# Patient Record
Sex: Female | Born: 1955 | Race: White | Hispanic: No | Marital: Married | Smoking: Current every day smoker
Health system: Southern US, Community
[De-identification: ages and names within clinical notes are randomized; demographics above are authoritative.]

## PROBLEM LIST (undated history)

## (undated) DIAGNOSIS — I700 Atherosclerosis of aorta: Secondary | ICD-10-CM

## (undated) DIAGNOSIS — E785 Hyperlipidemia, unspecified: Secondary | ICD-10-CM

## (undated) DIAGNOSIS — J439 Emphysema, unspecified: Secondary | ICD-10-CM

## (undated) DIAGNOSIS — M1990 Unspecified osteoarthritis, unspecified site: Secondary | ICD-10-CM

## (undated) DIAGNOSIS — F17200 Nicotine dependence, unspecified, uncomplicated: Secondary | ICD-10-CM

## (undated) DIAGNOSIS — Z8489 Family history of other specified conditions: Secondary | ICD-10-CM

## (undated) DIAGNOSIS — H539 Unspecified visual disturbance: Secondary | ICD-10-CM

## (undated) DIAGNOSIS — F419 Anxiety disorder, unspecified: Secondary | ICD-10-CM

## (undated) DIAGNOSIS — D472 Monoclonal gammopathy: Secondary | ICD-10-CM

## (undated) DIAGNOSIS — R112 Nausea with vomiting, unspecified: Secondary | ICD-10-CM

## (undated) DIAGNOSIS — F329 Major depressive disorder, single episode, unspecified: Secondary | ICD-10-CM

## (undated) DIAGNOSIS — I341 Nonrheumatic mitral (valve) prolapse: Secondary | ICD-10-CM

## (undated) DIAGNOSIS — F3341 Major depressive disorder, recurrent, in partial remission: Secondary | ICD-10-CM

## (undated) DIAGNOSIS — I10 Essential (primary) hypertension: Secondary | ICD-10-CM

## (undated) DIAGNOSIS — G4730 Sleep apnea, unspecified: Secondary | ICD-10-CM

## (undated) DIAGNOSIS — M5090 Cervical disc disorder, unspecified, unspecified cervical region: Secondary | ICD-10-CM

## (undated) DIAGNOSIS — Z9889 Other specified postprocedural states: Secondary | ICD-10-CM

## (undated) DIAGNOSIS — D890 Polyclonal hypergammaglobulinemia: Principal | ICD-10-CM

## (undated) DIAGNOSIS — T8859XA Other complications of anesthesia, initial encounter: Secondary | ICD-10-CM

## (undated) DIAGNOSIS — R7303 Prediabetes: Secondary | ICD-10-CM

## (undated) DIAGNOSIS — K219 Gastro-esophageal reflux disease without esophagitis: Secondary | ICD-10-CM

## (undated) DIAGNOSIS — T4145XA Adverse effect of unspecified anesthetic, initial encounter: Secondary | ICD-10-CM

## (undated) DIAGNOSIS — F32A Depression, unspecified: Secondary | ICD-10-CM

## (undated) DIAGNOSIS — G43909 Migraine, unspecified, not intractable, without status migrainosus: Secondary | ICD-10-CM

## (undated) HISTORY — DX: Nicotine dependence, unspecified, uncomplicated: F17.200

## (undated) HISTORY — DX: Polyclonal hypergammaglobulinemia: D89.0

## (undated) HISTORY — DX: Cervical disc disorder, unspecified, unspecified cervical region: M50.90

## (undated) HISTORY — PX: CARPAL TUNNEL RELEASE: SHX101

## (undated) HISTORY — DX: Major depressive disorder, recurrent, in partial remission: F33.41

## (undated) HISTORY — DX: Atherosclerosis of aorta: I70.0

## (undated) HISTORY — PX: TUBAL LIGATION: SHX77

## (undated) HISTORY — DX: Sleep apnea, unspecified: G47.30

## (undated) HISTORY — DX: Emphysema, unspecified: J43.9

## (undated) HISTORY — DX: Major depressive disorder, single episode, unspecified: F32.9

## (undated) HISTORY — DX: Prediabetes: R73.03

## (undated) HISTORY — PX: BREAST BIOPSY: SHX20

## (undated) HISTORY — DX: Monoclonal gammopathy: D47.2

## (undated) HISTORY — DX: Unspecified visual disturbance: H53.9

## (undated) HISTORY — PX: TONSILECTOMY, ADENOIDECTOMY, BILATERAL MYRINGOTOMY AND TUBES: SHX2538

## (undated) HISTORY — DX: Depression, unspecified: F32.A

## (undated) HISTORY — DX: Hyperlipidemia, unspecified: E78.5

## (undated) HISTORY — DX: Unspecified osteoarthritis, unspecified site: M19.90

## (undated) HISTORY — DX: Gastro-esophageal reflux disease without esophagitis: K21.9

## (undated) HISTORY — DX: Migraine, unspecified, not intractable, without status migrainosus: G43.909

## (undated) HISTORY — PX: KNEE ARTHROSCOPY: SHX127

## (undated) HISTORY — DX: Nonrheumatic mitral (valve) prolapse: I34.1

## (undated) HISTORY — DX: Essential (primary) hypertension: I10

## (undated) HISTORY — PX: MENISCUS REPAIR: SHX5179

## (undated) HISTORY — PX: BUNIONECTOMY: SHX129

## (undated) HISTORY — PX: OTHER SURGICAL HISTORY: SHX169

---
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
## 1980-07-07 DIAGNOSIS — I341 Nonrheumatic mitral (valve) prolapse: Secondary | ICD-10-CM

## 1980-07-07 HISTORY — DX: Nonrheumatic mitral (valve) prolapse: I34.1

## 1984-07-07 HISTORY — PX: BREAST EXCISIONAL BIOPSY: SUR124

## 1996-07-07 HISTORY — PX: BREAST EXCISIONAL BIOPSY: SUR124

## 2000-03-30 ENCOUNTER — Encounter: Payer: Self-pay | Admitting: Family Medicine

## 2000-03-30 ENCOUNTER — Ambulatory Visit (HOSPITAL_COMMUNITY): Admission: RE | Admit: 2000-03-30 | Discharge: 2000-03-30 | Payer: Self-pay | Admitting: Family Medicine

## 2000-06-05 ENCOUNTER — Ambulatory Visit (HOSPITAL_COMMUNITY): Admission: RE | Admit: 2000-06-05 | Discharge: 2000-06-05 | Payer: Self-pay | Admitting: Gastroenterology

## 2001-05-04 ENCOUNTER — Other Ambulatory Visit: Admission: RE | Admit: 2001-05-04 | Discharge: 2001-05-04 | Payer: Self-pay | Admitting: Internal Medicine

## 2002-04-13 ENCOUNTER — Encounter: Payer: Self-pay | Admitting: Internal Medicine

## 2002-04-13 ENCOUNTER — Encounter: Admission: RE | Admit: 2002-04-13 | Discharge: 2002-04-13 | Payer: Self-pay | Admitting: Internal Medicine

## 2002-07-02 ENCOUNTER — Encounter: Payer: Self-pay | Admitting: Internal Medicine

## 2002-07-02 ENCOUNTER — Encounter: Admission: RE | Admit: 2002-07-02 | Discharge: 2002-07-02 | Payer: Self-pay | Admitting: Internal Medicine

## 2002-08-11 ENCOUNTER — Emergency Department (HOSPITAL_COMMUNITY): Admission: EM | Admit: 2002-08-11 | Discharge: 2002-08-11 | Payer: Self-pay | Admitting: Emergency Medicine

## 2002-08-11 ENCOUNTER — Encounter: Payer: Self-pay | Admitting: Emergency Medicine

## 2003-12-21 ENCOUNTER — Other Ambulatory Visit: Admission: RE | Admit: 2003-12-21 | Discharge: 2003-12-21 | Payer: Self-pay | Admitting: Obstetrics and Gynecology

## 2004-05-07 HISTORY — PX: TOTAL VAGINAL HYSTERECTOMY: SHX2548

## 2004-05-27 ENCOUNTER — Encounter (INDEPENDENT_AMBULATORY_CARE_PROVIDER_SITE_OTHER): Payer: Self-pay | Admitting: Specialist

## 2004-05-27 ENCOUNTER — Observation Stay (HOSPITAL_COMMUNITY): Admission: RE | Admit: 2004-05-27 | Discharge: 2004-05-28 | Payer: Self-pay | Admitting: Obstetrics and Gynecology

## 2004-09-26 ENCOUNTER — Encounter: Admission: RE | Admit: 2004-09-26 | Discharge: 2004-12-25 | Payer: Self-pay | Admitting: Internal Medicine

## 2004-10-17 ENCOUNTER — Encounter: Admission: RE | Admit: 2004-10-17 | Discharge: 2004-10-17 | Payer: Self-pay | Admitting: Internal Medicine

## 2004-12-05 HISTORY — PX: CERVICAL DISC SURGERY: SHX588

## 2004-12-06 ENCOUNTER — Encounter: Admission: RE | Admit: 2004-12-06 | Discharge: 2004-12-06 | Payer: Self-pay | Admitting: Neurosurgery

## 2004-12-18 ENCOUNTER — Ambulatory Visit (HOSPITAL_COMMUNITY): Admission: RE | Admit: 2004-12-18 | Discharge: 2004-12-19 | Payer: Self-pay | Admitting: Neurosurgery

## 2005-01-25 ENCOUNTER — Encounter: Admission: RE | Admit: 2005-01-25 | Discharge: 2005-01-25 | Payer: Self-pay | Admitting: Neurosurgery

## 2005-02-12 ENCOUNTER — Ambulatory Visit (HOSPITAL_BASED_OUTPATIENT_CLINIC_OR_DEPARTMENT_OTHER): Admission: RE | Admit: 2005-02-12 | Discharge: 2005-02-12 | Payer: Self-pay | Admitting: Orthopedic Surgery

## 2005-02-12 ENCOUNTER — Ambulatory Visit (HOSPITAL_COMMUNITY): Admission: RE | Admit: 2005-02-12 | Discharge: 2005-02-12 | Payer: Self-pay | Admitting: Orthopedic Surgery

## 2005-05-27 ENCOUNTER — Encounter: Admission: RE | Admit: 2005-05-27 | Discharge: 2005-08-25 | Payer: Self-pay | Admitting: Neurosurgery

## 2006-04-21 ENCOUNTER — Other Ambulatory Visit: Admission: RE | Admit: 2006-04-21 | Discharge: 2006-04-21 | Payer: Self-pay | Admitting: Obstetrics and Gynecology

## 2006-04-21 LAB — HM PAP SMEAR: HM Pap smear: NORMAL

## 2008-06-16 ENCOUNTER — Encounter: Admission: RE | Admit: 2008-06-16 | Discharge: 2008-06-16 | Payer: Self-pay | Admitting: Internal Medicine

## 2010-11-22 NOTE — Op Note (Signed)
NAMELESSLY, STIGLER           ACCOUNT NO.:  [id number]   MEDICAL RECORD NO.:  [id number]          PATIENT TYPE:  OBV   LOCATION:  9399                          FACILITY:  [facility]   PHYSICIAN:  Cynthia P. Romine, M.D.DATE OF BIRTH:  [birth year]   DATE OF PROCEDURE:  05/27/2004  DATE OF DISCHARGE:                                 OPERATIVE REPORT   PREOPERATIVE DIAGNOSIS:  Uterine prolapse.   POSTOPERATIVE DIAGNOSIS:  Uterine prolapse, pathology pending.   PROCEDURE:  Total vaginal hysterectomy.   SURGEON:  Cynthia P. Romine, M.D.   ASSISTANT:  Andres Ege, M.D.   ANESTHESIA:  General endotracheal.   ESTIMATED BLOOD LOSS:  50 mL.   COMPLICATIONS:  None.   PROCEDURE:  The patient was taken to the operating room and after the  induction of adequate general anesthesia was placed in a dorsal lithotomy  position and prepped and draped in the usual fashion.  The bladder was  drained with a red rubber catheter.  A posterior weighted and anterior Sims  retractor were placed and the cervix was grasped on its anterior lip with a  Jacobs tenaculum.  The mucosa was infiltrated with approximately 8 mL of 1%  Xylocaine with epinephrine.  A knife was used to incise the mucosa off the  cervix and the bladder was moved anteriorly using sharp and blunt  dissection.  Attention was next turned posteriorly.  A posterior colpotomy  incision was made and the Bonnano retractor was placed into the posterior  peritoneal space.  The uterosacral ligaments were clamped, cut, and tied on  each side.  Attention was next turned anteriorly.  The anterior peritoneum  was identified and initial incision into the peritoneum revealed it not to  be into the peritoneal cavity.  Re-evaluation revealed the true fold, and it  was elevated and the anterior cavity was entered without difficulty.  The  Deaver retractor was placed.  The cardinal ligaments were then clamped, cut,  and tied using Heaney clamps and 0  chromic.  The pedicles containing the  uterine arteries were clamped, cut, and tied.  The fundus was then delivered  posteriorly and the pedicle containing the utero-ovarian ligament to the  round ligament was clamped, cut, and doubly tied.  The tubes had been  previously ligated and the proximal end of each tube was noted to be with a  slight hydrosalpinx, so the proximal end of the tube was included in the  pedicle and excised on each side.  The vaginal cuff was then run from 2  o'clock to 10 o'clock with a running locking suture of 0 chromic.  An anti-  enterocele stitch was then placed, pulling together the two uterosacral  ligaments and tied.  The wound was then irrigated and felt to be hemostatic.  The bladder was filled with sterile saline tinted with indigo carmine to  assure that the bladder was intact, and it was.  There was no leaking of  blue fluid into the wound, and the same amount of fluid that was put in was  taken out through the Foley at the completion of  irrigation.  The wound was then irrigated again with sterile saline.  The  cuff was closed with interrupted figure-of-eight sutures using 0 chromic.  The cuff was again irrigated and the procedure was terminated.  The  instruments were removed from the vagina, and the patient was taken to the  recovery room in satisfactory condition.      CPR/MEDQ  D:  05/27/2004  T:  05/27/2004  Job:  [id number]

## 2010-11-22 NOTE — Op Note (Signed)
[name], [name]           ACCOUNT NO.:  [id number]   MEDICAL RECORD NO.:  [id number]          PATIENT TYPE:  OIB   LOCATION:  3004                         FACILITY:  [facility]   PHYSICIAN:  Coletta Memos, M.D.     DATE OF BIRTH:  February 16, 1956   DATE OF PROCEDURE:  12/18/2004  DATE OF DISCHARGE:  12/19/2004                                 OPERATIVE REPORT   PREOPERATIVE DIAGNOSES:  1.  Cervical spondylosis.  2.  C5-6 cervical degenerative disk disease, C5-6.   POSTOPERATIVE DIAGNOSES:  1.  Cervical spondylosis.  2.  C5-6 cervical degenerative disk disease, C5-6.   PROCEDURES:  1.  Anterior cervical decompression, C5-6.  2.  Arthrodesis, C5-6, with 7-mm allograft anterior plating, ACCS plate.   COMPLICATIONS:  None.   DISCHARGE STATUS:  Alive and well.   SURGEON:  Coletta Memos, M.D.   ASSISTANT:  None.   INDICATIONS:  [name] is a 55 year old woman who presented with a  long history of pain in the cervical spine secondary to what I believe was  significant degenerative disk disease at C5-6.  She also had left arm pain  and a cervical radiculopathy.  I recommended and she agreed to undergo  operative decompression.   OPERATIVE NOTE:  [name] was brought to the operating room, intubated  and placed under general anesthetic without difficulty.  Her head was placed  in slight extension on a horseshoe headrest.  No traction was used.  Her neck was prepped, and she was draped in a sterile fashion.  I  infiltrated 0.5% lidocaine and 1:200,000 epinephrine starting in the midline  and extended through the medial border of the left sternocleidomastoid.  I  opened the skin with a #10 blade and took this down to the platysma.  I  dissected superior to the platysma rostrally and caudally.  I then opened  the platysma in a horizontal fashion.  I then dissected inferior to platysma  rostrally and caudally.  I created an avascular path to the anterior  cervical spine.  I  placed spinal needle and showed the disk space.  Then  using that as a guide, I was able moved to the correct disk space at the C5-  6.  Then I infiltrated 4 mL of the 0.5% lidocaine and 1:200,000 strength  epinephrine.  It showed that I had a metallic probe at the C5-6 interspace.  I then reflected the longus colli muscles bilaterally at C5-6, placing a  self-retaining retractor.  O then opened the disk space to mark it.  I then  placed distraction pins, one atC5, the other in C6.  I then proceeded with  the diskectomy using pituitary rongeurs, high-speed drill and Kerrison  punches.  I removed osteophytes, disk material and posterior longitudinal  ligament.  As well, I decompressed very well the C6 nerve roots bilaterally.  I then ensured hemostasis.  I then placed an 8-mm graft.  This was shaved  down to fit in the space.  I then removed the distraction pins.  I then  placed an ACCS plate overlying the disk space and placed four screws,  two in  C5, two in C6.  Plate __________  screws were then seen on x-ray, and they were in good position.  I then  closed the wound in a layered fashion using Vicryl sutures, reapproximated  platysma and subcutaneous tissues.  Dermabond was used for a sterile  dressing.  The patient tolerated the procedure well.       KC/MEDQ  D:  01/24/2005  T:  01/24/2005  Job:  [id number]

## 2010-11-22 NOTE — Op Note (Signed)
NAMEDANNYA, PITKIN           ACCOUNT NO.:  [id number]   MEDICAL RECORD NO.:  [id number]          PATIENT TYPE:  AMB   LOCATION:  DSC                          FACILITY:  [facility]   PHYSICIAN:  Mila Homer. Sherlean Foot, M.D. DATE OF BIRTH:  20-Nov-1955   DATE OF PROCEDURE:  02/12/2005  DATE OF DISCHARGE:                                 OPERATIVE REPORT   SURGEON:  Mila Homer. Sherlean Foot, M.D.   ASSISTANT:  None.   PREOPERATIVE DIAGNOSIS:  Left shoulder impingement syndrome.   POSTOPERATIVE DIAGNOSIS:  Left shoulder impingement syndrome.   PROCEDURE:  Left shoulder arthroscopy, subacromial decompression,  glenohumeral debridement, and distal clavicle resection.   INDICATIONS FOR PROCEDURE:  The patient is a 55 year old white female with  failure of conservative measures for shoulder impingement syndrome.  This  was acute on chronic injury.  Informed written consent was obtained.   DESCRIPTION OF PROCEDURE:  The patient was laid supine, administered general  anesthesia, and placed in the beach chair position.  The left shoulder was  prepped and draped in the usual sterile fashion.  Inferolateral and  inferomedial portals were created with a #11 blade, blunt trocar, and  cannula.  Diagnostic arthroscopy revealed some degenerative anterior labral  tearing and some synovitis.  The 3.5 Gator shaver was used to debride the  anterior labrum, the under surface of the rotator cuff, and remove the  synovitis in the posterior aspect of the joint.  I then went into the  subacromial space and from the direct lateral portal, inserted the 3.5 Gator  shaver and performed a bursectomy.  I then used the ArthroCare debridement  wand to debride off the under surface of the acromion and release the CA  ligament.  I then performed an anterolateral acromioplasty and a distal  clavicle resection with a 4 mm cylindrical bur.  Once adequate decompression  was obtained and hemostasis was obtained, I then evacuated the  subacromial  space of the fluid and instruments, closed with 4-0 nylon sutures, and  dressed with Adaptic, 4 by 4s, ABDs, 2 inch silk tape, and simple sling.  Complications none.  Drains none.       SDL/MEDQ  D:  02/12/2005  T:  02/12/2005  Job:  [id number]

## 2010-11-22 NOTE — Op Note (Signed)
NAMEGARNETT, [name]           ACCOUNT NO.:  [id number]   MEDICAL RECORD NO.:  [id number]          PATIENT TYPE:  OIB   LOCATION:  3004                         FACILITY:  [facility]   PHYSICIAN:  Coletta Memos, M.D.     DATE OF BIRTH:  1956/06/24   DATE OF PROCEDURE:  12/18/2004  DATE OF DISCHARGE:                                 OPERATIVE REPORT   PREOPERATIVE DIAGNOSIS:  1.  Cervical spondylosis without myelopathy.  2.  Cervical degenerative disk disease.  3.  Cervical pain.  4.  Cervical radiculopathy.   POSTOPERATIVE DIAGNOSES:  1.  Cervical spondylosis without myelopathy.  2.  Cervical degenerative disk disease.  3.  Cervical pain.  4.  Cervical radiculopathy.   PROCEDURE:  1.  Anterior cervical decompression C5-6.  2.  Arthrodesis C5-6 with Synthes bone graft drilled to fit this space.  3.  Anterior plating using ACCS plate, 37 ACCS plate.   COMPLICATIONS:  None.   SURGEON:  Coletta Memos, M.D.   ASSISTANT.:  Hewitt Shorts, M.D.   INDICATIONS:  [name] is a 55 year old who presents with a long  history of neck pain and occasional arm pain. CT and MRI revealed  spondylitic changes at C5-6. I therefore recommended and she agreed to  undergo operative decompression.   OPERATIVE NOTE:  [name] was brought to the operating room, intubated  and placed under general anesthetic without difficulty. Head was positioned  in slight extension on a horseshoe head rest. The neck was prepped, she was  draped in sterile fashion. I infiltrated 4 mL 0.5% lidocaine 1:200,000  strength epinephrine starting from the medial border of the left  sternocleidomastoid to the midline at the level of the cricoid cartilage. I  opened the skin with #10 blade and took that down to the platysma. I then  dissected superior to the platysma rostrally and caudally. I opened the  platysma in horizontal fashion. I then dissected rostrally and caudally  inferior to the platysma. I then  created an avascular plane to the anterior  cervical spine. Placed self-retaining retractors. I then opened the disk  space, placed distraction pins one at C5 the other at C6. Using a high-speed  drill, Kerrison punches, pituitary rongeurs, I performed the diskectomy and  then decompressed both the C6 nerve roots bilaterally. That was done without  difficulty. I then placed a bone graft into the disk space, removed the  distraction pins, and then placed a  plate, two screws of C5 and two at C6. Before I opened the disk space, I  placed a needle into the disk space and x-ray showed I was at C5-6 and plate  was placed in C5-6. I irrigated the wound. Then closed with a layered  fashion using Vicryl sutures to reapproximate the platysma and subcutaneous  tissues. The patient tolerated procedure well.       KC/MEDQ  D:  12/18/2004  T:  12/19/2004  Job:  [id number]

## 2011-08-13 ENCOUNTER — Telehealth: Payer: Self-pay | Admitting: Oncology

## 2011-08-13 NOTE — Telephone Encounter (Signed)
called pt lmovm to rtn call to scheduled appt for 02/20

## 2011-08-13 NOTE — Telephone Encounter (Signed)
pt rtn calland scheduled appt for 02/20.  will fax over a letter to Dr. Durenda Age with appt d/t

## 2011-08-14 ENCOUNTER — Telehealth: Payer: Self-pay | Admitting: Oncology

## 2011-08-14 NOTE — Telephone Encounter (Signed)
Del.Chart °

## 2011-08-18 ENCOUNTER — Encounter: Payer: Self-pay | Admitting: Oncology

## 2011-08-18 DIAGNOSIS — E785 Hyperlipidemia, unspecified: Secondary | ICD-10-CM | POA: Insufficient documentation

## 2011-08-18 DIAGNOSIS — I341 Nonrheumatic mitral (valve) prolapse: Secondary | ICD-10-CM | POA: Insufficient documentation

## 2011-08-18 DIAGNOSIS — M1990 Unspecified osteoarthritis, unspecified site: Secondary | ICD-10-CM | POA: Insufficient documentation

## 2011-08-18 DIAGNOSIS — I10 Essential (primary) hypertension: Secondary | ICD-10-CM | POA: Insufficient documentation

## 2011-08-27 ENCOUNTER — Other Ambulatory Visit: Payer: PRIVATE HEALTH INSURANCE | Admitting: Lab

## 2011-08-27 ENCOUNTER — Ambulatory Visit (HOSPITAL_BASED_OUTPATIENT_CLINIC_OR_DEPARTMENT_OTHER): Payer: PRIVATE HEALTH INSURANCE | Admitting: Oncology

## 2011-08-27 ENCOUNTER — Telehealth: Payer: Self-pay | Admitting: Oncology

## 2011-08-27 ENCOUNTER — Encounter: Payer: Self-pay | Admitting: Oncology

## 2011-08-27 ENCOUNTER — Ambulatory Visit: Payer: PRIVATE HEALTH INSURANCE

## 2011-08-27 DIAGNOSIS — I059 Rheumatic mitral valve disease, unspecified: Secondary | ICD-10-CM

## 2011-08-27 DIAGNOSIS — I341 Nonrheumatic mitral (valve) prolapse: Secondary | ICD-10-CM

## 2011-08-27 DIAGNOSIS — I10 Essential (primary) hypertension: Secondary | ICD-10-CM

## 2011-08-27 DIAGNOSIS — M1990 Unspecified osteoarthritis, unspecified site: Secondary | ICD-10-CM

## 2011-08-27 DIAGNOSIS — E785 Hyperlipidemia, unspecified: Secondary | ICD-10-CM

## 2011-08-27 DIAGNOSIS — D472 Monoclonal gammopathy: Secondary | ICD-10-CM

## 2011-08-27 LAB — CBC WITH DIFFERENTIAL/PLATELET
BASO%: 0.5 % (ref 0.0–2.0)
Basophils Absolute: 0 10*3/uL (ref 0.0–0.1)
Eosinophils Absolute: 0.2 10*3/uL (ref 0.0–0.5)
LYMPH%: 41.2 % (ref 14.0–49.7)
MCV: 90.4 fL (ref 79.5–101.0)
RBC: 4.36 10*6/uL (ref 3.70–5.45)
WBC: 8.3 10*3/uL (ref 3.9–10.3)

## 2011-08-27 NOTE — Telephone Encounter (Signed)
Gv pt appt for june2013.  scheduled pt for bone survey on 02/25 @ [facility]

## 2011-08-27 NOTE — Progress Notes (Signed)
[facility] INITIAL HEMATOLOGY CONSULTATION  Referral MD:   Dr. Pollyann Savoy, M.D.  Reason for Referral: positive M-spike on SPEP.     HPI: [name] is a 56 year-old woman with multiple medical issues specially osteoarthritis.  She had history of right knee arthroscopic surgery x 2 in the past.  She developed right knee meniscal tear in September 2012 when she developed more right knee discomfort. She underwent evaluation by Dr. Sherlean Foot and had surgery for the right meniscal tear. Every since her surgery she thought that her right knee pain has been getting worse. In addition she also developed posterior neck pain. She had history of cervical disc disease and underwent many years ago searching and neck. However for the last few months her neck is been causing her more pain. She should has mild intermittent numbness in her bilateral hands specialty when she raises her arms up above her head.  She was evaluated by Dr. Corliss Skains and was ruled out for rheumatoid arthritis. Dr. Corliss Skains thought that her presentation was more consistent with osteoarthritis. A serum protein electrophoresis was sent and was positive for M spike of 0.21 g/dL. However immunoglobulin levels were all within normal range. She was kindly referred to the Cancer cCnter for evaluation.   [name] presented to the clinic for the first time with her partner.  She reported that she's been having right knee pain that is worse with ambulation. She has not noticed any increase in temperature, opened skin, purulent discharge from the right knee.  She still has moderate neck pain.  She said there are days when she does not want to get out of bed in the morning because of this symptoms.  She otherwise has normal appetite and denies any weight loss. She denies fatigue, anorexia, mucositis, nausea vomiting, bleeding symptoms.  She is a chronic posterior neck headaches, for many years radiating to the front bilaterally  associated with some time with visual aura. This headache is worse with loud noise or bright light.  Patient denies confusion, drenching night sweats, palpable lymph node swelling, mucositis, odynophagia, dysphagia, nausea vomiting, jaundice, chest pain, palpitation, shortness of breath, dyspnea on exertion, productive cough, gum bleeding, epistaxis, hematemesis, hemoptysis, abdominal pain, abdominal swelling, early satiety, melena, hematochezia, hematuria, skin rash, spontaneous bleeding, joint swelling, heat or cold intolerance, bowel bladder incontinence, focal motor weakness, paresthesia, depression, suicidal or homocidal ideation, feeling hopelessness.   Past Medical History  Diagnosis Date  . Hyperlipidemia   . HTN (hypertension)   . Mitral valve prolapse   . GERD (gastroesophageal reflux disease)   . Depression   . Osteoarthritis   . Monoclonal (M) protein disease, multiple 'M' protein   . Cervical disc disease   . Migraine headache   :    Past Surgical History  Procedure Date  . Knee arthroscopy     right knee x 2  . Meniscus repair     right knee x 1  . Cervical disc surgery   . Tonsilectomy, adenoidectomy, bilateral myringotomy and tubes   . Breast biopsy     negative x 2   . Appendectomy   . Tubal ligation   . Partial hysterectomy   . Bunionectomy   :   CURRENT MEDS: Current Outpatient Prescriptions  Medication Sig Dispense Refill  . acetaminophen (TYLENOL) 500 MG tablet Take 500 mg by mouth every 6 (six) hours as needed. Take 1000 mg daily      . ALPRAZolam (XANAX) 0.25 MG tablet Take  0.25 mg by mouth at bedtime as needed.      . desvenlafaxine (PRISTIQ) 100 MG 24 hr tablet Take 100 mg by mouth daily.      Marland Kitchen estradiol (VIVELLE-DOT) 0.0375 MG/24HR Place 1 patch onto the skin. Every 8 days      . ibuprofen (ADVIL,MOTRIN) 200 MG tablet Take 200 mg by mouth every 6 (six) hours as needed. Takes 1200- 1800 mg daily      . magnesium gluconate (MAGONATE) 30 MG tablet  Take 30 mg by mouth. 3 tablets daily      . metoprolol (LOPRESSOR) 50 MG tablet Take 50 mg by mouth daily.      Marland Kitchen OVER THE COUNTER MEDICATION daily. Simplex-F  Take 1 tablet daily      . pantoprazole (PROTONIX) 40 MG tablet Take 40 mg by mouth daily.      . rosuvastatin (CRESTOR) 10 MG tablet Take 10 mg by mouth daily.      . Vitamin D, Ergocalciferol, (DRISDOL) 50000 UNITS CAPS Take 5,000 Units by mouth daily.          Allergies no known allergies:  Family History  Problem Relation Age of Onset  . Heart disease Mother   . Heart disease Father   . Cancer Brother     lung cancer  . Cancer Brother     lung cancer  :  History   Social History  . Marital Status: Single    Spouse Name: N/A    Number of Children: 2  . Years of Education: N/A   Occupational History  .      Intake coordinator   Social History Main Topics  . Smoking status: Current Everyday Smoker -- 1.0 packs/day for 35 years  . Smokeless tobacco: Never Used  . Alcohol Use: No  . Drug Use: No  . Sexually Active: No   Other Topics Concern  . Not on file   Social History Narrative  . No narrative on file    REVIEW OF SYSTEM:  The rest of the 14-point review of sytem was negative.   Exam:  General:  well-nourished in no acute distress.  Eyes:  no scleral icterus.  ENT:  There were no oropharyngeal lesions.  Neck was without thyromegaly.  Lymphatics:  Negative cervical, supraclavicular or axillary adenopathy.  Respiratory: lungs were clear bilaterally without wheezing or crackles.  Cardiovascular:  Regular rate and rhythm, S1/S2, without murmur, rub or gallop.  There was no pedal edema.  GI:  abdomen was soft, flat, nontender, nondistended, without organomegaly.  Muscoloskeletal:  no spinal tenderness of palpation of vertebral spine.  I could not palpate any abnormal mass or pain in the neck or right knee.  Skin exam was without echymosis, petichae.  Neuro exam was nonfocal.  Patient was able to get on and  off exam table without assistance.  Gait was normal.  Patient was alerted and oriented.  Attention was good.   Language was appropriate.  Mood was normal without depression.  Speech was not pressured.  Thought content was not tangential.    LABS:  Lab Results  Component Value Date   WBC 8.3 08/27/2011   HGB 13.3 08/27/2011   HCT 39.4 08/27/2011   PLT 354 08/27/2011   GLUCOSE 113* 08/27/2011   ALT 18 08/27/2011   AST 21 08/27/2011   NA 138 08/27/2011   K 4.4 08/27/2011   CL 103 08/27/2011   CREATININE 0.80 08/27/2011   BUN 14 08/27/2011   CO2  25 08/27/2011   ASSESSMENT AND PLAN:   1.  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  Her quantitative Ig's levels are normal.  She does not have anemia, renal insufficiency, or hypercalcemia.  Thus, she does not meet criteria for active myeloma.  However, I need to complete work up by sending for skeletal survey to rule out lytic lesion.  If she does have lytic lesion, then I will work up with bone marrow biopsy.  I will also need to rule out amyloidosis by 24 hour urine collection to ensure that she does not have nephrotic range proteinuria which is a marker to work up for amyloidosis.   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 This is benign condition most that time.  About 1% of patients MGUS per year progress into active myeloma. Given the low rate of transformation to myeloma from MGUS we do not treat MGUS patients with any therapy except for watchful observation. I recommended watchful observation with CBC, CMET, SPEP, serum light chains in 4 months. In the future if she develops worsening of her M spike or any of the marker  endorgan damage, then I will  proceed with further workup at that time.   2. OA:  On Ibuprofen and Tylenol as needed.  3.  Headache:   Consistent with migraine.  I advised her to see her PCP to determine of triptans are appropriate for her.  4.  HTN:   She is on Metoprolol per PCP.   5.  HLP:  She is on Crestor per PCP.   6.  Depression:  She is on Prigtig per PCP.   7.  Follow up in 4 months.   The length of time of the face-to-face encounter was 45 minutes. More than 50% of time was spent counseling and coordination of care.

## 2011-08-28 LAB — COMPREHENSIVE METABOLIC PANEL
AST: 21 U/L (ref 0–37)
Albumin: 4.6 g/dL (ref 3.5–5.2)
Alkaline Phosphatase: 71 U/L (ref 39–117)
BUN: 14 mg/dL (ref 6–23)
CO2: 25 mEq/L (ref 19–32)
Calcium: 10 mg/dL (ref 8.4–10.5)
Creatinine, Ser: 0.8 mg/dL (ref 0.50–1.10)
Potassium: 4.4 mEq/L (ref 3.5–5.3)
Total Bilirubin: 0.3 mg/dL (ref 0.3–1.2)

## 2011-08-28 LAB — KAPPA/LAMBDA LIGHT CHAINS
Kappa free light chain: 0.94 mg/dL (ref 0.33–1.94)
Lambda Free Lght Chn: 0.72 mg/dL (ref 0.57–2.63)

## 2011-09-01 ENCOUNTER — Telehealth: Payer: Self-pay | Admitting: *Deleted

## 2011-09-01 ENCOUNTER — Ambulatory Visit: Payer: PRIVATE HEALTH INSURANCE

## 2011-09-01 ENCOUNTER — Other Ambulatory Visit: Payer: Self-pay | Admitting: *Deleted

## 2011-09-01 ENCOUNTER — Other Ambulatory Visit: Payer: Self-pay | Admitting: Oncology

## 2011-09-01 ENCOUNTER — Ambulatory Visit (HOSPITAL_COMMUNITY)
Admission: RE | Admit: 2011-09-01 | Discharge: 2011-09-01 | Disposition: A | Discharge status: Home / Self Care | Payer: PRIVATE HEALTH INSURANCE | Source: Ambulatory Visit | Attending: Oncology | Admitting: Oncology

## 2011-09-01 DIAGNOSIS — M1990 Unspecified osteoarthritis, unspecified site: Secondary | ICD-10-CM

## 2011-09-01 DIAGNOSIS — M899 Disorder of bone, unspecified: Secondary | ICD-10-CM | POA: Insufficient documentation

## 2011-09-01 NOTE — Telephone Encounter (Signed)
Per Dr. Gaylyn Rong,  Bone Marrow Bx tomorrow,  Prefers 8 am..  Unable to schedule for 8 am as Flow Cytology not avail until 9am.  Scheduled for 9 am w/ lab at 830am.  Notified Magaret in Flow Cytology and scheduled by Dory Peru.  Dr. Gaylyn Rong notified.  Called pt with instructions date and time for tomorrow.  She verbalized understanding.

## 2011-09-02 ENCOUNTER — Ambulatory Visit: Payer: PRIVATE HEALTH INSURANCE | Admitting: Oncology

## 2011-09-02 ENCOUNTER — Other Ambulatory Visit (HOSPITAL_COMMUNITY)
Admission: RE | Admit: 2011-09-02 | Discharge: 2011-09-02 | Disposition: A | Discharge status: Home / Self Care | Payer: PRIVATE HEALTH INSURANCE | Source: Ambulatory Visit | Attending: Oncology | Admitting: Oncology

## 2011-09-02 ENCOUNTER — Ambulatory Visit (HOSPITAL_BASED_OUTPATIENT_CLINIC_OR_DEPARTMENT_OTHER): Payer: PRIVATE HEALTH INSURANCE | Admitting: Oncology

## 2011-09-02 ENCOUNTER — Other Ambulatory Visit: Payer: PRIVATE HEALTH INSURANCE | Admitting: Lab

## 2011-09-02 VITALS — BP 129/82 | HR 76 | Temp 96.9°F

## 2011-09-02 DIAGNOSIS — D472 Monoclonal gammopathy: Secondary | ICD-10-CM | POA: Insufficient documentation

## 2011-09-02 DIAGNOSIS — M948X9 Other specified disorders of cartilage, unspecified sites: Secondary | ICD-10-CM | POA: Insufficient documentation

## 2011-09-02 DIAGNOSIS — M1990 Unspecified osteoarthritis, unspecified site: Secondary | ICD-10-CM | POA: Insufficient documentation

## 2011-09-02 LAB — CBC WITH DIFFERENTIAL/PLATELET
BASO%: 0.7 % (ref 0.0–2.0)
EOS%: 2.8 % (ref 0.0–7.0)
Eosinophils Absolute: 0.2 10*3/uL (ref 0.0–0.5)
HGB: 13.3 g/dL (ref 11.6–15.9)
LYMPH%: 39.1 % (ref 14.0–49.7)
Platelets: 337 10*3/uL (ref 145–400)
RBC: 4.36 10*6/uL (ref 3.70–5.45)
RDW: 12.1 % (ref 11.2–14.5)
lymph#: 3.2 10*3/uL (ref 0.9–3.3)

## 2011-09-02 LAB — CHCC SMEAR

## 2011-09-02 NOTE — Patient Instructions (Signed)
[facility] Discharge Instructions for Post Bone Marrow Procedure  Today you had a bone marrow biopsy and aspirate of lt hip. Please keep the pressure dressing in place for at least 24 hours.  Have someone check your dressing periodically for bleeding.  If needed you can reapply a pressure dressing to the site.  Take pain medication as directed.  IF BLEEDING REOCCURS THAT SHOULD BE REPORTED IMMEDIATELY. Call the Cancer Center at [phone] if during business hours. Or report to the Emergency Room.   I have been informed and understand all the instructions given to me. I know to contact the clinic, my physician, or go to the Emergency Department if any problems should occur. I do not have any questions at this time, but understand that I may call the clinic during office hours at [phone]  should I have any questions or need assistance in obtaining follow up care.    __________________________________________  _____________  __________ Signature of Patient or Authorized Representative            Date                   Time    __________________________________________ Nurse's Signature

## 2011-09-02 NOTE — Progress Notes (Signed)
Bone marrow biopsy and aspiration.  INDICATION:  Positive M-spike; probable lytic lesions on skeletal survey.   Procedure: After obtained from consent, [name] was placed in the prone position. Time out was performed verifying correct patient and procedure. The skin overlying the left posterior crest was prepped with Betadine and draped in the usual sterile fashion. The skin and periosteum were infiltrated with 30 mL of 2% lidocaine as she was still uncomfortable after the first 20mL. A small puncture wound was made with #11 scalpel blade.  Bone marrow aspirate was obtained on the second pass of the aspiration needle as the first pass was dry aspirate.  One core biopsy was obtained through the same incision.   The aspirate was sent for routine histology, flow cytometry, myeloma FISH panel and cytogenetics.  Core biopsy was sent for routine histology.   [name] tolerated procedure well with minimal  blood loss and without immediate complication.   A sterile dressing was applied.   Jethro Bolus M.D. 09/02/2011

## 2011-09-03 LAB — UIFE/LIGHT CHAINS/TP QN, 24-HR UR
Free Kappa/Lambda Ratio: 4.67 ratio (ref 2.04–10.37)
Free Lambda Excretion/Day: 0.78 mg/d
Time: 24 hours
Total Protein, Urine: 0.7 mg/dL

## 2011-09-08 ENCOUNTER — Telehealth: Payer: Self-pay | Admitting: *Deleted

## 2011-09-08 NOTE — Telephone Encounter (Signed)
MGUS does not cause fatigue and bone pain.  I will not be able to sign this paper.  She can find another hematologist/oncologist and see what they say.   Thanks.

## 2011-09-08 NOTE — Telephone Encounter (Signed)
Pt called c/o extreme fatigue and ongoing bone pain, making her unable to work. She wants to apply for Disability and asks if Dr. Gaylyn Rong will complete/sign paperwork for her?

## 2011-09-09 NOTE — Telephone Encounter (Signed)
Called pt back to inform her that her symptoms are not related to MGUS and therefore Dr. Gaylyn Rong will not be able to assist w/ Disability paperwork.  Suggested she call her PCP and continue to investigate the cause of her fatigue and bone pain.  She verbalized understanding.

## 2011-10-03 ENCOUNTER — Other Ambulatory Visit: Payer: Self-pay | Admitting: Chiropractic Medicine

## 2011-10-03 ENCOUNTER — Ambulatory Visit
Admission: RE | Admit: 2011-10-03 | Discharge: 2011-10-03 | Disposition: A | Discharge status: Home / Self Care | Payer: PRIVATE HEALTH INSURANCE | Source: Ambulatory Visit | Attending: Chiropractic Medicine | Admitting: Chiropractic Medicine

## 2011-10-03 DIAGNOSIS — M542 Cervicalgia: Secondary | ICD-10-CM

## 2011-10-03 MED ORDER — GADOBENATE DIMEGLUMINE 529 MG/ML IV SOLN
16.0000 mL | Freq: Once | INTRAVENOUS | Status: AC | PRN
  Administered 2011-10-03: 16 mL via INTRAVENOUS

## 2011-10-06 ENCOUNTER — Other Ambulatory Visit: Payer: PRIVATE HEALTH INSURANCE

## 2011-11-10 ENCOUNTER — Other Ambulatory Visit: Payer: Self-pay | Admitting: Neurosurgery

## 2011-11-10 DIAGNOSIS — M4802 Spinal stenosis, cervical region: Secondary | ICD-10-CM | POA: Insufficient documentation

## 2011-11-10 DIAGNOSIS — M542 Cervicalgia: Secondary | ICD-10-CM

## 2011-12-02 ENCOUNTER — Ambulatory Visit
Admission: RE | Admit: 2011-12-02 | Discharge: 2011-12-02 | Disposition: A | Discharge status: Home / Self Care | Payer: PRIVATE HEALTH INSURANCE | Source: Ambulatory Visit | Attending: Neurosurgery | Admitting: Neurosurgery

## 2011-12-02 VITALS — BP 153/87 | HR 54

## 2011-12-02 DIAGNOSIS — M542 Cervicalgia: Secondary | ICD-10-CM

## 2011-12-02 MED ORDER — TRIAMCINOLONE ACETONIDE 40 MG/ML IJ SUSP (RADIOLOGY)
60.0000 mg | Freq: Once | INTRAMUSCULAR | Status: AC
  Administered 2011-12-02: 60 mg via EPIDURAL

## 2011-12-02 MED ORDER — IOHEXOL 300 MG/ML  SOLN
1.0000 mL | Freq: Once | INTRAMUSCULAR | Status: AC | PRN
  Administered 2011-12-02: 1 mL via EPIDURAL

## 2011-12-02 NOTE — Discharge Instructions (Signed)

## 2011-12-03 ENCOUNTER — Telehealth: Payer: Self-pay | Admitting: Oncology

## 2011-12-03 NOTE — Telephone Encounter (Signed)
Changed time of 6/6 lb to 2pm due to survivor day. lmonvm informing pt and mailed new schedule.

## 2011-12-11 ENCOUNTER — Other Ambulatory Visit (HOSPITAL_BASED_OUTPATIENT_CLINIC_OR_DEPARTMENT_OTHER): Payer: PRIVATE HEALTH INSURANCE | Admitting: Lab

## 2011-12-11 DIAGNOSIS — D472 Monoclonal gammopathy: Secondary | ICD-10-CM

## 2011-12-11 LAB — CBC WITH DIFFERENTIAL/PLATELET
Eosinophils Absolute: 0.1 10*3/uL (ref 0.0–0.5)
MCV: 89.9 fL (ref 79.5–101.0)
MONO%: 6.3 % (ref 0.0–14.0)
NEUT#: 6.7 10*3/uL — ABNORMAL HIGH (ref 1.5–6.5)
RBC: 4.47 10*6/uL (ref 3.70–5.45)
RDW: 12.5 % (ref 11.2–14.5)
WBC: 12.2 10*3/uL — ABNORMAL HIGH (ref 3.9–10.3)
nRBC: 0 % (ref 0–0)

## 2011-12-15 LAB — COMPREHENSIVE METABOLIC PANEL
ALT: 9 U/L (ref 0–35)
AST: 11 U/L (ref 0–37)
Alkaline Phosphatase: 70 U/L (ref 39–117)
CO2: 25 mEq/L (ref 19–32)
Sodium: 134 mEq/L — ABNORMAL LOW (ref 135–145)
Total Bilirubin: 0.4 mg/dL (ref 0.3–1.2)
Total Protein: 6.8 g/dL (ref 6.0–8.3)

## 2011-12-15 LAB — PROTEIN ELECTROPHORESIS, SERUM
Albumin ELP: 61.2 % (ref 55.8–66.1)
Alpha-1-Globulin: 3.5 % (ref 2.9–4.9)
Alpha-2-Globulin: 13.1 % — ABNORMAL HIGH (ref 7.1–11.8)
Beta 2: 4.6 % (ref 3.2–6.5)
Beta Globulin: 7 % (ref 4.7–7.2)

## 2011-12-17 NOTE — Patient Instructions (Addendum)
1. Diagnosis:  MGUS (monoclonal gammopathy of undetermined significance). There is no evidence of progression to active myeloma.  Your M-spike is still stable at around 0.2 gm/dL (same as last time).  There is no anemia, kidney failure, or hypercalcemia to suggest progression to active myeloma. 2. Recommendation:  Continue observation with rechecking your lab every 6 months.  In the future, if there is a significant increase in your M-spike (to more than 1 gm/dL) or development of anemia, renal problem, or hypercalcemia, we may need to repeat bone marrow biopsy at that time to rule out progression to active myeloma.

## 2011-12-18 ENCOUNTER — Telehealth: Payer: Self-pay | Admitting: Oncology

## 2011-12-18 ENCOUNTER — Ambulatory Visit (HOSPITAL_BASED_OUTPATIENT_CLINIC_OR_DEPARTMENT_OTHER): Payer: PRIVATE HEALTH INSURANCE | Admitting: Oncology

## 2011-12-18 VITALS — BP 153/89 | HR 69 | Temp 97.3°F | Ht 63.5 in | Wt 174.3 lb

## 2011-12-18 DIAGNOSIS — D472 Monoclonal gammopathy: Secondary | ICD-10-CM

## 2011-12-18 DIAGNOSIS — M1990 Unspecified osteoarthritis, unspecified site: Secondary | ICD-10-CM

## 2011-12-18 NOTE — Telephone Encounter (Signed)
Gave pt appt for Dercember , lab before  ML visit

## 2011-12-18 NOTE — Progress Notes (Signed)
[facility]  Telephone:(336) [phone] Fax:(336) [phone]   OFFICE PROGRESS NOTE   Cc:  Gwynneth Aliment, MD  DIAGNOSIS:  Monoclonal gammopathy of undetermined significance (MGUS).  CURRENT THERAPY:  Watchful observation  INTERVAL HISTORY: [name] 56 y.o. female returns for regular follow up with her partner. She reports she still has persistent diffuse bone pain in her shoulders, low back, bilateral hips, bilateral knees. She recently has TENS unit implantation however has not had much improvement of her diffuse bone pain.  She has mild fatigue due to chronic bone pain. However she is independent of slight activities of daily living and light chores on the house. She denies fever, anorexia, weight loss, recurrent infection.  Patient denies headache, visual changes, confusion, drenching night sweats, palpable lymph node swelling, mucositis, odynophagia, dysphagia, nausea vomiting, jaundice, chest pain, palpitation, shortness of breath, dyspnea on exertion, productive cough, gum bleeding, epistaxis, hematemesis, hemoptysis, abdominal pain, abdominal swelling, early satiety, melena, hematochezia, hematuria, skin rash, spontaneous bleeding, joint swelling,heat or cold intolerance, bowel bladder incontinence,  paresthesia, depression, suicidal or homocidal ideation, feeling hopelessness.   Past Medical History  Diagnosis Date  . Hyperlipidemia   . HTN (hypertension)   . Mitral valve prolapse   . GERD (gastroesophageal reflux disease)   . Depression   . Osteoarthritis   . Monoclonal (M) protein disease, multiple 'M' protein   . Cervical disc disease   . Migraine headache     Past Surgical History  Procedure Date  . Knee arthroscopy     right knee x 2  . Meniscus repair     right knee x 1  . Cervical disc surgery   . Tonsilectomy, adenoidectomy, bilateral myringotomy and tubes   . Breast biopsy     negative x 2   . Appendectomy   . Tubal ligation   .  Partial hysterectomy   . Bunionectomy     Current Outpatient Prescriptions  Medication Sig Dispense Refill  . acetaminophen (TYLENOL) 500 MG tablet Take 500 mg by mouth every 6 (six) hours as needed. Take 1000 mg daily      . ALPRAZolam (XANAX) 0.25 MG tablet Take 0.25 mg by mouth at bedtime as needed.      Marland Kitchen estradiol (VIVELLE-DOT) 0.0375 MG/24HR Place 1 patch onto the skin. Every 8 days      . magnesium gluconate (MAGONATE) 30 MG tablet Take 30 mg by mouth. 3 tablets daily      . meloxicam (MOBIC) 15 MG tablet Take 15 mg by mouth. Take 1 tablet 2 times per day X 2 weeks; then take 1 tablet daily      . metoprolol (LOPRESSOR) 50 MG tablet Take 50 mg by mouth daily.      Marland Kitchen OVER THE COUNTER MEDICATION daily. Simplex-F  Take 1 tablet daily      . pantoprazole (PROTONIX) 40 MG tablet Take 40 mg by mouth daily.      . rosuvastatin (CRESTOR) 10 MG tablet Take 10 mg by mouth daily.      . Vilazodone HCl (VIIBRYD) 40 MG TABS Take 40 mg by mouth daily.      . Vitamin D, Ergocalciferol, (DRISDOL) 50000 UNITS CAPS Take 5,000 Units by mouth daily.        ALLERGIES:   has no known allergies.  REVIEW OF SYSTEMS:  The rest of the 14-point review of system was negative.   Filed Vitals:   12/18/11 1041  BP: 153/89  Pulse: 69  Temp:  97.3 F (36.3 C)   Wt Readings from Last 3 Encounters:  12/18/11 174 lb 4.8 oz (79.062 kg)  08/27/11 177 lb 8 oz (80.513 kg)   ECOG Performance status: 1  PHYSICAL EXAMINATION:   General:  well-nourished woman, in no acute distress.  Eyes:  no scleral icterus.  ENT:  There were no oropharyngeal lesions.  Neck was without thyromegaly.  Lymphatics:  Negative cervical, supraclavicular or axillary adenopathy.  Respiratory: lungs were clear bilaterally without wheezing or crackles.  Cardiovascular:  Regular rate and rhythm, S1/S2, without murmur, rub or gallop.  There was no pedal edema.  GI:  abdomen was soft, flat, nontender, nondistended, without organomegaly.   Muscoloskeletal:  no spinal tenderness of palpation of vertebral spine.  Skin exam was without echymosis, petichae.  Neuro exam was nonfocal.  Patient was able to get on and off exam table without assistance.  Gait was normal.  Patient was alerted and oriented.  Attention was good.   Language was appropriate.  Mood was normal without depression.  Speech was not pressured.  Thought content was not tangential.     LABORATORY/RADIOLOGY DATA:  Lab Results  Component Value Date   WBC 12.2* 12/11/2011   HGB 13.2 12/11/2011   HCT 40.1 12/11/2011   PLT 413* 12/11/2011   GLUCOSE 88 12/11/2011   ALKPHOS 70 12/11/2011   ALT 9 12/11/2011   AST 11 12/11/2011   NA 134* 12/11/2011   K 4.5 12/11/2011   CL 99 12/11/2011   CREATININE 0.84 12/11/2011   BUN 14 12/11/2011   CO2 25 12/11/2011    ASSESSMENT AND PLAN:   1. MGUS (monoclonal gammopathy of undetermined significance). There is no evidence of progression to active myeloma.  Her M-spike is still stable at around 0.2 gm/dL (same as last time).  There is no anemia, kidney failure, or hypercalcemia to suggest progression to active myeloma.  Her bone pain was not due to myeloma; rather OA.  I recommended to continue observation with rechecking lab here at the [facility] every 6 months.  In the future, if there is a significant increase in  M-spike (to more than 1 gm/dL) or development of anemia, renal problem, or hypercalcemia, we may need to repeat bone marrow biopsy at that time to rule out progression to active myeloma.  Patient and her partner expressed informed understanding and wished to proceed with my recommendation.  2. osteoarthritis: On Ibuprofen and Tylenol as needed.  She has TENS unit.  She is planning to have acupuncture.   3. hypertension: She is on Metoprolol per PCP.  4. hyperlipidemia: She is on Crestor per PCP.  5. Follow up in 6months.     The length of time of the face-to-face encounter was 15 minutes. More than 50% of time was spent counseling and  coordination of care.

## 2012-04-13 ENCOUNTER — Other Ambulatory Visit: Payer: Self-pay | Admitting: Internal Medicine

## 2012-04-13 DIAGNOSIS — M545 Low back pain: Secondary | ICD-10-CM

## 2012-04-17 ENCOUNTER — Ambulatory Visit
Admission: RE | Admit: 2012-04-17 | Discharge: 2012-04-17 | Disposition: A | Discharge status: Home / Self Care | Payer: PRIVATE HEALTH INSURANCE | Source: Ambulatory Visit | Attending: Internal Medicine | Admitting: Internal Medicine

## 2012-04-17 DIAGNOSIS — M545 Low back pain: Secondary | ICD-10-CM

## 2012-06-11 ENCOUNTER — Other Ambulatory Visit (HOSPITAL_BASED_OUTPATIENT_CLINIC_OR_DEPARTMENT_OTHER): Payer: PRIVATE HEALTH INSURANCE | Admitting: Lab

## 2012-06-11 DIAGNOSIS — D472 Monoclonal gammopathy: Secondary | ICD-10-CM

## 2012-06-11 LAB — CBC WITH DIFFERENTIAL/PLATELET
Basophils Absolute: 0.1 10*3/uL (ref 0.0–0.1)
EOS%: 2.6 % (ref 0.0–7.0)
Eosinophils Absolute: 0.2 10*3/uL (ref 0.0–0.5)
HCT: 38.9 % (ref 34.8–46.6)
HGB: 13 g/dL (ref 11.6–15.9)
MCH: 30.6 pg (ref 25.1–34.0)
MCV: 91.6 fL (ref 79.5–101.0)
MONO%: 6.4 % (ref 0.0–14.0)
NEUT%: 48.9 % (ref 38.4–76.8)

## 2012-06-11 LAB — COMPREHENSIVE METABOLIC PANEL (CC13)
AST: 16 U/L (ref 5–34)
Alkaline Phosphatase: 69 U/L (ref 40–150)
BUN: 13 mg/dL (ref 7.0–26.0)
Calcium: 9.9 mg/dL (ref 8.4–10.4)
Chloride: 103 mEq/L (ref 98–107)
Creatinine: 0.9 mg/dL (ref 0.6–1.1)
Glucose: 123 mg/dl — ABNORMAL HIGH (ref 70–99)

## 2012-06-15 LAB — PROTEIN ELECTROPHORESIS, SERUM
Albumin ELP: 64.9 % (ref 55.8–66.1)
Alpha-1-Globulin: 3.4 % (ref 2.9–4.9)
Alpha-2-Globulin: 11.1 % (ref 7.1–11.8)
Gamma Globulin: 10.4 % — ABNORMAL LOW (ref 11.1–18.8)

## 2012-06-15 LAB — KAPPA/LAMBDA LIGHT CHAINS
Kappa free light chain: 1.15 mg/dL (ref 0.33–1.94)
Kappa:Lambda Ratio: 0.91 (ref 0.26–1.65)

## 2012-06-18 ENCOUNTER — Telehealth: Payer: Self-pay | Admitting: Oncology

## 2012-06-18 ENCOUNTER — Ambulatory Visit (HOSPITAL_BASED_OUTPATIENT_CLINIC_OR_DEPARTMENT_OTHER): Payer: PRIVATE HEALTH INSURANCE | Admitting: Oncology

## 2012-06-18 ENCOUNTER — Encounter: Payer: Self-pay | Admitting: Oncology

## 2012-06-18 VITALS — BP 140/87 | HR 78 | Temp 97.0°F | Resp 20 | Ht 63.5 in | Wt 175.3 lb

## 2012-06-18 DIAGNOSIS — M1990 Unspecified osteoarthritis, unspecified site: Secondary | ICD-10-CM

## 2012-06-18 DIAGNOSIS — D472 Monoclonal gammopathy: Secondary | ICD-10-CM

## 2012-06-18 DIAGNOSIS — R5383 Other fatigue: Secondary | ICD-10-CM

## 2012-06-18 DIAGNOSIS — M899 Disorder of bone, unspecified: Secondary | ICD-10-CM

## 2012-06-18 NOTE — Telephone Encounter (Signed)
appts made and printed for pt  °

## 2012-06-18 NOTE — Progress Notes (Signed)
[facility]  Telephone:(336) [phone] Fax:(336) [phone]   OFFICE PROGRESS NOTE   Cc:  No primary provider on file.  DIAGNOSIS:  Monoclonal gammopathy of undetermined significance (MGUS).  CURRENT THERAPY:  Watchful observation  INTERVAL HISTORY: [name] 56 y.o. female returns for regular follow up by herself. She reports she still has persistent diffuse bone pain in her shoulders, low back, bilateral hips, bilateral knees. She recently has TENS unit implantation however has not had much improvement of her diffuse bone pain.  She has mild fatigue due to chronic bone pain. However she is independent of slight activities of daily living and light chores on the house. She denies fever, anorexia, weight loss, recurrent infection.  Patient denies headache, visual changes, confusion, drenching night sweats, palpable lymph node swelling, mucositis, odynophagia, dysphagia, nausea vomiting, jaundice, chest pain, palpitation, shortness of breath, dyspnea on exertion, productive cough, gum bleeding, epistaxis, hematemesis, hemoptysis, abdominal pain, abdominal swelling, early satiety, melena, hematochezia, hematuria, skin rash, spontaneous bleeding, joint swelling,heat or cold intolerance, bowel bladder incontinence,  paresthesia, depression, suicidal or homocidal ideation, feeling hopelessness.   Past Medical History  Diagnosis Date  . Hyperlipidemia   . HTN (hypertension)   . Mitral valve prolapse   . GERD (gastroesophageal reflux disease)   . Depression   . Osteoarthritis   . Monoclonal (M) protein disease, multiple 'M' protein   . Cervical disc disease   . Migraine headache     Past Surgical History  Procedure Date  . Knee arthroscopy     right knee x 2  . Meniscus repair     right knee x 1  . Cervical disc surgery   . Tonsilectomy, adenoidectomy, bilateral myringotomy and tubes   . Breast biopsy     negative x 2   . Appendectomy   . Tubal ligation    . Partial hysterectomy   . Bunionectomy     Current Outpatient Prescriptions  Medication Sig Dispense Refill  . acetaminophen (TYLENOL) 500 MG tablet Take 500 mg by mouth every 6 (six) hours as needed. Take 1000 mg daily      . ALPRAZolam (XANAX) 0.25 MG tablet Take 0.25 mg by mouth at bedtime as needed.      Marland Kitchen estradiol (VIVELLE-DOT) 0.0375 MG/24HR Place 1 patch onto the skin. Every 8 days      . fenofibrate 160 MG tablet Take 160 mg by mouth Daily.      . magnesium gluconate (MAGONATE) 30 MG tablet Take 30 mg by mouth. 3 tablets daily      . meloxicam (MOBIC) 15 MG tablet Take 15 mg by mouth. Take 1 tablet 2 times per day X 2 weeks; then take 1 tablet daily      . methocarbamol (ROBAXIN) 500 MG tablet       . metoprolol (LOPRESSOR) 50 MG tablet Take 50 mg by mouth daily.      Marland Kitchen OVER THE COUNTER MEDICATION daily. Simplex-F  Take 1 tablet daily      . pantoprazole (PROTONIX) 40 MG tablet Take 40 mg by mouth daily.      . Vilazodone HCl (VIIBRYD) 40 MG TABS Take 40 mg by mouth daily.        ALLERGIES:   has no known allergies.  REVIEW OF SYSTEMS:  The rest of the 14-point review of system was negative.   Filed Vitals:   06/18/12 1154  BP: 140/87  Pulse: 78  Temp: 97 F (36.1 C)  Resp: 20  Wt Readings from Last 3 Encounters:  06/18/12 175 lb 4.8 oz (79.516 kg)  12/18/11 174 lb 4.8 oz (79.062 kg)  08/27/11 177 lb 8 oz (80.513 kg)   ECOG Performance status: 1  PHYSICAL EXAMINATION:   General:  well-nourished woman, in no acute distress.  Eyes:  no scleral icterus.  ENT:  There were no oropharyngeal lesions.  Neck was without thyromegaly.  Lymphatics:  Negative cervical, supraclavicular or axillary adenopathy.  Respiratory: lungs were clear bilaterally without wheezing or crackles.  Cardiovascular:  Regular rate and rhythm, S1/S2, without murmur, rub or gallop.  There was no pedal edema.  GI:  abdomen was soft, flat, nontender, nondistended, without organomegaly.   Muscoloskeletal:  no spinal tenderness of palpation of vertebral spine.  Skin exam was without echymosis, petichae.  Neuro exam was nonfocal.  Patient was able to get on and off exam table without assistance.  Gait was normal.  Patient was alerted and oriented.  Attention was good.   Language was appropriate.  Mood was normal without depression.  Speech was not pressured.  Thought content was not tangential.     LABORATORY/RADIOLOGY DATA:  Lab Results  Component Value Date   WBC 7.7 06/11/2012   HGB 13.0 06/11/2012   HCT 38.9 06/11/2012   PLT 362 06/11/2012   GLUCOSE 123* 06/11/2012   ALKPHOS 69 06/11/2012   ALT 20 06/11/2012   AST 16 06/11/2012   NA 139 06/11/2012   K 4.4 06/11/2012   CL 103 06/11/2012   CREATININE 0.9 06/11/2012   BUN 13.0 06/11/2012   CO2 24 06/11/2012    ASSESSMENT AND PLAN:   1. MGUS (monoclonal gammopathy of undetermined significance). There is no evidence of progression to active myeloma.  Her M-spike is still stable at around 0.2 gm/dL (same as last time).  There is no anemia, kidney failure, or hypercalcemia to suggest progression to active myeloma.  Her bone pain was not due to myeloma; rather OA.  I recommended to continue observation with rechecking lab here at the [facility] every 6 months.  In the future, if there is a significant increase in  M-spike (to more than 1 gm/dL) or development of anemia, renal problem, or hypercalcemia, we may need to repeat bone marrow biopsy at that time to rule out progression to active myeloma.  Patient expressed informed understanding and wished to proceed with my recommendation.  2. osteoarthritis: On Ibuprofen and Tylenol as needed.  She has TENS unit.  She is planning to have acupuncture.   3. hypertension: She is on Metoprolol per PCP.  4. hyperlipidemia: She is on Fenofibrate per PCP.  5. Follow up in 6months.     The length of time of the face-to-face encounter was 15 minutes. More than 50% of time was spent counseling and  coordination of care.

## 2012-09-01 ENCOUNTER — Other Ambulatory Visit: Payer: Self-pay | Admitting: Orthopedic Surgery

## 2012-09-13 ENCOUNTER — Ambulatory Visit
Admission: RE | Admit: 2012-09-13 | Discharge: 2012-09-13 | Disposition: A | Discharge status: Home / Self Care | Payer: PRIVATE HEALTH INSURANCE | Source: Ambulatory Visit | Attending: Orthopedic Surgery | Admitting: Orthopedic Surgery

## 2012-09-13 DIAGNOSIS — M25512 Pain in left shoulder: Secondary | ICD-10-CM

## 2012-09-13 MED ORDER — IOHEXOL 180 MG/ML  SOLN
12.0000 mL | Freq: Once | INTRAMUSCULAR | Status: AC | PRN
  Administered 2012-09-13: 12 mL via INTRA_ARTICULAR

## 2012-12-17 ENCOUNTER — Ambulatory Visit: Payer: PRIVATE HEALTH INSURANCE | Admitting: Oncology

## 2012-12-17 ENCOUNTER — Encounter: Payer: Self-pay | Admitting: Oncology

## 2012-12-17 ENCOUNTER — Other Ambulatory Visit: Payer: PRIVATE HEALTH INSURANCE | Admitting: Lab

## 2012-12-17 NOTE — Progress Notes (Signed)
Talked to pt with financial concerns.  She has cancelled appointments because she no longer has insurance and she can't afford the visits.  She does not qualify for Medicaid so I explained the financial assistance program to her and I will mail her an application today.

## 2012-12-22 ENCOUNTER — Telehealth: Payer: Self-pay | Admitting: Oncology

## 2012-12-22 NOTE — Telephone Encounter (Signed)
pt wanted to cancel appt and will call back to r/s

## 2012-12-24 ENCOUNTER — Ambulatory Visit: Payer: PRIVATE HEALTH INSURANCE | Admitting: Oncology

## 2013-08-19 ENCOUNTER — Telehealth: Payer: Self-pay | Admitting: Nurse Practitioner

## 2013-08-19 NOTE — Telephone Encounter (Signed)
Spoke with patient. She states that around 4-5 months ago, she developed dry/scaly/itching skin on her L breast. It is now appearing on R breast. Has been using some of her partners rx cream Clobetasol with no relief and also otc hydrocortisone. Advised patient it is very important for her to be seen by a provider to assess as it has been ongoing. Patient states she does not have health insurance x one year, no income and cannot come in for office visit. She does have the numbers for the [facility] clinics and will call to schedule an appointment for evaluation.   Routing to [employer], FNP and cc Dr. [name].

## 2013-08-19 NOTE — Telephone Encounter (Signed)
I recommend evaluation. Thank you for giving her the number for the [facility] clinic. I will close the encounter.

## 2013-08-19 NOTE — Telephone Encounter (Signed)
Patient does not have insurance at this time but is having dry skin on her breasts. She thinks this "may be related to breast cancer." Please advise? Patient is aware of our self-pay policy and requested information to the [facility] due to financial restrictions, phone number given.

## 2013-08-22 ENCOUNTER — Telehealth: Payer: Self-pay | Admitting: Obstetrics and Gynecology

## 2013-08-22 NOTE — Telephone Encounter (Signed)
Patient is calling states [facility] to [facility] and [facility] but she needs a referral for an appt.

## 2013-08-22 NOTE — Telephone Encounter (Signed)
Referral faxed to [facility] with patient information and last visit faxed to [phone].  Notified patient, I would call her when I received a faxed appointment response. Patient agreeable to plan.

## 2013-08-30 NOTE — Telephone Encounter (Signed)
[facility] for [facility]. They state they have referral and are working on scheduling. They will contact patient to schedule.

## 2013-09-06 ENCOUNTER — Telehealth: Payer: Self-pay | Admitting: *Deleted

## 2013-09-06 NOTE — Telephone Encounter (Signed)
Called pt and left message for pt to call clinic.

## 2013-09-14 ENCOUNTER — Telehealth: Payer: Self-pay | Admitting: *Deleted

## 2013-09-14 NOTE — Telephone Encounter (Signed)
Called patient and left message for her to call the clinic.  She needs to come sign [employer]. She needs to provide signature/income.  This is a referral.

## 2013-09-15 NOTE — Telephone Encounter (Signed)
Called pt. And left message stating we are calling in regards to a referral for mammogram-- there is paper work here in the clinic that needs to be signed. Please call clinic for more information.

## 2013-09-19 NOTE — Telephone Encounter (Signed)
Per Cathlean Marseilles a note has been sent to referring provider to set patient up for free mammogram. No further action needed.

## 2013-09-26 ENCOUNTER — Telehealth: Payer: Self-pay | Admitting: *Deleted

## 2013-09-26 NOTE — Telephone Encounter (Signed)
Follow up call to patient.  Dr Sabra Heck reviewed message from [facility] (see scanned note) and we would like to offer to see patient here in our office and facilitate getting her to correct providers.  LMTCB.

## 2013-10-13 NOTE — Telephone Encounter (Signed)
[employer] scholarship form for [facility] mailed along with an advertisement from [employer] regarding self pay discounts.

## 2013-10-13 NOTE — Telephone Encounter (Signed)
Follow-up call to patient.  She states her breast problem has resolved. Has been gone for 3-4 weeks now.  Advised patient that we still feel like breast changes warrant evaluation so would recommend she still have her screening MMG and that there are scholarships and self pay rates available for this.  We will mail her info on this program.  Last MMG per patient Jan 2014.  To Dr Sabra Heck for review.

## 2013-10-14 NOTE — Telephone Encounter (Signed)
Anything else needed or ok to close encounter?

## 2013-10-17 NOTE — Telephone Encounter (Signed)
Did [name] decline a visit?  If so, please document and then can close encounter.

## 2013-10-20 NOTE — Telephone Encounter (Signed)
I advised patient I was calling to offer her an appointment here and she reported problem had resolved.  Do you still want to see her?  I mailed MMG scholarship info.

## 2013-10-28 NOTE — Telephone Encounter (Signed)
Just checking to see if I can close encounter?

## 2013-11-01 NOTE — Telephone Encounter (Signed)
As was a skin issue and is resolved, unlikely this is a significant "breast" problem.  Encounter closed.

## 2013-11-30 ENCOUNTER — Other Ambulatory Visit: Payer: Self-pay | Admitting: Nurse Practitioner

## 2013-11-30 DIAGNOSIS — Z1231 Encounter for screening mammogram for malignant neoplasm of breast: Secondary | ICD-10-CM

## 2013-12-06 ENCOUNTER — Ambulatory Visit (HOSPITAL_COMMUNITY)
Admission: RE | Admit: 2013-12-06 | Discharge: 2013-12-06 | Disposition: A | Discharge status: Home / Self Care | Payer: Self-pay | Source: Ambulatory Visit | Attending: Nurse Practitioner | Admitting: Nurse Practitioner

## 2013-12-06 DIAGNOSIS — Z1231 Encounter for screening mammogram for malignant neoplasm of breast: Secondary | ICD-10-CM

## 2014-03-01 ENCOUNTER — Other Ambulatory Visit: Payer: Self-pay | Admitting: Hematology and Oncology

## 2014-03-01 DIAGNOSIS — D890 Polyclonal hypergammaglobulinemia: Principal | ICD-10-CM

## 2014-03-01 DIAGNOSIS — D472 Monoclonal gammopathy: Secondary | ICD-10-CM

## 2014-03-02 ENCOUNTER — Telehealth: Payer: Self-pay | Admitting: Hematology and Oncology

## 2014-03-02 NOTE — Telephone Encounter (Signed)
, °

## 2014-03-07 ENCOUNTER — Other Ambulatory Visit (HOSPITAL_BASED_OUTPATIENT_CLINIC_OR_DEPARTMENT_OTHER): Payer: Medicare Other

## 2014-03-07 DIAGNOSIS — D890 Polyclonal hypergammaglobulinemia: Principal | ICD-10-CM

## 2014-03-07 DIAGNOSIS — D472 Monoclonal gammopathy: Secondary | ICD-10-CM | POA: Diagnosis not present

## 2014-03-07 LAB — CBC WITH DIFFERENTIAL/PLATELET
BASO%: 0.4 % (ref 0.0–2.0)
BASOS ABS: 0 10*3/uL (ref 0.0–0.1)
EOS%: 2.1 % (ref 0.0–7.0)
Eosinophils Absolute: 0.2 10*3/uL (ref 0.0–0.5)
HEMATOCRIT: 39.9 % (ref 34.8–46.6)
HEMOGLOBIN: 13.2 g/dL (ref 11.6–15.9)
LYMPH%: 47.1 % (ref 14.0–49.7)
MCH: 29.7 pg (ref 25.1–34.0)
MCHC: 33.1 g/dL (ref 31.5–36.0)
MCV: 89.8 fL (ref 79.5–101.0)
MONO#: 0.6 10*3/uL (ref 0.1–0.9)
MONO%: 6.3 % (ref 0.0–14.0)
NEUT#: 4.2 10*3/uL (ref 1.5–6.5)
NEUT%: 44.1 % (ref 38.4–76.8)
Platelets: 326 10*3/uL (ref 145–400)
RBC: 4.44 10*6/uL (ref 3.70–5.45)
RDW: 12.7 % (ref 11.2–14.5)
WBC: 9.6 10*3/uL (ref 3.9–10.3)
lymph#: 4.5 10*3/uL — ABNORMAL HIGH (ref 0.9–3.3)

## 2014-03-07 LAB — COMPREHENSIVE METABOLIC PANEL (CC13)
ALK PHOS: 90 U/L (ref 40–150)
ALT: 17 U/L (ref 0–55)
AST: 17 U/L (ref 5–34)
Albumin: 4 g/dL (ref 3.5–5.0)
Anion Gap: 9 mEq/L (ref 3–11)
BILIRUBIN TOTAL: 0.22 mg/dL (ref 0.20–1.20)
BUN: 12.3 mg/dL (ref 7.0–26.0)
CALCIUM: 9.6 mg/dL (ref 8.4–10.4)
CO2: 27 mEq/L (ref 22–29)
CREATININE: 0.8 mg/dL (ref 0.6–1.1)
Chloride: 103 mEq/L (ref 98–109)
Glucose: 87 mg/dl (ref 70–140)
Potassium: 4.3 mEq/L (ref 3.5–5.1)
Sodium: 139 mEq/L (ref 136–145)
Total Protein: 7.2 g/dL (ref 6.4–8.3)

## 2014-03-08 DIAGNOSIS — F329 Major depressive disorder, single episode, unspecified: Secondary | ICD-10-CM | POA: Diagnosis not present

## 2014-03-08 DIAGNOSIS — I10 Essential (primary) hypertension: Secondary | ICD-10-CM | POA: Diagnosis not present

## 2014-03-08 DIAGNOSIS — E785 Hyperlipidemia, unspecified: Secondary | ICD-10-CM | POA: Diagnosis not present

## 2014-03-08 DIAGNOSIS — M5090 Cervical disc disorder, unspecified, unspecified cervical region: Secondary | ICD-10-CM | POA: Diagnosis not present

## 2014-03-09 LAB — KAPPA/LAMBDA LIGHT CHAINS
KAPPA LAMBDA RATIO: 1.53 (ref 0.26–1.65)
Kappa free light chain: 1.73 mg/dL (ref 0.33–1.94)
Lambda Free Lght Chn: 1.13 mg/dL (ref 0.57–2.63)

## 2014-03-09 LAB — SPEP & IFE WITH QIG
ALBUMIN ELP: 63.4 % (ref 55.8–66.1)
Alpha-1-Globulin: 5.8 % — ABNORMAL HIGH (ref 2.9–4.9)
Alpha-2-Globulin: 11 % (ref 7.1–11.8)
BETA 2: 2.9 % — AB (ref 3.2–6.5)
Beta Globulin: 6.7 % (ref 4.7–7.2)
Gamma Globulin: 10.2 % — ABNORMAL LOW (ref 11.1–18.8)
IGA: 135 mg/dL (ref 69–380)
IGM, SERUM: 141 mg/dL (ref 52–322)
IgG (Immunoglobin G), Serum: 703 mg/dL (ref 690–1700)
M-Spike, %: 0.21 g/dL
TOTAL PROTEIN, SERUM ELECTROPHOR: 6.9 g/dL (ref 6.0–8.3)

## 2014-03-09 LAB — BETA 2 MICROGLOBULIN, SERUM: Beta-2 Microglobulin: 1.71 mg/L (ref <=2.51)

## 2014-03-10 DIAGNOSIS — M7750 Other enthesopathy of unspecified foot: Secondary | ICD-10-CM | POA: Diagnosis not present

## 2014-03-14 ENCOUNTER — Ambulatory Visit (HOSPITAL_BASED_OUTPATIENT_CLINIC_OR_DEPARTMENT_OTHER): Payer: Medicare Other | Admitting: Hematology and Oncology

## 2014-03-14 ENCOUNTER — Encounter: Payer: Self-pay | Admitting: Hematology and Oncology

## 2014-03-14 ENCOUNTER — Telehealth: Payer: Self-pay | Admitting: Hematology and Oncology

## 2014-03-14 VITALS — BP 143/88 | HR 68 | Temp 98.3°F | Resp 18 | Ht 63.0 in | Wt 171.6 lb

## 2014-03-14 DIAGNOSIS — M150 Primary generalized (osteo)arthritis: Secondary | ICD-10-CM

## 2014-03-14 DIAGNOSIS — F17209 Nicotine dependence, unspecified, with unspecified nicotine-induced disorders: Secondary | ICD-10-CM | POA: Insufficient documentation

## 2014-03-14 DIAGNOSIS — M159 Polyosteoarthritis, unspecified: Secondary | ICD-10-CM

## 2014-03-14 DIAGNOSIS — D472 Monoclonal gammopathy: Secondary | ICD-10-CM | POA: Diagnosis not present

## 2014-03-14 DIAGNOSIS — Z720 Tobacco use: Secondary | ICD-10-CM | POA: Insufficient documentation

## 2014-03-14 DIAGNOSIS — M1990 Unspecified osteoarthritis, unspecified site: Secondary | ICD-10-CM | POA: Diagnosis not present

## 2014-03-14 DIAGNOSIS — F17200 Nicotine dependence, unspecified, uncomplicated: Secondary | ICD-10-CM

## 2014-03-14 DIAGNOSIS — D890 Polyclonal hypergammaglobulinemia: Secondary | ICD-10-CM

## 2014-03-14 NOTE — Telephone Encounter (Signed)
gv adn pritned appt sched and avs for pt for Sept 2016

## 2014-03-14 NOTE — Assessment & Plan Note (Signed)
I recommend a trial of high-dose vitamin D supplements.

## 2014-03-14 NOTE — Assessment & Plan Note (Signed)
I spent some time counseling the patient the importance of tobacco cessation. she is currently attempting to quit on her own  I gave her patient education handout and encouraged her to sign up for smoking cessation class.  

## 2014-03-14 NOTE — Progress Notes (Signed)
[facility] progress notes  Patient Care Team: Bo Merino, MD as Consulting Physician (Rheumatology) Vidal Schwalbe, MD as Attending Physician (Family Medicine)  CHIEF COMPLAINTS/PURPOSE OF VISIT:  Abnormal M spike  HISTORY OF PRESENTING ILLNESS:  [name] 58 y.o. female was transferred to my care after her prior physician has left.  I reviewed the patient's records extensive and collaborated the history with the patient. Summary of her history is as follows: She reports she still has persistent diffuse bone pain in her shoulders, low back, bilateral hips, bilateral knees. She recently has TENS unit implantation however has not had much improvement of her diffuse bone pain.  She has mild fatigue due to chronic bone pain. She had abnormal M spike detected with no evidence of lytic lesion. She is being observed. She denies recent infection. She continues to have chronic back pain and neck pain.  MEDICAL HISTORY:  Past Medical History  Diagnosis Date  . Hyperlipidemia   . HTN (hypertension)   . Mitral valve prolapse   . GERD (gastroesophageal reflux disease)   . Depression   . Osteoarthritis   . Monoclonal (M) protein disease, multiple 'M' protein   . Cervical disc disease   . Migraine headache     SURGICAL HISTORY: Past Surgical History  Procedure Laterality Date  . Knee arthroscopy      right knee x 2  . Meniscus repair      right knee x 1  . Cervical disc surgery    . Tonsilectomy, adenoidectomy, bilateral myringotomy and tubes    . Breast biopsy      negative x 2   . Appendectomy    . Tubal ligation    . Partial hysterectomy    . Bunionectomy      SOCIAL HISTORY: History   Social History  . Marital Status: Single    Spouse Name: N/A    Number of Children: 2  . Years of Education: N/A   Occupational History  .      Intake coordinator   Social History Main Topics  . Smoking status: Current Every Day Smoker -- 1.00  packs/day for 35 years  . Smokeless tobacco: Never Used  . Alcohol Use: No  . Drug Use: No  . Sexual Activity: No   Other Topics Concern  . Not on file   Social History Narrative  . No narrative on file    FAMILY HISTORY: Family History  Problem Relation Age of Onset  . Heart disease Mother   . Heart disease Father   . Cancer Brother     lung cancer  . Cancer Brother     lung cancer    ALLERGIES:  has No Known Allergies.  MEDICATIONS:  Current Outpatient Prescriptions  Medication Sig Dispense Refill  . acetaminophen (TYLENOL) 500 MG tablet Take 500 mg by mouth every 6 (six) hours as needed. Take 1000 mg daily      . ALPRAZolam (XANAX) 0.5 MG tablet Take 0.5 mg by mouth at bedtime as needed for anxiety.      Marland Kitchen desvenlafaxine (PRISTIQ) 100 MG 24 hr tablet Take 100 mg by mouth daily.      Marland Kitchen ibuprofen (ADVIL,MOTRIN) 200 MG tablet Take 400 mg by mouth every 8 (eight) hours as needed (400 mg to 800 mg 2 to 3 times daily).      . magnesium gluconate (MAGONATE) 30 MG tablet Take 30 mg by mouth. 3 tablets daily      .  methocarbamol (ROBAXIN) 500 MG tablet as needed.       . metoprolol succinate (TOPROL-XL) 100 MG 24 hr tablet Take 100 mg by mouth daily. Take with or immediately following a meal.      . omeprazole (PRILOSEC) 20 MG capsule Take 20 mg by mouth daily.      Marland Kitchen OVER THE COUNTER MEDICATION daily. Simplex-F  Take 1 tablet daily      . rosuvastatin (CRESTOR) 10 MG tablet Take 10 mg by mouth daily.       No current facility-administered medications for this visit.    REVIEW OF SYSTEMS:   Constitutional: Denies fevers, chills or abnormal night sweats Eyes: Denies blurriness of vision, double vision or watery eyes Ears, nose, mouth, throat, and face: Denies mucositis or sore throat Respiratory: Denies cough, dyspnea or wheezes Cardiovascular: Denies palpitation, chest discomfort or lower extremity swelling Gastrointestinal:  Denies nausea, heartburn or change in bowel  habits Skin: Denies abnormal skin rashes Lymphatics: Denies new lymphadenopathy or easy bruising Neurological:Denies numbness, tingling or new weaknesses Behavioral/Psych: Mood is stable, no new changes  All other systems were reviewed with the patient and are negative.  PHYSICAL EXAMINATION: ECOG PERFORMANCE STATUS: 1 - Symptomatic but completely ambulatory  Filed Vitals:   03/14/14 1103  BP: 143/88  Pulse: 68  Temp: 98.3 F (36.8 C)  Resp: 18   Filed Weights   03/14/14 1103  Weight: 171 lb 9.6 oz (77.837 kg)    GENERAL:alert, no distress and comfortable SKIN: skin color, texture, turgor are normal, no rashes or significant lesions EYES: normal, conjunctiva are pink and non-injected, sclera clear OROPHARYNX:no exudate, normal lips, buccal mucosa, and tongue  NECK: supple, thyroid normal size, non-tender, without nodularity LYMPH:  no palpable lymphadenopathy in the cervical, axillary or inguinal LUNGS: clear to auscultation and percussion with normal breathing effort HEART: regular rate & rhythm and no murmurs without lower extremity edema ABDOMEN:abdomen soft, non-tender and normal bowel sounds Musculoskeletal:no cyanosis of digits and no clubbing  PSYCH: alert & oriented x 3 with fluent speech NEURO: no focal motor/sensory deficits  LABORATORY DATA:  I have reviewed the data as listed Lab Results  Component Value Date   WBC 9.6 03/07/2014   HGB 13.2 03/07/2014   HCT 39.9 03/07/2014   MCV 89.8 03/07/2014   PLT 326 03/07/2014    Recent Labs  03/07/14 1212  NA 139  K 4.3  CO2 27  GLUCOSE 87  BUN 12.3  CREATININE 0.8  CALCIUM 9.6  PROT 7.2  ALBUMIN 4.0  AST 17  ALT 17  ALKPHOS 90  BILITOT 0.22   ASSESSMENT & PLAN:  Monoclonal (M) protein disease, multiple 'M' protein Clinically, she has no evidence of disease progression or end organ damage. I recommend yearly visit with history, physical examination, blood work and Marine scientist.  Tobacco abuse I spent  some time counseling the patient the importance of tobacco cessation. she is currently attempting to quit on her own  I gave her patient education handout and encouraged her to sign up for smoking cessation class.   Osteoarthritis I recommend a trial of high-dose vitamin D supplements.    Orders Placed This Encounter  Procedures  . DG Bone Survey Met    Standing Status: Future     Number of Occurrences:      Standing Expiration Date: 05/14/2015    Order Specific Question:  Reason for Exam (SYMPTOM  OR DIAGNOSIS REQUIRED)    Answer:  staging myeloma  Order Specific Question:  Is the patient pregnant?    Answer:  No    Order Specific Question:  Preferred imaging location?    Answer:  [facility]  . Comprehensive metabolic panel    Standing Status: Future     Number of Occurrences:      Standing Expiration Date: 05/14/2015  . CBC with Differential    Standing Status: Future     Number of Occurrences:      Standing Expiration Date: 05/14/2015  . Lactate dehydrogenase    Standing Status: Future     Number of Occurrences:      Standing Expiration Date: 05/14/2015  . SPEP & IFE with QIG    Standing Status: Future     Number of Occurrences:      Standing Expiration Date: 05/14/2015  . Kappa/lambda light chains    Standing Status: Future     Number of Occurrences:      Standing Expiration Date: 05/14/2015  . Beta 2 microglobulin, serum    Standing Status: Future     Number of Occurrences:      Standing Expiration Date: 05/14/2015    All questions were answered. The patient knows to call the clinic with any problems, questions or concerns. I spent 15 minutes counseling the patient face to face. The total time spent in the appointment was 20 minutes and more than 50% was on counseling.     [facility], [place], MD 03/14/2014 12:27 PM

## 2014-03-14 NOTE — Assessment & Plan Note (Signed)
Clinically, she has no evidence of disease progression or end organ damage. I recommend yearly visit with history, physical examination, blood work and Marine scientist.

## 2014-04-18 DIAGNOSIS — M792 Neuralgia and neuritis, unspecified: Secondary | ICD-10-CM | POA: Diagnosis not present

## 2014-04-18 DIAGNOSIS — M9902 Segmental and somatic dysfunction of thoracic region: Secondary | ICD-10-CM | POA: Diagnosis not present

## 2014-04-18 DIAGNOSIS — M5481 Occipital neuralgia: Secondary | ICD-10-CM | POA: Diagnosis not present

## 2014-04-18 DIAGNOSIS — M5412 Radiculopathy, cervical region: Secondary | ICD-10-CM | POA: Diagnosis not present

## 2014-04-18 DIAGNOSIS — M729 Fibroblastic disorder, unspecified: Secondary | ICD-10-CM | POA: Diagnosis not present

## 2014-04-18 DIAGNOSIS — M9903 Segmental and somatic dysfunction of lumbar region: Secondary | ICD-10-CM | POA: Diagnosis not present

## 2014-04-18 DIAGNOSIS — M9901 Segmental and somatic dysfunction of cervical region: Secondary | ICD-10-CM | POA: Diagnosis not present

## 2014-04-24 ENCOUNTER — Telehealth: Payer: Self-pay | Admitting: Nurse Practitioner

## 2014-04-24 NOTE — Telephone Encounter (Signed)
Patient calling to speak with nurse about "flaky skin around her areolas on both breasts."

## 2014-04-24 NOTE — Telephone Encounter (Signed)
Spoke with patient. Patient states that she has been experiencing "dry, scaly skin around my areolas for one year." Patient has tried to use Vaseline and medicated cream with out any relief. Patient has had recent mammogram done and states that "everything was normal." States that sometimes the area is itchy around both areolas but not all the time. One area around right breast is red. Denies pain or discharge from area. Denies switching soaps, lotions, or laundry detergent. "I just want to come in to see Patty to make sure everything is okay." Appointment scheduled for 10/22 at 10:15am with Milford Cage, [place]. Patient is agreeable to date and time.  Routing to provider for final review. Patient agreeable to disposition. Will close encounter

## 2014-04-27 ENCOUNTER — Encounter: Payer: Self-pay | Admitting: Nurse Practitioner

## 2014-04-27 ENCOUNTER — Ambulatory Visit (INDEPENDENT_AMBULATORY_CARE_PROVIDER_SITE_OTHER): Payer: Medicare Other | Admitting: Nurse Practitioner

## 2014-04-27 VITALS — BP 130/84 | HR 68 | Ht 63.0 in | Wt 169.0 lb

## 2014-04-27 DIAGNOSIS — M5412 Radiculopathy, cervical region: Secondary | ICD-10-CM | POA: Diagnosis not present

## 2014-04-27 DIAGNOSIS — M5481 Occipital neuralgia: Secondary | ICD-10-CM | POA: Diagnosis not present

## 2014-04-27 DIAGNOSIS — M729 Fibroblastic disorder, unspecified: Secondary | ICD-10-CM | POA: Diagnosis not present

## 2014-04-27 DIAGNOSIS — L309 Dermatitis, unspecified: Secondary | ICD-10-CM

## 2014-04-27 DIAGNOSIS — M792 Neuralgia and neuritis, unspecified: Secondary | ICD-10-CM | POA: Diagnosis not present

## 2014-04-27 DIAGNOSIS — M9901 Segmental and somatic dysfunction of cervical region: Secondary | ICD-10-CM | POA: Diagnosis not present

## 2014-04-27 DIAGNOSIS — M9902 Segmental and somatic dysfunction of thoracic region: Secondary | ICD-10-CM | POA: Diagnosis not present

## 2014-04-27 DIAGNOSIS — M9903 Segmental and somatic dysfunction of lumbar region: Secondary | ICD-10-CM | POA: Diagnosis not present

## 2014-04-27 NOTE — Progress Notes (Signed)
Patient ID: [name], female   DOB: 29-Oct-1955, 58 y.o.   MRN: [id number]   Subjective:   59 y.o. Partnered Caucasian female presents for evaluation of bilateral  breast areola 'rash'.  She states the left breast started first about  a years ago.  Then about 3-4 months later the 'rash' starting on the right side.  She felt the main issue was dryness and started putting on lotion without relief.  Then used Vaseline and no help.  Most recently looked on line and was concerned about breast cancer.   During this time she had no insurance and was waiting on her disability.  That is now effective along with Medicare since July.   Patient sought evaluation because of rash and and concern about Padgets disease..  Contributing factors include family hx on mother's side cousin X 2 diagnosed on their 38's.  . Denies no nipple discharge.. Patient denies history of trauma, bites, or injuries. Last mammogram was 4 months on 12/06/13 and reported as normal..  Previous evaluation has included no workup   Review of Systems Pertinent items are noted in HPI.@SUBJECTIVE    Objective:   @General  appearance: alert, cooperative and appears stated age Head: Normocephalic, without obvious abnormality, atraumatic Neck: no adenopathy, supple, symmetrical, trachea midline and thyroid not enlarged, symmetric, no tenderness/mass/nodules Back: symmetric, no curvature. ROM normal. No CVA tenderness. few areas of seborrheic dermatitis, Lungs: clear to auscultation bilaterally Breasts: positive findings: nipple dermatitis bilaterally and multiple areas that look like seborrheic dermatitis. These areas are scaly and dry.  No color changes that would be consistent with Padgets disease.    Most areas are around the aereloa but also scattered across the breast. Heart: regular rate and rhythm Abdomen: soft non tender.   Assessment:   ASSESSMENT:  Patient is diagnosed with Dermatologic etiology ? for Breast rash   Plan:    PLAN: The patient has a documented plan to follow with further care with Dermatologist - Dr. Renaye Rakers office

## 2014-04-27 NOTE — Patient Instructions (Addendum)
we will call you with appointment date and time

## 2014-04-28 DIAGNOSIS — L309 Dermatitis, unspecified: Secondary | ICD-10-CM | POA: Diagnosis not present

## 2014-04-28 DIAGNOSIS — L700 Acne vulgaris: Secondary | ICD-10-CM | POA: Diagnosis not present

## 2014-04-30 NOTE — Progress Notes (Signed)
Encounter reviewed by Dr. Brook Silva.  

## 2014-05-02 DIAGNOSIS — E785 Hyperlipidemia, unspecified: Secondary | ICD-10-CM | POA: Diagnosis not present

## 2014-05-02 DIAGNOSIS — I10 Essential (primary) hypertension: Secondary | ICD-10-CM | POA: Diagnosis not present

## 2014-05-02 DIAGNOSIS — E782 Mixed hyperlipidemia: Secondary | ICD-10-CM | POA: Diagnosis not present

## 2014-05-08 ENCOUNTER — Encounter: Payer: Self-pay | Admitting: Nurse Practitioner

## 2014-05-11 DIAGNOSIS — M5481 Occipital neuralgia: Secondary | ICD-10-CM | POA: Diagnosis not present

## 2014-05-11 DIAGNOSIS — M729 Fibroblastic disorder, unspecified: Secondary | ICD-10-CM | POA: Diagnosis not present

## 2014-05-11 DIAGNOSIS — M9902 Segmental and somatic dysfunction of thoracic region: Secondary | ICD-10-CM | POA: Diagnosis not present

## 2014-05-11 DIAGNOSIS — M5412 Radiculopathy, cervical region: Secondary | ICD-10-CM | POA: Diagnosis not present

## 2014-05-11 DIAGNOSIS — M792 Neuralgia and neuritis, unspecified: Secondary | ICD-10-CM | POA: Diagnosis not present

## 2014-05-11 DIAGNOSIS — M9901 Segmental and somatic dysfunction of cervical region: Secondary | ICD-10-CM | POA: Diagnosis not present

## 2014-05-11 DIAGNOSIS — M9903 Segmental and somatic dysfunction of lumbar region: Secondary | ICD-10-CM | POA: Diagnosis not present

## 2014-05-31 DIAGNOSIS — M722 Plantar fascial fibromatosis: Secondary | ICD-10-CM | POA: Diagnosis not present

## 2014-05-31 DIAGNOSIS — M7750 Other enthesopathy of unspecified foot: Secondary | ICD-10-CM | POA: Diagnosis not present

## 2014-06-08 DIAGNOSIS — M9903 Segmental and somatic dysfunction of lumbar region: Secondary | ICD-10-CM | POA: Diagnosis not present

## 2014-06-08 DIAGNOSIS — M9902 Segmental and somatic dysfunction of thoracic region: Secondary | ICD-10-CM | POA: Diagnosis not present

## 2014-06-08 DIAGNOSIS — M5412 Radiculopathy, cervical region: Secondary | ICD-10-CM | POA: Diagnosis not present

## 2014-06-08 DIAGNOSIS — M9901 Segmental and somatic dysfunction of cervical region: Secondary | ICD-10-CM | POA: Diagnosis not present

## 2014-06-08 DIAGNOSIS — M792 Neuralgia and neuritis, unspecified: Secondary | ICD-10-CM | POA: Diagnosis not present

## 2014-06-08 DIAGNOSIS — M5481 Occipital neuralgia: Secondary | ICD-10-CM | POA: Diagnosis not present

## 2014-06-08 DIAGNOSIS — M729 Fibroblastic disorder, unspecified: Secondary | ICD-10-CM | POA: Diagnosis not present

## 2014-06-16 DIAGNOSIS — M722 Plantar fascial fibromatosis: Secondary | ICD-10-CM | POA: Diagnosis not present

## 2014-06-20 DIAGNOSIS — M9902 Segmental and somatic dysfunction of thoracic region: Secondary | ICD-10-CM | POA: Diagnosis not present

## 2014-06-20 DIAGNOSIS — M5412 Radiculopathy, cervical region: Secondary | ICD-10-CM | POA: Diagnosis not present

## 2014-06-20 DIAGNOSIS — M729 Fibroblastic disorder, unspecified: Secondary | ICD-10-CM | POA: Diagnosis not present

## 2014-06-20 DIAGNOSIS — M792 Neuralgia and neuritis, unspecified: Secondary | ICD-10-CM | POA: Diagnosis not present

## 2014-06-20 DIAGNOSIS — M5481 Occipital neuralgia: Secondary | ICD-10-CM | POA: Diagnosis not present

## 2014-06-20 DIAGNOSIS — M9903 Segmental and somatic dysfunction of lumbar region: Secondary | ICD-10-CM | POA: Diagnosis not present

## 2014-06-20 DIAGNOSIS — M9901 Segmental and somatic dysfunction of cervical region: Secondary | ICD-10-CM | POA: Diagnosis not present

## 2014-06-23 DIAGNOSIS — M722 Plantar fascial fibromatosis: Secondary | ICD-10-CM | POA: Diagnosis not present

## 2014-06-23 DIAGNOSIS — L600 Ingrowing nail: Secondary | ICD-10-CM | POA: Diagnosis not present

## 2015-03-08 ENCOUNTER — Other Ambulatory Visit (HOSPITAL_BASED_OUTPATIENT_CLINIC_OR_DEPARTMENT_OTHER): Payer: Medicare Other

## 2015-03-08 ENCOUNTER — Ambulatory Visit (HOSPITAL_COMMUNITY)
Admission: RE | Admit: 2015-03-08 | Discharge: 2015-03-08 | Disposition: A | Discharge status: Home / Self Care | Payer: Medicare Other | Source: Ambulatory Visit | Attending: Hematology and Oncology | Admitting: Hematology and Oncology

## 2015-03-08 DIAGNOSIS — D472 Monoclonal gammopathy: Secondary | ICD-10-CM

## 2015-03-08 DIAGNOSIS — Z720 Tobacco use: Secondary | ICD-10-CM

## 2015-03-08 DIAGNOSIS — D890 Polyclonal hypergammaglobulinemia: Secondary | ICD-10-CM

## 2015-03-08 LAB — CBC WITH DIFFERENTIAL/PLATELET
BASO%: 0.7 % (ref 0.0–2.0)
Basophils Absolute: 0.1 10*3/uL (ref 0.0–0.1)
EOS ABS: 0.3 10*3/uL (ref 0.0–0.5)
EOS%: 2.8 % (ref 0.0–7.0)
HCT: 39.6 % (ref 34.8–46.6)
HEMOGLOBIN: 13.1 g/dL (ref 11.6–15.9)
LYMPH%: 43.2 % (ref 14.0–49.7)
MCH: 30 pg (ref 25.1–34.0)
MCHC: 33.1 g/dL (ref 31.5–36.0)
MCV: 90.8 fL (ref 79.5–101.0)
MONO#: 0.5 10*3/uL (ref 0.1–0.9)
MONO%: 6 % (ref 0.0–14.0)
NEUT%: 47.3 % (ref 38.4–76.8)
NEUTROS ABS: 4.2 10*3/uL (ref 1.5–6.5)
PLATELETS: 379 10*3/uL (ref 145–400)
RBC: 4.36 10*6/uL (ref 3.70–5.45)
RDW: 12.3 % (ref 11.2–14.5)
WBC: 8.9 10*3/uL (ref 3.9–10.3)
lymph#: 3.8 10*3/uL — ABNORMAL HIGH (ref 0.9–3.3)

## 2015-03-08 LAB — COMPREHENSIVE METABOLIC PANEL (CC13)
ALT: 15 U/L (ref 0–55)
ANION GAP: 10 meq/L (ref 3–11)
AST: 16 U/L (ref 5–34)
Albumin: 4.3 g/dL (ref 3.5–5.0)
Alkaline Phosphatase: 71 U/L (ref 40–150)
BUN: 14.1 mg/dL (ref 7.0–26.0)
CALCIUM: 10 mg/dL (ref 8.4–10.4)
CHLORIDE: 104 meq/L (ref 98–109)
CO2: 26 mEq/L (ref 22–29)
Creatinine: 0.8 mg/dL (ref 0.6–1.1)
EGFR: 75 mL/min/{1.73_m2} — ABNORMAL LOW (ref >90)
Glucose: 90 mg/dl (ref 70–140)
Potassium: 4.9 mEq/L (ref 3.5–5.1)
Sodium: 139 mEq/L (ref 136–145)
Total Bilirubin: 0.26 mg/dL (ref 0.20–1.20)
Total Protein: 7.2 g/dL (ref 6.4–8.3)

## 2015-03-08 LAB — LACTATE DEHYDROGENASE (CC13): LDH: 128 U/L (ref 125–245)

## 2015-03-13 LAB — SPEP & IFE WITH QIG
ABNORMAL PROTEIN BAND1: 0.3 g/dL
ALPHA-2-GLOBULIN: 0.8 g/dL (ref 0.5–0.9)
Albumin ELP: 4.5 g/dL (ref 3.8–4.8)
Alpha-1-Globulin: 0.3 g/dL (ref 0.2–0.3)
Beta 2: 0.3 g/dL (ref 0.2–0.5)
Beta Globulin: 0.5 g/dL (ref 0.4–0.6)
Gamma Globulin: 0.8 g/dL (ref 0.8–1.7)
IgA: 120 mg/dL (ref 69–380)
IgG (Immunoglobin G), Serum: 791 mg/dL (ref 690–1700)
IgM, Serum: 138 mg/dL (ref 52–322)
Total Protein, Serum Electrophoresis: 7.1 g/dL (ref 6.1–8.1)

## 2015-03-13 LAB — KAPPA/LAMBDA LIGHT CHAINS
KAPPA FREE LGHT CHN: 1.86 mg/dL (ref 0.33–1.94)
Kappa:Lambda Ratio: 1.41 (ref 0.26–1.65)
LAMBDA FREE LGHT CHN: 1.32 mg/dL (ref 0.57–2.63)

## 2015-03-15 ENCOUNTER — Encounter: Payer: Self-pay | Admitting: Hematology and Oncology

## 2015-03-15 ENCOUNTER — Ambulatory Visit (HOSPITAL_BASED_OUTPATIENT_CLINIC_OR_DEPARTMENT_OTHER): Payer: Medicare Other | Admitting: Hematology and Oncology

## 2015-03-15 ENCOUNTER — Telehealth: Payer: Self-pay | Admitting: Hematology and Oncology

## 2015-03-15 VITALS — BP 128/94 | HR 81 | Temp 98.1°F | Resp 18 | Ht 63.0 in | Wt 168.4 lb

## 2015-03-15 DIAGNOSIS — M150 Primary generalized (osteo)arthritis: Secondary | ICD-10-CM

## 2015-03-15 DIAGNOSIS — M159 Polyosteoarthritis, unspecified: Secondary | ICD-10-CM

## 2015-03-15 DIAGNOSIS — D472 Monoclonal gammopathy: Secondary | ICD-10-CM | POA: Insufficient documentation

## 2015-03-15 DIAGNOSIS — Z720 Tobacco use: Secondary | ICD-10-CM | POA: Diagnosis not present

## 2015-03-15 HISTORY — DX: Monoclonal gammopathy: D47.2

## 2015-03-15 NOTE — Telephone Encounter (Signed)
per pof to sch pt appt-gave pt copy of avs-adv to go to have xray after lab 2017

## 2015-03-15 NOTE — Assessment & Plan Note (Signed)
Clinically, she has no evidence of disease progression or end organ damage. I recommend yearly visit with history, physical examination, blood work and Marine scientist.

## 2015-03-15 NOTE — Assessment & Plan Note (Signed)
I recommend she continue taking high-dose vitamin D supplements.  I reviewed the skeletal survey in great detail. The lytic lesion seen in the pubic ramus does not correspond to the area of her pain. I recommend observation only

## 2015-03-15 NOTE — Progress Notes (Signed)
[facility] OFFICE PROGRESS NOTE  Patient Care Team: Harlan Stains, MD as PCP - General (Family Medicine) Bo Merino, MD as Consulting Physician (Rheumatology) Harlan Stains, MD as Attending Physician (Family Medicine)  SUMMARY OF ONCOLOGIC HISTORY:  [name] was transferred to my care after her prior physician has left.  I reviewed the patient's records extensive and collaborated the history with the patient. Summary of her history is as follows: She reports she still has persistent diffuse bone pain in her shoulders, low back, bilateral hips, bilateral knees. She recently has TENS unit implantation however has not had much improvement of her diffuse bone pain.  She has mild fatigue due to chronic bone pain. She had abnormal M spike detected with no evidence of lytic lesion. She is being observed. She denies recent infection. She continues to have chronic back pain and neck pain.  INTERVAL HISTORY: Please see below for problem oriented charting. She continues her persistent chronic pain. Denies recent infection. No recent bone fracture.  REVIEW OF SYSTEMS:   Constitutional: Denies fevers, chills or abnormal weight loss Eyes: Denies blurriness of vision Ears, nose, mouth, throat, and face: Denies mucositis or sore throat Respiratory: Denies cough, dyspnea or wheezes Cardiovascular: Denies palpitation, chest discomfort or lower extremity swelling Gastrointestinal:  Denies nausea, heartburn or change in bowel habits Skin: Denies abnormal skin rashes Lymphatics: Denies new lymphadenopathy or easy bruising Neurological:Denies numbness, tingling or new weaknesses Behavioral/Psych: Mood is stable, no new changes  All other systems were reviewed with the patient and are negative.  I have reviewed the past medical history, past surgical history, social history and family history with the patient and they are unchanged from previous note.  ALLERGIES:  has No  Known Allergies.  MEDICATIONS:  Current Outpatient Prescriptions  Medication Sig Dispense Refill  . acetaminophen (TYLENOL) 500 MG tablet Take 500 mg by mouth every 6 (six) hours as needed. Take 1000 mg daily    . ALPRAZolam (XANAX) 0.5 MG tablet Take 0.5 mg by mouth at bedtime as needed for anxiety.    Marland Kitchen aspirin EC 81 MG tablet Take 81 mg by mouth daily.    . Cholecalciferol (VITAMIN D-3) 5000 UNITS TABS Take 1 tablet by mouth daily.    . Coenzyme Q10 (CO Q 10) 100 MG CAPS Take 1 capsule by mouth daily.    Marland Kitchen desvenlafaxine (PRISTIQ) 100 MG 24 hr tablet Take 100 mg by mouth daily.    Marland Kitchen ibuprofen (ADVIL,MOTRIN) 200 MG tablet Take 400 mg by mouth every 8 (eight) hours as needed (400 mg to 800 mg 2 to 3 times daily).    Marland Kitchen losartan (COZAAR) 50 MG tablet Take 50 mg by mouth daily.    . magnesium gluconate (MAGONATE) 30 MG tablet Take 30 mg by mouth. 3 tablets daily    . metoprolol succinate (TOPROL-XL) 100 MG 24 hr tablet Take 100 mg by mouth daily. Take with or immediately following a meal.    . Omega-3 Fatty Acids (FISH OIL PO) Take 700 mg by mouth daily.    Marland Kitchen omeprazole (PRILOSEC) 20 MG capsule Take 20 mg by mouth daily.    Marland Kitchen OVER THE COUNTER MEDICATION daily. Simplex-F  Take 1 tablet daily    . rosuvastatin (CRESTOR) 10 MG tablet Take 10 mg by mouth daily.    . tizanidine (ZANAFLEX) 2 MG capsule Take 2 mg by mouth every 4 (four) hours as needed for muscle spasms.     No current facility-administered medications for  this visit.    PHYSICAL EXAMINATION: ECOG PERFORMANCE STATUS: 1 - Symptomatic but completely ambulatory  Filed Vitals:   03/15/15 1043  BP: 128/94  Pulse: 81  Temp: 98.1 F (36.7 C)  Resp: 18   Filed Weights   03/15/15 1043  Weight: 168 lb 6.4 oz (76.386 kg)    GENERAL:alert, no distress and comfortable SKIN: skin color, texture, turgor are normal, no rashes or significant lesions EYES: normal, Conjunctiva are pink and non-injected, sclera clear OROPHARYNX:no  exudate, no erythema and lips, buccal mucosa, and tongue normal  NECK: supple, thyroid normal size, non-tender, without nodularity LYMPH:  no palpable lymphadenopathy in the cervical, axillary or inguinal LUNGS: clear to auscultation and percussion with normal breathing effort HEART: regular rate & rhythm and no murmurs and no lower extremity edema ABDOMEN:abdomen soft, non-tender and normal bowel sounds Musculoskeletal:no cyanosis of digits and no clubbing. She has mild tenderness on palpation on the spine  NEURO: alert & oriented x 3 with fluent speech, no focal motor/sensory deficits  LABORATORY DATA:  I have reviewed the data as listed    Component Value Date/Time   NA 139 03/08/2015 1109   NA 134* 12/11/2011 1405   K 4.9 03/08/2015 1109   K 4.5 12/11/2011 1405   CL 103 06/11/2012 1059   CL 99 12/11/2011 1405   CO2 26 03/08/2015 1109   CO2 25 12/11/2011 1405   GLUCOSE 90 03/08/2015 1109   GLUCOSE 123* 06/11/2012 1059   GLUCOSE 88 12/11/2011 1405   BUN 14.1 03/08/2015 1109   BUN 14 12/11/2011 1405   CREATININE 0.8 03/08/2015 1109   CREATININE 0.84 12/11/2011 1405   CALCIUM 10.0 03/08/2015 1109   CALCIUM 9.8 12/11/2011 1405   PROT 7.2 03/08/2015 1109   PROT 6.8 12/11/2011 1405   ALBUMIN 4.3 03/08/2015 1109   ALBUMIN 4.5 12/11/2011 1405   AST 16 03/08/2015 1109   AST 11 12/11/2011 1405   ALT 15 03/08/2015 1109   ALT 9 12/11/2011 1405   ALKPHOS 71 03/08/2015 1109   ALKPHOS 70 12/11/2011 1405   BILITOT 0.26 03/08/2015 1109   BILITOT 0.4 12/11/2011 1405    No results found for: SPEP, UPEP  Lab Results  Component Value Date   WBC 8.9 03/08/2015   NEUTROABS 4.2 03/08/2015   HGB 13.1 03/08/2015   HCT 39.6 03/08/2015   MCV 90.8 03/08/2015   PLT 379 03/08/2015      Chemistry      Component Value Date/Time   NA 139 03/08/2015 1109   NA 134* 12/11/2011 1405   K 4.9 03/08/2015 1109   K 4.5 12/11/2011 1405   CL 103 06/11/2012 1059   CL 99 12/11/2011 1405   CO2 26  03/08/2015 1109   CO2 25 12/11/2011 1405   BUN 14.1 03/08/2015 1109   BUN 14 12/11/2011 1405   CREATININE 0.8 03/08/2015 1109   CREATININE 0.84 12/11/2011 1405      Component Value Date/Time   CALCIUM 10.0 03/08/2015 1109   CALCIUM 9.8 12/11/2011 1405   ALKPHOS 71 03/08/2015 1109   ALKPHOS 70 12/11/2011 1405   AST 16 03/08/2015 1109   AST 11 12/11/2011 1405   ALT 15 03/08/2015 1109   ALT 9 12/11/2011 1405   BILITOT 0.26 03/08/2015 1109   BILITOT 0.4 12/11/2011 1405       RADIOGRAPHIC STUDIES: I review the skeletal survey with the patient I have personally reviewed the radiological images as listed and agreed with the findings in the report.  ASSESSMENT & PLAN:  MGUS (monoclonal gammopathy of unknown significance) Clinically, she has no evidence of disease progression or end organ damage. I recommend yearly visit with history, physical examination, blood work and Marine scientist.   Osteoarthritis I recommend she continue taking high-dose vitamin D supplements.  I reviewed the skeletal survey in great detail. The lytic lesion seen in the pubic ramus does not correspond to the area of her pain. I recommend observation only    Tobacco abuse I spent some time counseling the patient the importance of tobacco cessation. she is currently attempting to quit on her own  I gave her patient education handout and encouraged her to sign up for smoking cessation class.      Orders Placed This Encounter  Procedures  . DG Bone Survey Met    Standing Status: Future     Number of Occurrences:      Standing Expiration Date: 05/14/2016    Order Specific Question:  Reason for Exam (SYMPTOM  OR DIAGNOSIS REQUIRED)    Answer:  staging myeloma    Order Specific Question:  Is the patient pregnant?    Answer:  No    Order Specific Question:  Preferred imaging location?    Answer:  [facility]  . CBC with Differential/Platelet    Standing Status: Future     Number of  Occurrences:      Standing Expiration Date: 05/14/2016  . Comprehensive metabolic panel    Standing Status: Future     Number of Occurrences:      Standing Expiration Date: 05/14/2016  . SPEP & IFE with QIG    Standing Status: Future     Number of Occurrences:      Standing Expiration Date: 05/14/2016  . Kappa/lambda light chains    Standing Status: Future     Number of Occurrences:      Standing Expiration Date: 05/14/2016   All questions were answered. The patient knows to call the clinic with any problems, questions or concerns. No barriers to learning was detected. I spent 15 minutes counseling the patient face to face. The total time spent in the appointment was 20 minutes and more than 50% was on counseling and review of test results     [facility], [place], MD 03/15/2015 5:51 PM

## 2015-03-15 NOTE — Assessment & Plan Note (Signed)
I spent some time counseling the patient the importance of tobacco cessation. she is currently attempting to quit on her own  I gave her patient education handout and encouraged her to sign up for smoking cessation class.  

## 2015-06-28 ENCOUNTER — Other Ambulatory Visit: Payer: Self-pay

## 2015-06-28 DIAGNOSIS — Z1231 Encounter for screening mammogram for malignant neoplasm of breast: Secondary | ICD-10-CM

## 2015-07-03 ENCOUNTER — Encounter: Payer: Self-pay | Admitting: Nurse Practitioner

## 2015-07-03 ENCOUNTER — Ambulatory Visit (INDEPENDENT_AMBULATORY_CARE_PROVIDER_SITE_OTHER): Payer: Medicare Other | Admitting: Nurse Practitioner

## 2015-07-03 VITALS — BP 144/92 | HR 84 | Temp 97.5°F | Ht 63.0 in | Wt 170.0 lb

## 2015-07-03 DIAGNOSIS — R300 Dysuria: Secondary | ICD-10-CM

## 2015-07-03 DIAGNOSIS — N993 Prolapse of vaginal vault after hysterectomy: Secondary | ICD-10-CM | POA: Diagnosis not present

## 2015-07-03 DIAGNOSIS — R350 Frequency of micturition: Secondary | ICD-10-CM

## 2015-07-03 LAB — POCT URINALYSIS DIPSTICK
Bilirubin, UA: NEGATIVE
Glucose, UA: NEGATIVE
Ketones, UA: NEGATIVE
LEUKOCYTES UA: NEGATIVE
NITRITE UA: NEGATIVE
PH UA: 7
PROTEIN UA: NEGATIVE
RBC UA: NEGATIVE
UROBILINOGEN UA: NEGATIVE

## 2015-07-03 NOTE — Progress Notes (Signed)
Subjective:     Patient ID: [name], female   DOB: [birth year], 59 y.o.   MRN: [id number]  HPI  This 59 yo G64P2 WP female with concerns of pelvic prolapse.  She has had an increasing sense of a bulge that is getting larger.  She particularly notes when standing and with any lifting.  She does have stress incontinence.  No recent long trips of walking or other prolonged standing that has made this worse.  She has a feeling of fulness and at times urgency with a "pulling sensation" of the bladder.  She is post [facility] secondary to fibroids and adenomyosis in 05/2004.  She denies dysuria.  Her partner is with her today.  Other chronic history of back and neck pain that is unchanged.   Review of Systems  Constitutional: Negative.   HENT: Negative.   Respiratory: Negative.  Negative for cough, chest tightness and shortness of breath.   Cardiovascular: Negative.   Gastrointestinal: Negative.   Genitourinary: Positive for urgency, frequency and enuresis. Negative for flank pain, decreased urine volume, vaginal bleeding, vaginal discharge, vaginal pain and pelvic pain.  Musculoskeletal: Positive for back pain, joint swelling, gait problem and neck pain.  Skin: Negative.   Neurological: Negative.   Psychiatric/Behavioral: Negative.        Objective:   Physical Exam  Constitutional: She appears well-developed and well-nourished. No distress.  Partner in room during the exam.  Pulmonary/Chest: Effort normal.  Abdominal: Soft. She exhibits no distension and no mass. There is no tenderness. There is no guarding.  Genitourinary:  On initial exam she has no prolapse.  After insertion of the speculum and on removal a lot of vaginal prolapse is evident.  On bimanual there is a grade I cystocele and rectocele.  After exam on standing the prolapse comes 1 fingerbreadth below the introitus.       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 [name] Discussed other treatment options such as pelvic PT.  Pessaries were discussed and she is shown the varius types.  She will need more information if surgical is the best option. Will follow with urine culture

## 2015-07-03 NOTE — Patient Instructions (Addendum)

## 2015-07-04 LAB — URINE CULTURE
Colony Count: NO GROWTH
Organism ID, Bacteria: NO GROWTH

## 2015-07-04 NOTE — Progress Notes (Signed)
Encounter reviewed Sarabeth Benton, MD   

## 2015-07-20 ENCOUNTER — Ambulatory Visit (INDEPENDENT_AMBULATORY_CARE_PROVIDER_SITE_OTHER): Payer: Medicare Other | Admitting: Obstetrics and Gynecology

## 2015-07-20 ENCOUNTER — Encounter: Payer: Self-pay | Admitting: Obstetrics and Gynecology

## 2015-07-20 VITALS — BP 128/82 | HR 66 | Ht 63.0 in | Wt 172.0 lb

## 2015-07-20 DIAGNOSIS — N816 Rectocele: Secondary | ICD-10-CM

## 2015-07-20 DIAGNOSIS — N393 Stress incontinence (female) (male): Secondary | ICD-10-CM | POA: Diagnosis not present

## 2015-07-20 DIAGNOSIS — N8110 Cystocele, unspecified: Secondary | ICD-10-CM | POA: Diagnosis not present

## 2015-07-20 DIAGNOSIS — IMO0002 Reserved for concepts with insufficient information to code with codable children: Secondary | ICD-10-CM

## 2015-07-20 NOTE — Progress Notes (Signed)
Patient ID: [name], female   DOB: Jun 19, 1956, 60 y.o.   MRN: [id number] GYNECOLOGY  VISIT   HPI: 60 y.o.   Significant Other Caucasian  female   [id number] with Patient's last menstrual period was 03/07/2004 (approximate).   here for evaluation of pelvic prolapse and stress urinary incontinence.  Status post TVH for prolapse and adenomyosis in 2004.   Female partner "Gerald Stabs" is here today.   Has prolapse for about one month. Came on suddenly.  Has some burning pain which does not last long and comes and goes. Has some pain lasting a second after emptying her bladder.  Voiding well without straining.  Always has had urinary incontinence, can be associated with urgency and frequency.  Some leak with laugh or cough. DF - up to every 30 minutes. NF - 3 - 4 times a night but now 2 times a night.  No enuresis. Wears protection.  Denies hematuria or stones. 2 UTIs in the last year.  Caffeine - 3 per day.   No constipation or fecal incontinence.   Wears a Tens unit for cervical spine and lower back pain.   GYNECOLOGIC HISTORY: Patient's last menstrual period was 03/07/2004 (approximate). Contraception:Hysterectomy--ovaries remain Menopausal hormone therapy: none Last mammogram: 12-06-13 Density Cat.B/Neg/BiRads1:[facility].  Has appt. Scheduled 07-26-15. Last pap smear: 2005 normal per patient        OB History    Gravida Para Term Preterm AB TAB SAB Ectopic Multiple Living   3 2 2  0 1 0 1 0 0 2         Patient Active Problem List   Diagnosis Date Noted  . MGUS (monoclonal gammopathy of unknown significance) 03/15/2015  . Tobacco abuse 03/14/2014  . Monoclonal (M) protein disease, multiple 'M' protein   . Osteoarthritis   . Mitral valve prolapse   . HTN (hypertension)   . Hyperlipidemia     Past Medical History  Diagnosis Date  . Hyperlipidemia   . HTN (hypertension)   . Mitral valve prolapse   . GERD (gastroesophageal reflux disease)   . Depression    . Osteoarthritis   . Monoclonal (M) protein disease, multiple 'M' protein   . Cervical disc disease   . Migraine headache   . MGUS (monoclonal gammopathy of unknown significance) 03/15/2015    Past Surgical History  Procedure Laterality Date  . Knee arthroscopy      right knee x 2  . Meniscus repair      right knee x 1  . Cervical disc surgery  6/06  . Tonsilectomy, adenoidectomy, bilateral myringotomy and tubes    . Breast biopsy      negative x 2   . Appendectomy  1974  . Tubal ligation    . Total vaginal hysterectomy  05/2004    adenomyosis, prolapse  . Bunionectomy      w/hammer toe repair    Current Outpatient Prescriptions  Medication Sig Dispense Refill  . acetaminophen (TYLENOL) 500 MG tablet Take 500 mg by mouth every 6 (six) hours as needed. Take 1000 mg daily    . ALPRAZolam (XANAX) 0.5 MG tablet Take 0.5 mg by mouth at bedtime as needed for anxiety.    Marland Kitchen aspirin EC 81 MG tablet Take 81 mg by mouth daily.    . Cholecalciferol (VITAMIN D-3) 5000 UNITS TABS Take 1 tablet by mouth daily.    . Coenzyme Q10 (CO Q 10) 100 MG CAPS Take 1 capsule by mouth daily.    Marland Kitchen  desvenlafaxine (PRISTIQ) 100 MG 24 hr tablet Take 100 mg by mouth daily.    Marland Kitchen ibuprofen (ADVIL,MOTRIN) 200 MG tablet Take 400 mg by mouth every 8 (eight) hours as needed (400 mg to 800 mg 2 to 3 times daily).    Marland Kitchen losartan (COZAAR) 50 MG tablet Take 50 mg by mouth daily.    . magnesium gluconate (MAGONATE) 30 MG tablet Take 30 mg by mouth. 3 tablets daily    . metoprolol succinate (TOPROL-XL) 100 MG 24 hr tablet Take 100 mg by mouth daily. Take with or immediately following a meal.    . Omega-3 Fatty Acids (FISH OIL PO) Take 700 mg by mouth daily.    Marland Kitchen omeprazole (PRILOSEC) 20 MG capsule Take 20 mg by mouth daily.    Marland Kitchen OVER THE COUNTER MEDICATION daily. Simplex-F  Take 1 tablet daily    . tizanidine (ZANAFLEX) 2 MG capsule Take 2 mg by mouth every 4 (four) hours as needed for muscle spasms.     No current  facility-administered medications for this visit.     ALLERGIES: Review of patient's allergies indicates no known allergies.  Family History  Problem Relation Age of Onset  . Heart failure Mother     congestive  . Diabetes Mother   . Hypertension Mother   . Heart disease Father     MI  . Diabetes Father   . Heart attack Father   . Cancer Brother 55    lung cancer  . Cancer Brother     metastasized, unknown origin  . Colon cancer Paternal Grandmother   . Diabetes Brother     Social History   Social History  . Marital Status: Significant Other    Spouse Name: N/A  . Number of Children: 2  . Years of Education: N/A   Occupational History  .      Intake coordinator   Social History Main Topics  . Smoking status: Current Every Day Smoker -- 0.50 packs/day for 35 years  . Smokeless tobacco: Never Used  . Alcohol Use: No  . Drug Use: No  . Sexual Activity: No     Comment: Hyst   Other Topics Concern  . Not on file   Social History Narrative    ROS:  Pertinent items are noted in HPI.  PHYSICAL EXAMINATION:    BP 128/82 mmHg  Pulse 66  Ht 5\' 3"  (1.6 m)  Wt 172 lb (78.019 kg)  BMI 30.48 kg/m2  LMP 03/07/2004 (Approximate)    General appearance: alert, cooperative and appears stated age    Abdomen: right paramedian incision, soft, non-tender;   no masses,  no organomegaly   Pelvic: External genitalia:  no lesions              Urethra:  normal appearing urethra with no masses, tenderness or lesions              Bartholins and Skenes: normal                 Vagina: normal appearing vagina with normal color and discharge, no lesions.  Almost third degree cystocele and second degree rectocele.   Apex feels well supported.              Cervix: absent             Bimanual Exam:  Uterus:  uterus absent              Adnexa: normal adnexa and no mass,  fullness, tenderness              Rectovaginal: Yes.  .  Confirms.              Anus:  normal sphincter tone, no  lesions  Chaperone was present for exam.  ASSESSMENT  Third degree cystocele.  Second degree rectocele.  Stress incontinence.  Status post TVH for prolapse and adenomyosis.  Status post BTL. Chronic neck and back pain.    PLAN  I have had a comprehensive discussion with the patient regarding prolapse and urinary incontinence.  I have provided reading materials from [employer] regarding prolapse and incontinence in general as well as medical and surgical treatment for these conditions. Medical treatments may include physical therapy, pessary use, anticholinergic/antimuscarinic therapy.   Surgical care would include a vaginal approach with anterior and posterior colporrhaphy with possible sacrospinous fixation using native tissue repair or augmentation with bovine or porcine graft, and TVT midurethral sling and cystoscopy.   We discussed multichannel urodynamic testing to evaluate bladder function prior to surgery and determine need for a midurethral sling.    I have had a general discussion of surgical expectations regarding the procedures and success rates, outcomes, and recovery.     Will proceed forward with urodynamic testing.   An After Visit Summary was printed and given to the patient.  __25____ minutes face to face time of which over 50% was spent in counseling.

## 2015-07-20 NOTE — Patient Instructions (Signed)
We will call to schedule the urodynamic testing after we have insurance approval.

## 2015-07-26 ENCOUNTER — Ambulatory Visit
Admission: RE | Admit: 2015-07-26 | Discharge: 2015-07-26 | Disposition: A | Discharge status: Home / Self Care | Payer: Medicare Other | Source: Ambulatory Visit

## 2015-07-26 DIAGNOSIS — Z1231 Encounter for screening mammogram for malignant neoplasm of breast: Secondary | ICD-10-CM

## 2015-08-13 ENCOUNTER — Telehealth: Payer: Self-pay | Admitting: Emergency Medicine

## 2015-08-13 ENCOUNTER — Encounter: Payer: Self-pay | Admitting: Emergency Medicine

## 2015-08-13 NOTE — Telephone Encounter (Signed)
Call to patient. To schedule pre-urodynamics urinalysis and follow up.   She states she will call back in 20 minutes.

## 2015-08-13 NOTE — Telephone Encounter (Signed)
Spoke with patient and urodynamics instructions given. Scheduled urodynamics procedure for 08/22/15 at 0930 Advised to stop all bladder medications one week prior to procedure, patient states she is not on any medications. Arrive with comfortably full bladder.  Advised will need pre procedure UA to check for any infection prior. Scheduled for 08/17/15 at 2:30 Scheduled follow up with Dr. [name] for 08/24/15 at 0930 Brief description of procedure given. Letter with instructions mailed to address of record. Copy in Epic.  Patient verbalizes understanding of instructions and agreeable to appointments as scheduled.  Routing to provider for final review. Patient agreeable to disposition. Will close encounter.

## 2015-08-13 NOTE — Telephone Encounter (Signed)
Patient returning your call [phone].

## 2015-08-17 ENCOUNTER — Ambulatory Visit (INDEPENDENT_AMBULATORY_CARE_PROVIDER_SITE_OTHER): Payer: Medicare Other

## 2015-08-17 VITALS — BP 132/78 | HR 72 | Ht 63.0 in | Wt 173.8 lb

## 2015-08-17 DIAGNOSIS — N393 Stress incontinence (female) (male): Secondary | ICD-10-CM

## 2015-08-17 LAB — POCT URINALYSIS DIPSTICK
BILIRUBIN UA: NEGATIVE
Glucose, UA: NEGATIVE
KETONES UA: NEGATIVE
LEUKOCYTES UA: NEGATIVE
Nitrite, UA: NEGATIVE
PH UA: 7.5
PROTEIN UA: NEGATIVE
RBC UA: NEGATIVE
Urobilinogen, UA: NEGATIVE

## 2015-08-17 NOTE — Progress Notes (Signed)
Patient here for urinalysis today.  She is scheduled for urodynamics testing 08-22-15.    Urine [id number]  Patient advised to keep appointment next week for urodynamics.

## 2015-08-22 ENCOUNTER — Ambulatory Visit (INDEPENDENT_AMBULATORY_CARE_PROVIDER_SITE_OTHER): Payer: Medicare Other | Admitting: Obstetrics and Gynecology

## 2015-08-22 DIAGNOSIS — N393 Stress incontinence (female) (male): Secondary | ICD-10-CM | POA: Diagnosis not present

## 2015-08-22 NOTE — Progress Notes (Signed)
Patient here for urodynamic testing. See report attached.

## 2015-08-24 ENCOUNTER — Ambulatory Visit (INDEPENDENT_AMBULATORY_CARE_PROVIDER_SITE_OTHER): Payer: Medicare Other | Admitting: Obstetrics and Gynecology

## 2015-08-24 ENCOUNTER — Encounter: Payer: Self-pay | Admitting: Obstetrics and Gynecology

## 2015-08-24 VITALS — BP 122/76 | HR 66 | Resp 18 | Wt 173.0 lb

## 2015-08-24 DIAGNOSIS — N3946 Mixed incontinence: Secondary | ICD-10-CM | POA: Diagnosis not present

## 2015-08-24 DIAGNOSIS — IMO0002 Reserved for concepts with insufficient information to code with codable children: Secondary | ICD-10-CM

## 2015-08-24 DIAGNOSIS — N816 Rectocele: Secondary | ICD-10-CM

## 2015-08-24 DIAGNOSIS — N8110 Cystocele, unspecified: Secondary | ICD-10-CM

## 2015-08-24 NOTE — Progress Notes (Signed)
GYNECOLOGY  VISIT   HPI: 60 y.o.   Significant Other Caucasian  female   [id number] with Patient's last menstrual period was 03/07/2004 (approximate).   here for  Review of urodynamic testing.  Partner, Gerald Stabs, is present for entire visit also.  Has some pressure and discomfort due to prolapse.  Difficult to maintain pessary with the urodynamic testing.   Always has had urinary incontinence, can be associated with urgency and frequency.  Some leak with laugh or cough.  Almost third degree cystocele and second degree rectocele. Apex well supported on examination. Status post TVH for prolapse and adenomyosis in 2004.   Multichannel urodynamic testing done 08/22/15 with reduction of prolapse with a pessary. Uroflow: Void 238 cc.  PVR 210 cc.  Continuous flow.  CMG:  S1 17 cc, S2 488 cc, S3 985 cc.  VLPP  75 cm H2O at 995 cc.  Evidence of detrusor contraction but not with leak.  Looks like a lot of artifact from movement.  UPP:  17 cm H2O. Pressure flow:  Void 1128 cc.  Pdet Max 10 cm H2O.   Has chronic pain in neck and has sciatica.  Uses a TENS unit.  GYNECOLOGIC HISTORY: Patient's last menstrual period was 03/07/2004 (approximate).  Contraception:Hysterectomy--ovaries remain Menopausal hormone therapy: none Last mammogram: 12-06-13 Density Cat.B/Neg/BiRads1:[facility]. Has appt. Scheduled 07-26-15. Last pap smear: 2005 normal per patient        OB History    Gravida Para Term Preterm AB TAB SAB Ectopic Multiple Living   3 2 2  0 1 0 1 0 0 2         Patient Active Problem List   Diagnosis Date Noted  . MGUS (monoclonal gammopathy of unknown significance) 03/15/2015  . Tobacco abuse 03/14/2014  . Monoclonal (M) protein disease, multiple 'M' protein   . Osteoarthritis   . Mitral valve prolapse   . HTN (hypertension)   . Hyperlipidemia     Past Medical History  Diagnosis Date  . Hyperlipidemia   . HTN (hypertension)   . Mitral valve prolapse   . GERD  (gastroesophageal reflux disease)   . Depression   . Osteoarthritis   . Monoclonal (M) protein disease, multiple 'M' protein   . Cervical disc disease   . Migraine headache   . MGUS (monoclonal gammopathy of unknown significance) 03/15/2015    Past Surgical History  Procedure Laterality Date  . Knee arthroscopy      right knee x 2  . Meniscus repair      right knee x 1  . Cervical disc surgery  6/06  . Tonsilectomy, adenoidectomy, bilateral myringotomy and tubes    . Breast biopsy      negative x 2   . Appendectomy  1974  . Tubal ligation    . Total vaginal hysterectomy  05/2004    adenomyosis, prolapse--ovaries remain  . Bunionectomy      w/hammer toe repair    Current Outpatient Prescriptions  Medication Sig Dispense Refill  . acetaminophen (TYLENOL) 500 MG tablet Take 500 mg by mouth at bedtime as needed. Take 1000 mg daily    . ALPRAZolam (XANAX) 0.5 MG tablet Take 0.5 mg by mouth at bedtime as needed for anxiety.    Marland Kitchen aspirin EC 81 MG tablet Take 81 mg by mouth daily.    . Cholecalciferol (VITAMIN D-3) 5000 UNITS TABS Take 1 tablet by mouth daily.    . Coenzyme Q10 (CO Q 10) 100 MG CAPS Take 1 capsule  by mouth daily.    Marland Kitchen desvenlafaxine (PRISTIQ) 100 MG 24 hr tablet Take 100 mg by mouth daily.    Marland Kitchen ibuprofen (ADVIL,MOTRIN) 200 MG tablet Take 400 mg by mouth every 8 (eight) hours as needed (400 mg to 800 mg 2 to 3 times daily).    Marland Kitchen losartan (COZAAR) 50 MG tablet Take 50 mg by mouth daily.    . magnesium gluconate (MAGONATE) 30 MG tablet Take 30 mg by mouth. 3 tablets daily    . metoprolol succinate (TOPROL-XL) 100 MG 24 hr tablet Take 100 mg by mouth daily. Take with or immediately following a meal.    . Omega-3 Fatty Acids (FISH OIL PO) Take 700 mg by mouth daily.    Marland Kitchen omeprazole (PRILOSEC) 20 MG capsule Take 20 mg by mouth daily.    Marland Kitchen OVER THE COUNTER MEDICATION daily. Reported on 08/22/2015    . tizanidine (ZANAFLEX) 2 MG capsule Take 2 mg by mouth every 4 (four) hours  as needed for muscle spasms.     No current facility-administered medications for this visit.     ALLERGIES: Review of patient's allergies indicates no known allergies.  Family History  Problem Relation Age of Onset  . Heart failure Mother     congestive  . Diabetes Mother   . Hypertension Mother   . Heart disease Father     MI  . Diabetes Father   . Heart attack Father   . Cancer Brother 81    lung cancer  . Cancer Brother     metastasized, unknown origin  . Colon cancer Paternal Grandmother   . Diabetes Brother     Social History   Social History  . Marital Status: Significant Other    Spouse Name: N/A  . Number of Children: 2  . Years of Education: N/A   Occupational History  .      Intake coordinator   Social History Main Topics  . Smoking status: Current Every Day Smoker -- 0.50 packs/day for 35 years  . Smokeless tobacco: Never Used  . Alcohol Use: No  . Drug Use: No  . Sexual Activity: No     Comment: Hyst   Other Topics Concern  . Not on file   Social History Narrative    ROS:  Pertinent items are noted in HPI.  PHYSICAL EXAMINATION:    BP 122/76 mmHg  Pulse 66  Resp 18  Wt 173 lb (78.472 kg)  LMP 03/07/2004 (Approximate)    General appearance: alert, cooperative and appears stated age  ASSESSMENT  Cystocele.  Rectocele.  Good apical support.  Genuine stress incontinence.  Detrusor contractions on urodynamics. Chronic neck pain and sciatica.  Uses TENS unit. HTN.  PLAN  Counseled regarding urodynamic testing and mixed incontinence.  Counseled regarding cystocele and rectocele.  I discussed medical and surgical options for prolapse and incontinence. Medical options include physical therapy, anticholinergic/antimuscarinic tx, pessary use. Surgical care would include an anterior and posterior colporrhaphy with native tissue repair, TVT Exact midurethral sling and cystoscopy.   Benefits and risks of surgery discussed.   Risks of  surgery include but are not limited tobleeding, infection, damage to surrounding organs, ureteral damage, vaginal pain with sexual activity, permanent mesh use which may cause erosion and exposure in the vagina, urethra, bladder or ureters, dyspareunia, slower voiding and urinary retention, possible need for prolonged catheterization and/or self catheterization, de novo overactive bladder symptoms, reoperation, recurrence of prolapse and incontinence,  DVT, PE, death, and reaction  to anesthesia.    I have discussed surgical expectations regarding the procedures and success rates, outcomes, and recovery.     Patient wishes to proceed forward with surgical precert and scheduling.  An After Visit Summary was printed and given to the patient.  ___25___ minutes face to face time of which over 50% was spent in counseling.

## 2015-08-25 NOTE — Progress Notes (Signed)
Patient ID: [name], female   DOB: Dec 14, 1955, 60 y.o.   MRN: [id number]  Encounter reviewed by Dr. Aundria Rud.  Multichannel urodynamic testing done 08/22/15 with reduction of prolapse with a pessary. Uroflow: Void 238 cc.  PVR 210 cc.  Continuous flow.  CMG:  S1 17 cc, S2 488 cc, S3 985 cc.  VLPP  75 cm H2O at 995 cc.  Evidence of detrusor contraction but not with leak.  Looks like a lot of artifact from movement.  UPP:  17 cm H2O. Pressure flow:  Void 1128 cc.  Pdet Max 10 cm H2O.   Mixed incontinence picture.

## 2015-08-28 ENCOUNTER — Telehealth: Payer: Self-pay | Admitting: Obstetrics and Gynecology

## 2015-08-28 NOTE — Telephone Encounter (Signed)
Spoke with pt regarding benefit for surgery. Patient understood and agreeable. Patient ready to schedule. Patient provided surgery deposit over the phone. Patient aware this is professional benefit only. Patient aware will be contacted by hospital for separate benefit. Chart to [facility] for scheduling

## 2015-08-28 NOTE — Telephone Encounter (Signed)
Spoke with pt regarding benefit for surgery. Patient understood and agreeable. Patient aware this is professional benefit only. Patient has requested to contact the [place] to get an estimated benefit from their facility, before scheduling. Pt to contact out office, once she has spoken to the [place]

## 2015-08-28 NOTE — Telephone Encounter (Signed)
Patient returning your call.

## 2015-09-04 ENCOUNTER — Telehealth: Payer: Self-pay | Admitting: *Deleted

## 2015-09-04 NOTE — Telephone Encounter (Signed)
Call to patient to discuss surgery dates. Patient agreeable to either 10-16-15 or 10-30-15. Prefers 10-16-15 and prefers mid-morning. Advised will schedule and call patient back with confirmation.

## 2015-09-11 NOTE — Telephone Encounter (Signed)
Call to patient. Advised surgery scheduled for 10-16-15 at 1015 at [facility]. Surgery instruction sheet reviewed and printed copy mailed to patient, see scanned copy.   Routing to provider for final review. Patient agreeable to disposition. Will close encounter.

## 2015-09-17 ENCOUNTER — Encounter: Payer: Self-pay | Admitting: Obstetrics and Gynecology

## 2015-09-17 ENCOUNTER — Ambulatory Visit (INDEPENDENT_AMBULATORY_CARE_PROVIDER_SITE_OTHER): Payer: Medicare Other | Admitting: Obstetrics and Gynecology

## 2015-09-17 VITALS — BP 142/80 | HR 72 | Resp 16 | Ht 63.0 in

## 2015-09-17 DIAGNOSIS — N816 Rectocele: Secondary | ICD-10-CM

## 2015-09-17 DIAGNOSIS — N8110 Cystocele, unspecified: Secondary | ICD-10-CM

## 2015-09-17 DIAGNOSIS — N393 Stress incontinence (female) (male): Secondary | ICD-10-CM | POA: Diagnosis not present

## 2015-09-17 DIAGNOSIS — IMO0002 Reserved for concepts with insufficient information to code with codable children: Secondary | ICD-10-CM

## 2015-09-17 NOTE — Progress Notes (Signed)
Patient ID: [name], female   DOB: [birth year], 60 y.o.   MRN: [id number] GYNECOLOGY  VISIT   HPI: 60 y.o.   Significant Other  Caucasian  female   [id number] with Patient's last menstrual period was 03/07/2004 (approximate).   here for surgical consult.    Partner, [name], is present for entire visit also.  Has some pressure and discomfort due to prolapse.  Difficult to maintain pessary with the urodynamic testing.   Always has had urinary incontinence, can be associated with urgency and frequency.  Some leak with laugh or cough.  Almost third degree cystocele and second degree rectocele. Apex well supported on examination. Status post TVH for prolapse and adenomyosis in 2004.   Multichannel urodynamic testing done 08/22/15 with reduction of prolapse with a pessary. Uroflow: Void 238 cc. PVR 210 cc. Continuous flow.  CMG: S1 17 cc, S2 488 cc, S3 985 cc. VLPP 75 cm H2O at 995 cc. Evidence of detrusor contraction but not with leak. Looks like a lot of artifact from movement.  UPP: 17 cm H2O. Pressure flow: Void 1128 cc. Pdet Max 10 cm H2O.   Has chronic pain in neck and has sciatica.  Uses a TENS unit.  Percocet causes headaches.  Vicodin causes sleepiness and knocks her out  Tramadol causes sleepiness.   GYNECOLOGIC HISTORY: Patient's last menstrual period was 03/07/2004 (approximate). Contraception:Hysterectomy Menopausal hormone therapy: none Last mammogram: 07-26-15 3D/Density Cat.B/Neg/BiRads1:[facility] Last pap smear: 2005 normal per patient        OB History    Gravida Para Term Preterm AB TAB SAB Ectopic Multiple Living   3 2 2  0 1 0 1 0 0 2         Patient Active Problem List   Diagnosis Date Noted  . MGUS (monoclonal gammopathy of unknown significance) 03/15/2015  . Tobacco abuse 03/14/2014  . Monoclonal (M) protein disease, multiple 'M' protein   . Osteoarthritis   . Mitral valve prolapse   . HTN (hypertension)   .  Hyperlipidemia     Past Medical History  Diagnosis Date  . Hyperlipidemia   . HTN (hypertension)   . Mitral valve prolapse   . GERD (gastroesophageal reflux disease)   . Depression   . Osteoarthritis   . Monoclonal (M) protein disease, multiple 'M' protein   . Cervical disc disease   . Migraine headache   . MGUS (monoclonal gammopathy of unknown significance) 03/15/2015    Past Surgical History  Procedure Laterality Date  . Knee arthroscopy      right knee x 2  . Meniscus repair      right knee x 1  . Cervical disc surgery  6/06  . Tonsilectomy, adenoidectomy, bilateral myringotomy and tubes    . Breast biopsy      negative x 2   . Appendectomy  1974  . Tubal ligation    . Total vaginal hysterectomy  05/2004    adenomyosis, prolapse--ovaries remain  . Bunionectomy      w/hammer toe repair    Current Outpatient Prescriptions  Medication Sig Dispense Refill  . acetaminophen (TYLENOL) 500 MG tablet Take 500 mg by mouth at bedtime as needed. Take 1000 mg daily    . ALPRAZolam (XANAX) 0.5 MG tablet Take 0.5 mg by mouth at bedtime as needed for anxiety.    Marland Kitchen aspirin EC 81 MG tablet Take 81 mg by mouth daily.    . Cholecalciferol (VITAMIN D-3) 5000 UNITS TABS Take 1 tablet by  mouth daily.    . Coenzyme Q10 (CO Q 10) 100 MG CAPS Take 1 capsule by mouth daily.    Marland Kitchen desvenlafaxine (PRISTIQ) 100 MG 24 hr tablet Take 100 mg by mouth daily.    Marland Kitchen ibuprofen (ADVIL,MOTRIN) 200 MG tablet Take 400 mg by mouth every 8 (eight) hours as needed (400 mg to 800 mg 2 to 3 times daily).    Marland Kitchen losartan (COZAAR) 50 MG tablet Take 50 mg by mouth daily.    . magnesium gluconate (MAGONATE) 30 MG tablet Take 30 mg by mouth. 3 tablets daily    . metoprolol succinate (TOPROL-XL) 100 MG 24 hr tablet Take 100 mg by mouth daily. Take with or immediately following a meal.    . Omega-3 Fatty Acids (FISH OIL PO) Take 700 mg by mouth daily.    Marland Kitchen omeprazole (PRILOSEC) 20 MG capsule Take 20 mg by mouth daily.     Marland Kitchen OVER THE COUNTER MEDICATION daily. Reported on 08/22/2015    . tizanidine (ZANAFLEX) 2 MG capsule Take 2 mg by mouth every 4 (four) hours as needed for muscle spasms.     No current facility-administered medications for this visit.     ALLERGIES: Percocet  Family History  Problem Relation Age of Onset  . Heart failure Mother     congestive  . Diabetes Mother   . Hypertension Mother   . Heart disease Father     MI  . Diabetes Father   . Heart attack Father   . Cancer Brother 58    lung cancer  . Cancer Brother     metastasized, unknown origin  . Colon cancer Paternal Grandmother   . Diabetes Brother     Social History   Social History  . Marital Status: Significant Other    Spouse Name: N/A  . Number of Children: 2  . Years of Education: N/A   Occupational History  .      Intake coordinator   Social History Main Topics  . Smoking status: Current Every Day Smoker -- 0.50 packs/day for 35 years  . Smokeless tobacco: Never Used  . Alcohol Use: No  . Drug Use: No  . Sexual Activity: No     Comment: Hyst   Other Topics Concern  . Not on file   Social History Narrative    ROS:  Pertinent items are noted in HPI.  PHYSICAL EXAMINATION:    BP 142/80 mmHg  Pulse 72  Resp 16  Ht 5\' 3"  (1.6 m)  LMP 03/07/2004 (Approximate)    General appearance: alert, cooperative and appears stated age Head: Normocephalic, without obvious abnormality, atraumatic Neck: no adenopathy, supple, symmetrical, trachea midline and thyroid normal to inspection and palpation Lungs: clear to auscultation bilaterally Heart: regular rate and rhythm Abdomen: soft, non-tender; bowel sounds normal; no masses,  no organomegaly Extremities: extremities normal, atraumatic, no cyanosis or edema Skin: Skin color, texture, turgor normal. No rashes or lesions Lymph nodes: Cervical, supraclavicular, nodes nomal. No abnormal inguinal nodes palpated Neurologic: Grossly normal  Pelvic: External  genitalia:  no lesions              Urethra:  normal appearing urethra with no masses, tenderness or lesions              Bartholins and Skenes: normal                 Vagina: normal appearing vagina with normal color and discharge, no lesions  Cervix: absent              Pap taken: No. Bimanual Exam:  Uterus:  uterus absent              Adnexa: normal adnexa and no mass, fullness, tenderness              Rectovaginal: Yes.  .  Confirms.              Anus:  normal sphincter tone, no lesions  Chaperone was present for exam.  ASSESSMENT  Status post TVH for adenomyosis.  Ovaries remain.  Mixed incontinence.  Cystocele. Rectocele. Monoclonal gammopathy of undetermined significance. Chronic neck pain and sciatica. HTN.  PLAN  Proceed with anterior and posterior colporrhaphy, TVT Exact midurethral sling and cystoscopy.  Risks, benefits, and alternative have been discussed with the patient who wishes to proceed. Surgical expectations, hospital course, and recover discussed with the patient.   An After Visit Summary was printed and given to the patient.  ___25___ minutes face to face time of which over 50% was spent in counseling.

## 2015-10-05 NOTE — Patient Instructions (Addendum)
Your procedure is scheduled on:  Tuesday, October 16, 2015  Enter through the Main Entrance of [facility] at:  8:45 AM  Pick up the phone at the desk and dial [phone].  Call this number if you have problems the morning of surgery: [phone].  Remember:  Do NOT eat food or drink after:  Midnight Monday  Take these medicines the morning of surgery with a SIP OF WATER:  Pristiq, Metoprolol, Omeprazole, Xanax if needed  Stop taking fish oil at this time.  Do NOT smoke the day of surgery.  Do NOT wear jewelry (body piercing), metal hair clips/bobby pins, make-up, or nail polish. Do NOT wear lotions, powders, or perfumes.  You may wear deodorant. Do NOT shave for 48 hours prior to surgery. Do NOT bring valuables to the hospital. Contacts, dentures, or bridgework may not be worn into surgery.  Leave suitcase in car.  After surgery it may be brought to your room.  For patients admitted to the hospital, checkout time is 11:00 AM the day of discharge.

## 2015-10-08 ENCOUNTER — Encounter (HOSPITAL_COMMUNITY): Payer: Self-pay

## 2015-10-08 ENCOUNTER — Other Ambulatory Visit: Payer: Self-pay

## 2015-10-08 ENCOUNTER — Encounter (HOSPITAL_COMMUNITY)
Admission: RE | Admit: 2015-10-08 | Discharge: 2015-10-08 | Disposition: A | Discharge status: Home / Self Care | Payer: Medicare Other | Source: Ambulatory Visit | Attending: Obstetrics and Gynecology | Admitting: Obstetrics and Gynecology

## 2015-10-08 DIAGNOSIS — Z01812 Encounter for preprocedural laboratory examination: Secondary | ICD-10-CM | POA: Insufficient documentation

## 2015-10-08 DIAGNOSIS — Z01810 Encounter for preprocedural cardiovascular examination: Secondary | ICD-10-CM | POA: Insufficient documentation

## 2015-10-08 HISTORY — DX: Adverse effect of unspecified anesthetic, initial encounter: T41.45XA

## 2015-10-08 HISTORY — DX: Other specified postprocedural states: Z98.890

## 2015-10-08 HISTORY — DX: Other complications of anesthesia, initial encounter: T88.59XA

## 2015-10-08 HISTORY — DX: Nausea with vomiting, unspecified: R11.2

## 2015-10-08 LAB — COMPREHENSIVE METABOLIC PANEL
ALT: 21 U/L (ref 14–54)
AST: 26 U/L (ref 15–41)
Albumin: 4.7 g/dL (ref 3.5–5.0)
Alkaline Phosphatase: 72 U/L (ref 38–126)
Anion gap: 9 (ref 5–15)
BUN: 13 mg/dL (ref 6–20)
CHLORIDE: 100 mmol/L — AB (ref 101–111)
CO2: 26 mmol/L (ref 22–32)
Calcium: 9.6 mg/dL (ref 8.9–10.3)
Creatinine, Ser: 0.71 mg/dL (ref 0.44–1.00)
Glucose, Bld: 115 mg/dL — ABNORMAL HIGH (ref 65–99)
POTASSIUM: 4 mmol/L (ref 3.5–5.1)
SODIUM: 135 mmol/L (ref 135–145)
Total Bilirubin: 0.4 mg/dL (ref 0.3–1.2)
Total Protein: 7.9 g/dL (ref 6.5–8.1)

## 2015-10-08 LAB — CBC
HCT: 39.9 % (ref 36.0–46.0)
Hemoglobin: 13.2 g/dL (ref 12.0–15.0)
MCH: 30.1 pg (ref 26.0–34.0)
MCHC: 33.1 g/dL (ref 30.0–36.0)
MCV: 90.9 fL (ref 78.0–100.0)
PLATELETS: 462 10*3/uL — AB (ref 150–400)
RBC: 4.39 MIL/uL (ref 3.87–5.11)
RDW: 12.8 % (ref 11.5–15.5)
WBC: 10.5 10*3/uL (ref 4.0–10.5)

## 2015-10-08 NOTE — Progress Notes (Signed)
   10/08/15 1117  OBSTRUCTIVE SLEEP APNEA  Have you ever been diagnosed with sleep apnea through a sleep study? No  Do you snore loudly (loud enough to be heard through closed doors)?  1  Do you often feel tired, fatigued, or sleepy during the daytime (such as falling asleep during driving or talking to someone)? 1  Has anyone observed you stop breathing during your sleep? 1  Do you have, or are you being treated for high blood pressure? 1  BMI more than 35 kg/m2? 1  Age > 50 (1-yes) 1  Neck circumference greater than:Female 16 inches or larger, Female 17inches or larger? 1  Female Gender (Yes=1) 0  Obstructive Sleep Apnea Score 7

## 2015-10-09 ENCOUNTER — Telehealth: Payer: Self-pay

## 2015-10-09 NOTE — Telephone Encounter (Signed)
Spoke with patient states that she was not fasting at the time of her lab work yesterday. Advised her blood glucose level was slightly elevated, but this is not of concern since she was not fasting. She is agreeable and verbalizes understanding.  Routing to provider for final review. Patient agreeable to disposition. Will close encounter.

## 2015-10-09 NOTE — Telephone Encounter (Signed)
-----   Message from Salvadore Dom, MD sent at 10/09/2015 12:30 PM EDT ----- Please check with the patient if she was fasting or not at the time of this lab draw. If she was she should get a HgbA1C, if not, she doesn't need to do anything.

## 2015-10-15 MED ORDER — DEXTROSE 5 % IV SOLN
2.0000 g | INTRAVENOUS | Status: AC
  Administered 2015-10-16: 2 g via INTRAVENOUS
  Filled 2015-10-15: qty 2

## 2015-10-15 NOTE — H&P (Signed)
Patient ID: [name], female DOB: 09-08-1955, 60 y.o. MRN: [id number] GYNECOLOGY VISIT  HPI: 60 y.o. Significant Other Caucasian female  [id number] with Patient's last menstrual period was 03/07/2004 (approximate).  here for surgical consult.   Partner, Gerald Stabs, is present for entire visit also.  Has some pressure and discomfort due to prolapse.  Difficult to maintain pessary with the urodynamic testing.   Always has had urinary incontinence, can be associated with urgency and frequency.  Some leak with laugh or cough.  Almost third degree cystocele and second degree rectocele. Apex well supported on examination. Status post TVH for prolapse and adenomyosis in 2004.   Multichannel urodynamic testing done 08/22/15 with reduction of prolapse with a pessary. Uroflow: Void 238 cc. PVR 210 cc. Continuous flow.  CMG: S1 17 cc, S2 488 cc, S3 985 cc. VLPP 75 cm H2O at 995 cc. Evidence of detrusor contraction but not with leak. Looks like a lot of artifact from movement.  UPP: 17 cm H2O. Pressure flow: Void 1128 cc. Pdet Max 10 cm H2O.   Has chronic pain in neck and has sciatica.  Uses a TENS unit.  Percocet causes headaches.  Vicodin causes sleepiness and knocks her out  Tramadol causes sleepiness.   GYNECOLOGIC HISTORY: Patient's last menstrual period was 03/07/2004 (approximate). Contraception:Hysterectomy Menopausal hormone therapy: none Last mammogram: 07-26-15 3D/Density Cat.B/Neg/BiRads1:[facility] Last pap smear: 2005 normal per patient   OB History    Gravida Para Term Preterm AB TAB SAB Ectopic Multiple Living   3 2 2  0 1 0 1 0 0 2       Patient Active Problem List   Diagnosis Date Noted  . MGUS (monoclonal gammopathy of unknown significance) 03/15/2015  . Tobacco abuse 03/14/2014  . Monoclonal (M) protein disease, multiple 'M' protein   . Osteoarthritis   . Mitral valve  prolapse   . HTN (hypertension)   . Hyperlipidemia     Past Medical History  Diagnosis Date  . Hyperlipidemia   . HTN (hypertension)   . Mitral valve prolapse   . GERD (gastroesophageal reflux disease)   . Depression   . Osteoarthritis   . Monoclonal (M) protein disease, multiple 'M' protein   . Cervical disc disease   . Migraine headache   . MGUS (monoclonal gammopathy of unknown significance) 03/15/2015    Past Surgical History  Procedure Laterality Date  . Knee arthroscopy      right knee x 2  . Meniscus repair      right knee x 1  . Cervical disc surgery  6/06  . Tonsilectomy, adenoidectomy, bilateral myringotomy and tubes    . Breast biopsy      negative x 2   . Appendectomy  1974  . Tubal ligation    . Total vaginal hysterectomy  05/2004    adenomyosis, prolapse--ovaries remain  . Bunionectomy      w/hammer toe repair    Current Outpatient Prescriptions  Medication Sig Dispense Refill  . acetaminophen (TYLENOL) 500 MG tablet Take 500 mg by mouth at bedtime as needed. Take 1000 mg daily    . ALPRAZolam (XANAX) 0.5 MG tablet Take 0.5 mg by mouth at bedtime as needed for anxiety.    Marland Kitchen aspirin EC 81 MG tablet Take 81 mg by mouth daily.    . Cholecalciferol (VITAMIN D-3) 5000 UNITS TABS Take 1 tablet by mouth daily.    . Coenzyme Q10 (CO Q 10) 100 MG CAPS Take 1 capsule by mouth daily.    Marland Kitchen  desvenlafaxine (PRISTIQ) 100 MG 24 hr tablet Take 100 mg by mouth daily.    Marland Kitchen ibuprofen (ADVIL,MOTRIN) 200 MG tablet Take 400 mg by mouth every 8 (eight) hours as needed (400 mg to 800 mg 2 to 3 times daily).    Marland Kitchen losartan (COZAAR) 50 MG tablet Take 50 mg by mouth daily.    . magnesium gluconate (MAGONATE) 30 MG tablet Take 30 mg by mouth. 3 tablets daily    . metoprolol succinate (TOPROL-XL) 100 MG 24 hr tablet Take 100 mg by mouth  daily. Take with or immediately following a meal.    . Omega-3 Fatty Acids (FISH OIL PO) Take 700 mg by mouth daily.    Marland Kitchen omeprazole (PRILOSEC) 20 MG capsule Take 20 mg by mouth daily.    Marland Kitchen OVER THE COUNTER MEDICATION daily. Reported on 08/22/2015    . tizanidine (ZANAFLEX) 2 MG capsule Take 2 mg by mouth every 4 (four) hours as needed for muscle spasms.     No current facility-administered medications for this visit.     ALLERGIES: Percocet  Family History  Problem Relation Age of Onset  . Heart failure Mother     congestive  . Diabetes Mother   . Hypertension Mother   . Heart disease Father     MI  . Diabetes Father   . Heart attack Father   . Cancer Brother 21    lung cancer  . Cancer Brother     metastasized, unknown origin  . Colon cancer Paternal Grandmother   . Diabetes Brother     Social History   Social History  . Marital Status: Significant Other    Spouse Name: N/A  . Number of Children: 2  . Years of Education: N/A   Occupational History  .      Intake coordinator   Social History Main Topics  . Smoking status: Current Every Day Smoker -- 0.50 packs/day for 35 years  . Smokeless tobacco: Never Used  . Alcohol Use: No  . Drug Use: No  . Sexual Activity: No     Comment: Hyst   Other Topics Concern  . Not on file   Social History Narrative    ROS: Pertinent items are noted in HPI.  PHYSICAL EXAMINATION:   BP 142/80 mmHg  Pulse 72  Resp 16  Ht 5\' 3"  (1.6 m)  LMP 03/07/2004 (Approximate)   General appearance: alert, cooperative and appears stated age Head: Normocephalic, without obvious abnormality, atraumatic Neck: no adenopathy, supple, symmetrical, trachea midline and thyroid normal to inspection and palpation Lungs: clear to auscultation bilaterally Heart: regular rate and rhythm Abdomen: soft,  non-tender; bowel sounds normal; no masses, no organomegaly Extremities: extremities normal, atraumatic, no cyanosis or edema Skin: Skin color, texture, turgor normal. No rashes or lesions Lymph nodes: Cervical, supraclavicular, nodes nomal. No abnormal inguinal nodes palpated Neurologic: Grossly normal  Pelvic: External genitalia: no lesions  Urethra: normal appearing urethra with no masses, tenderness or lesions  Bartholins and Skenes: normal   Vagina: normal appearing vagina with normal color and discharge, no lesions  Cervix: absent  Pap taken: No. Bimanual Exam: Uterus: uterus absent  Adnexa: normal adnexa and no mass, fullness, tenderness  Rectovaginal: Yes. . Confirms.  Anus: normal sphincter tone, no lesions  Chaperone was present for exam.  ASSESSMENT  Status post TVH for adenomyosis. Ovaries remain.  Mixed incontinence.  Cystocele. Rectocele. Monoclonal gammopathy of undetermined significance. Chronic neck pain and sciatica. HTN.  PLAN  Proceed with anterior  and posterior colporrhaphy, TVT Exact midurethral sling and cystoscopy. Risks, benefits, and alternative have been discussed with the patient who wishes to proceed. Surgical expectations, hospital course, and recover discussed with the patient.   An After Visit Summary was printed and given to the patient.  ___25___ minutes face to face time of which over 50% was spent in counseling.

## 2015-10-16 ENCOUNTER — Ambulatory Visit (HOSPITAL_COMMUNITY): Payer: Medicare Other | Admitting: Anesthesiology

## 2015-10-16 ENCOUNTER — Encounter (HOSPITAL_COMMUNITY)
Admission: RE | Disposition: A | Discharge status: Home / Self Care | Payer: Self-pay | Source: Ambulatory Visit | Attending: Obstetrics and Gynecology

## 2015-10-16 ENCOUNTER — Observation Stay (HOSPITAL_COMMUNITY)
Admission: RE | Admit: 2015-10-16 | Discharge: 2015-10-17 | Disposition: A | Discharge status: Home / Self Care | Payer: Medicare Other | Source: Ambulatory Visit | Attending: Obstetrics and Gynecology | Admitting: Obstetrics and Gynecology

## 2015-10-16 ENCOUNTER — Encounter (HOSPITAL_COMMUNITY): Payer: Self-pay | Admitting: Anesthesiology

## 2015-10-16 DIAGNOSIS — I10 Essential (primary) hypertension: Secondary | ICD-10-CM | POA: Insufficient documentation

## 2015-10-16 DIAGNOSIS — Z9889 Other specified postprocedural states: Secondary | ICD-10-CM

## 2015-10-16 DIAGNOSIS — Z90710 Acquired absence of both cervix and uterus: Secondary | ICD-10-CM | POA: Insufficient documentation

## 2015-10-16 DIAGNOSIS — N816 Rectocele: Secondary | ICD-10-CM | POA: Diagnosis not present

## 2015-10-16 DIAGNOSIS — I341 Nonrheumatic mitral (valve) prolapse: Secondary | ICD-10-CM | POA: Diagnosis not present

## 2015-10-16 DIAGNOSIS — Z79899 Other long term (current) drug therapy: Secondary | ICD-10-CM | POA: Insufficient documentation

## 2015-10-16 DIAGNOSIS — E785 Hyperlipidemia, unspecified: Secondary | ICD-10-CM | POA: Insufficient documentation

## 2015-10-16 DIAGNOSIS — F17200 Nicotine dependence, unspecified, uncomplicated: Secondary | ICD-10-CM | POA: Insufficient documentation

## 2015-10-16 DIAGNOSIS — Z7982 Long term (current) use of aspirin: Secondary | ICD-10-CM | POA: Insufficient documentation

## 2015-10-16 DIAGNOSIS — M1990 Unspecified osteoarthritis, unspecified site: Secondary | ICD-10-CM | POA: Insufficient documentation

## 2015-10-16 DIAGNOSIS — Z9851 Tubal ligation status: Secondary | ICD-10-CM | POA: Diagnosis not present

## 2015-10-16 DIAGNOSIS — N8110 Cystocele, unspecified: Secondary | ICD-10-CM | POA: Diagnosis present

## 2015-10-16 DIAGNOSIS — F329 Major depressive disorder, single episode, unspecified: Secondary | ICD-10-CM | POA: Insufficient documentation

## 2015-10-16 DIAGNOSIS — Z885 Allergy status to narcotic agent status: Secondary | ICD-10-CM | POA: Insufficient documentation

## 2015-10-16 DIAGNOSIS — N393 Stress incontinence (female) (male): Secondary | ICD-10-CM | POA: Diagnosis not present

## 2015-10-16 HISTORY — PX: CYSTO: SHX6284

## 2015-10-16 HISTORY — PX: BLADDER SUSPENSION: SHX72

## 2015-10-16 HISTORY — PX: ANTERIOR AND POSTERIOR REPAIR: SHX5121

## 2015-10-16 SURGERY — ANTERIOR (CYSTOCELE) AND POSTERIOR REPAIR (RECTOCELE)
Anesthesia: General | Site: Vagina

## 2015-10-16 MED ORDER — MAGNESIUM OXIDE 400 (241.3 MG) MG PO TABS
400.0000 mg | ORAL_TABLET | Freq: Every day | ORAL | Status: DC
  Filled 2015-10-16 (×2): qty 1

## 2015-10-16 MED ORDER — IBUPROFEN 600 MG PO TABS: 600.0000 mg | ORAL_TABLET | Freq: Four times a day (QID) | ORAL | Status: DC | PRN

## 2015-10-16 MED ORDER — DIPHENHYDRAMINE HCL 12.5 MG/5ML PO ELIX: 12.5000 mg | ORAL_SOLUTION | Freq: Four times a day (QID) | ORAL | Status: DC | PRN

## 2015-10-16 MED ORDER — LIDOCAINE HCL (CARDIAC) 20 MG/ML IV SOLN
INTRAVENOUS | Status: AC
  Filled 2015-10-16: qty 5

## 2015-10-16 MED ORDER — DEXAMETHASONE SODIUM PHOSPHATE 4 MG/ML IJ SOLN
INTRAMUSCULAR | Status: AC
  Filled 2015-10-16: qty 1

## 2015-10-16 MED ORDER — PHENYLEPHRINE HCL 10 MG/ML IJ SOLN
INTRAMUSCULAR | Status: DC | PRN
  Administered 2015-10-16 (×5): 40 ug via INTRAVENOUS

## 2015-10-16 MED ORDER — PROPOFOL 10 MG/ML IV BOLUS
INTRAVENOUS | Status: AC
  Filled 2015-10-16: qty 20

## 2015-10-16 MED ORDER — FENTANYL CITRATE (PF) 100 MCG/2ML IJ SOLN
INTRAMUSCULAR | Status: AC
Start: 2015-10-16 — End: 2015-10-16
  Administered 2015-10-16: 50 ug via INTRAVENOUS
  Filled 2015-10-16: qty 2

## 2015-10-16 MED ORDER — ACETAMINOPHEN 500 MG PO TABS: 1000.0000 mg | ORAL_TABLET | Freq: Every day | ORAL | Status: DC | PRN

## 2015-10-16 MED ORDER — VITAMIN D 1000 UNITS PO TABS
5000.0000 [IU] | ORAL_TABLET | Freq: Every day | ORAL | Status: DC
  Filled 2015-10-16 (×3): qty 5

## 2015-10-16 MED ORDER — LIDOCAINE-EPINEPHRINE 1 %-1:100000 IJ SOLN
INTRAMUSCULAR | Status: DC | PRN
  Administered 2015-10-16: 1 mL
  Administered 2015-10-16: 5 mL
  Administered 2015-10-16: 2 mL

## 2015-10-16 MED ORDER — FENTANYL CITRATE (PF) 100 MCG/2ML IJ SOLN
INTRAMUSCULAR | Status: DC | PRN
  Administered 2015-10-16: 100 ug via INTRAVENOUS
  Administered 2015-10-16: 25 ug via INTRAVENOUS
  Administered 2015-10-16: 50 ug via INTRAVENOUS
  Administered 2015-10-16: 25 ug via INTRAVENOUS
  Administered 2015-10-16: 50 ug via INTRAVENOUS

## 2015-10-16 MED ORDER — SUGAMMADEX SODIUM 200 MG/2ML IV SOLN
INTRAVENOUS | Status: DC | PRN
  Administered 2015-10-16: 152.8 mg via INTRAVENOUS

## 2015-10-16 MED ORDER — FENTANYL CITRATE (PF) 100 MCG/2ML IJ SOLN
INTRAMUSCULAR | Status: AC
  Administered 2015-10-16: 50 ug via INTRAVENOUS
  Filled 2015-10-16: qty 2

## 2015-10-16 MED ORDER — DEXAMETHASONE SODIUM PHOSPHATE 10 MG/ML IJ SOLN
INTRAMUSCULAR | Status: DC | PRN
  Administered 2015-10-16: 4 mg via INTRAVENOUS

## 2015-10-16 MED ORDER — LACTATED RINGERS IV SOLN
INTRAVENOUS | Status: DC
  Administered 2015-10-16 – 2015-10-17 (×2): via INTRAVENOUS

## 2015-10-16 MED ORDER — LACTATED RINGERS IV SOLN
INTRAVENOUS | Status: DC
  Administered 2015-10-16 (×3): via INTRAVENOUS

## 2015-10-16 MED ORDER — ESTRADIOL 0.1 MG/GM VA CREA
TOPICAL_CREAM | VAGINAL | Status: DC | PRN
  Administered 2015-10-16: 1 via VAGINAL

## 2015-10-16 MED ORDER — PHENYLEPHRINE 40 MCG/ML (10ML) SYRINGE FOR IV PUSH (FOR BLOOD PRESSURE SUPPORT)
PREFILLED_SYRINGE | INTRAVENOUS | Status: AC
  Filled 2015-10-16: qty 10

## 2015-10-16 MED ORDER — PROPOFOL 10 MG/ML IV BOLUS
INTRAVENOUS | Status: DC | PRN
  Administered 2015-10-16: 150 mg via INTRAVENOUS
  Administered 2015-10-16: 30 mg via INTRAVENOUS
  Administered 2015-10-16: 20 mg via INTRAVENOUS

## 2015-10-16 MED ORDER — MENTHOL 3 MG MT LOZG: 1.0000 | LOZENGE | OROMUCOSAL | Status: DC | PRN

## 2015-10-16 MED ORDER — FENOFIBRATE 160 MG PO TABS
160.0000 mg | ORAL_TABLET | Freq: Every day | ORAL | Status: DC
  Administered 2015-10-16: 160 mg via ORAL
  Filled 2015-10-16 (×2): qty 1

## 2015-10-16 MED ORDER — GLYCOPYRROLATE 0.2 MG/ML IJ SOLN
INTRAMUSCULAR | Status: AC
  Filled 2015-10-16: qty 1

## 2015-10-16 MED ORDER — ONDANSETRON HCL 4 MG PO TABS: 4.0000 mg | ORAL_TABLET | Freq: Four times a day (QID) | ORAL | Status: DC | PRN

## 2015-10-16 MED ORDER — FENTANYL CITRATE (PF) 100 MCG/2ML IJ SOLN
25.0000 ug | INTRAMUSCULAR | Status: DC | PRN
  Administered 2015-10-16 (×4): 50 ug via INTRAVENOUS

## 2015-10-16 MED ORDER — MORPHINE SULFATE 2 MG/ML IV SOLN
INTRAVENOUS | Status: DC
  Administered 2015-10-16: 17:00:00 via INTRAVENOUS
  Administered 2015-10-16: 6 mg via INTRAVENOUS
  Administered 2015-10-17: 1 mg via INTRAVENOUS
  Administered 2015-10-17: 2 mg via INTRAVENOUS
  Administered 2015-10-17: 1 mg via INTRAVENOUS
  Filled 2015-10-16: qty 25

## 2015-10-16 MED ORDER — LACTATED RINGERS IV SOLN
INTRAVENOUS | Status: DC
  Administered 2015-10-16: 09:00:00 via INTRAVENOUS

## 2015-10-16 MED ORDER — SCOPOLAMINE 1 MG/3DAYS TD PT72
MEDICATED_PATCH | TRANSDERMAL | Status: AC
  Administered 2015-10-16: 1.5 mg
  Filled 2015-10-16: qty 1

## 2015-10-16 MED ORDER — LOSARTAN POTASSIUM 50 MG PO TABS
50.0000 mg | ORAL_TABLET | Freq: Every day | ORAL | Status: DC
  Administered 2015-10-16: 50 mg via ORAL
  Filled 2015-10-16 (×3): qty 1

## 2015-10-16 MED ORDER — ONDANSETRON HCL 4 MG/2ML IJ SOLN: 4.0000 mg | Freq: Once | INTRAMUSCULAR | Status: DC | PRN

## 2015-10-16 MED ORDER — TIZANIDINE HCL 2 MG PO TABS
2.0000 mg | ORAL_TABLET | ORAL | Status: DC | PRN
  Filled 2015-10-16: qty 1

## 2015-10-16 MED ORDER — ROCURONIUM BROMIDE 100 MG/10ML IV SOLN
INTRAVENOUS | Status: DC | PRN
  Administered 2015-10-16: 35 mg via INTRAVENOUS
  Administered 2015-10-16: 20 mg via INTRAVENOUS
  Administered 2015-10-16: 15 mg via INTRAVENOUS

## 2015-10-16 MED ORDER — LIDOCAINE HCL (CARDIAC) 20 MG/ML IV SOLN
INTRAVENOUS | Status: DC | PRN
  Administered 2015-10-16: 30 mg via INTRAVENOUS
  Administered 2015-10-16: 70 mg via INTRAVENOUS

## 2015-10-16 MED ORDER — TRAMADOL HCL 50 MG PO TABS
50.0000 mg | ORAL_TABLET | Freq: Four times a day (QID) | ORAL | Status: DC | PRN
  Administered 2015-10-17: 50 mg via ORAL
  Filled 2015-10-16: qty 1

## 2015-10-16 MED ORDER — LIDOCAINE-EPINEPHRINE 1 %-1:100000 IJ SOLN
INTRAMUSCULAR | Status: AC
  Filled 2015-10-16: qty 1

## 2015-10-16 MED ORDER — ONDANSETRON HCL 4 MG/2ML IJ SOLN
INTRAMUSCULAR | Status: DC | PRN
  Administered 2015-10-16: 4 mg via INTRAVENOUS

## 2015-10-16 MED ORDER — KETOROLAC TROMETHAMINE 15 MG/ML IJ SOLN
15.0000 mg | Freq: Four times a day (QID) | INTRAMUSCULAR | Status: DC
  Administered 2015-10-16 – 2015-10-17 (×3): 15 mg via INTRAVENOUS
  Filled 2015-10-16 (×5): qty 1

## 2015-10-16 MED ORDER — STERILE WATER FOR IRRIGATION IR SOLN
Status: DC | PRN
Start: 2015-10-16 — End: 2015-10-16
  Administered 2015-10-16: 1000 mL via INTRAVESICAL

## 2015-10-16 MED ORDER — NALOXONE HCL 0.4 MG/ML IJ SOLN: 0.4000 mg | INTRAMUSCULAR | Status: DC | PRN

## 2015-10-16 MED ORDER — MIDAZOLAM HCL 2 MG/2ML IJ SOLN
INTRAMUSCULAR | Status: AC
  Filled 2015-10-16: qty 2

## 2015-10-16 MED ORDER — ONDANSETRON HCL 4 MG/2ML IJ SOLN
4.0000 mg | Freq: Four times a day (QID) | INTRAMUSCULAR | Status: DC | PRN
  Administered 2015-10-17: 4 mg via INTRAVENOUS
  Filled 2015-10-16: qty 2

## 2015-10-16 MED ORDER — DIPHENHYDRAMINE HCL 50 MG/ML IJ SOLN: 12.5000 mg | Freq: Four times a day (QID) | INTRAMUSCULAR | Status: DC | PRN

## 2015-10-16 MED ORDER — KETOROLAC TROMETHAMINE 30 MG/ML IJ SOLN
INTRAMUSCULAR | Status: AC
  Filled 2015-10-16: qty 1

## 2015-10-16 MED ORDER — MIDAZOLAM HCL 2 MG/2ML IJ SOLN
INTRAMUSCULAR | Status: DC | PRN
  Administered 2015-10-16: 1 mg via INTRAVENOUS

## 2015-10-16 MED ORDER — SUGAMMADEX SODIUM 200 MG/2ML IV SOLN
INTRAVENOUS | Status: AC
  Filled 2015-10-16: qty 2

## 2015-10-16 MED ORDER — METOPROLOL SUCCINATE ER 100 MG PO TB24
100.0000 mg | ORAL_TABLET | Freq: Every day | ORAL | Status: DC
  Filled 2015-10-16 (×2): qty 1

## 2015-10-16 MED ORDER — KETOROLAC TROMETHAMINE 30 MG/ML IJ SOLN
INTRAMUSCULAR | Status: DC | PRN
  Administered 2015-10-16: 30 mg via INTRAVENOUS

## 2015-10-16 MED ORDER — SODIUM CHLORIDE 0.9% FLUSH: 9.0000 mL | INTRAVENOUS | Status: DC | PRN

## 2015-10-16 MED ORDER — FENTANYL CITRATE (PF) 250 MCG/5ML IJ SOLN
INTRAMUSCULAR | Status: AC
  Filled 2015-10-16: qty 5

## 2015-10-16 MED ORDER — GLYCOPYRROLATE 0.2 MG/ML IJ SOLN
INTRAMUSCULAR | Status: DC | PRN
  Administered 2015-10-16 (×2): 0.2 mg via INTRAVENOUS

## 2015-10-16 MED ORDER — ESTRADIOL 0.1 MG/GM VA CREA
TOPICAL_CREAM | VAGINAL | Status: AC
  Filled 2015-10-16: qty 42.5

## 2015-10-16 MED ORDER — CO Q 10 100 MG PO CAPS: 1.0000 | ORAL_CAPSULE | Freq: Every day | ORAL | Status: DC

## 2015-10-16 MED ORDER — ONDANSETRON HCL 4 MG/2ML IJ SOLN
INTRAMUSCULAR | Status: AC
  Filled 2015-10-16: qty 2

## 2015-10-16 MED ORDER — PANTOPRAZOLE SODIUM 40 MG PO TBEC
40.0000 mg | DELAYED_RELEASE_TABLET | Freq: Every day | ORAL | Status: DC
  Administered 2015-10-17: 40 mg via ORAL
  Filled 2015-10-16: qty 1

## 2015-10-16 MED ORDER — ALPRAZOLAM 0.25 MG PO TABS: 0.2500 mg | ORAL_TABLET | Freq: Two times a day (BID) | ORAL | Status: DC | PRN

## 2015-10-16 SURGICAL SUPPLY — 44 items
BLADE SURG 11 STRL SS (BLADE) ×4 IMPLANT
CANISTER SUCT 3000ML (MISCELLANEOUS) ×4 IMPLANT
CATH FOLEY 2WAY SLVR  5CC 18FR (CATHETERS) ×2
CATH FOLEY 2WAY SLVR 5CC 18FR (CATHETERS) ×2 IMPLANT
CLOTH BEACON ORANGE TIMEOUT ST (SAFETY) ×4 IMPLANT
CONT PATH 16OZ SNAP LID 3702 (MISCELLANEOUS) IMPLANT
DECANTER SPIKE VIAL GLASS SM (MISCELLANEOUS) ×2 IMPLANT
DEVICE CAPIO SLIM SINGLE (INSTRUMENTS) IMPLANT
GAUZE PACKING 2X5 YD STRL (GAUZE/BANDAGES/DRESSINGS) ×4 IMPLANT
GAUZE SPONGE 4X4 16PLY XRAY LF (GAUZE/BANDAGES/DRESSINGS) ×2 IMPLANT
GLOVE BIO SURGEON STRL SZ 6.5 (GLOVE) ×3 IMPLANT
GLOVE BIO SURGEONS STRL SZ 6.5 (GLOVE) ×1
GLOVE BIOGEL PI IND STRL 6.5 (GLOVE) ×2 IMPLANT
GLOVE BIOGEL PI IND STRL 7.0 (GLOVE) ×2 IMPLANT
GLOVE BIOGEL PI INDICATOR 6.5 (GLOVE) ×2
GLOVE BIOGEL PI INDICATOR 7.0 (GLOVE) ×2
GOWN STRL REUS W/TWL LRG LVL3 (GOWN DISPOSABLE) ×16 IMPLANT
LIQUID BAND (GAUZE/BANDAGES/DRESSINGS) ×4 IMPLANT
NDL MAYO 6 CRC TAPER PT (NEEDLE) IMPLANT
NEEDLE HYPO 22GX1.5 SAFETY (NEEDLE) ×4 IMPLANT
NEEDLE MAYO 6 CRC TAPER PT (NEEDLE) IMPLANT
NS IRRIG 1000ML POUR BTL (IV SOLUTION) ×4 IMPLANT
PACK VAGINAL WOMENS (CUSTOM PROCEDURE TRAY) ×4 IMPLANT
PAD MAGNETIC INST (MISCELLANEOUS) ×4 IMPLANT
PLUG CATH AND CAP STER (CATHETERS) ×4 IMPLANT
RETRACTOR STAY HOOK 5MM (MISCELLANEOUS) ×2 IMPLANT
SET CYSTO W/LG BORE CLAMP LF (SET/KITS/TRAYS/PACK) ×4 IMPLANT
SLING TVT EXACT (Sling) ×2 IMPLANT
SPONGE SURGIFOAM ABS GEL 12-7 (HEMOSTASIS) IMPLANT
SURGIFLO TRUKIT (HEMOSTASIS) ×2 IMPLANT
SUT CAPIO ETHIBPND (SUTURE) IMPLANT
SUT VIC AB 0 CT1 27 (SUTURE) ×8
SUT VIC AB 0 CT1 27XBRD ANBCTR (SUTURE) ×6 IMPLANT
SUT VIC AB 2-0 CT1 27 (SUTURE)
SUT VIC AB 2-0 CT1 TAPERPNT 27 (SUTURE) IMPLANT
SUT VIC AB 2-0 CT2 27 (SUTURE) IMPLANT
SUT VIC AB 2-0 SH 27 (SUTURE) ×16
SUT VIC AB 2-0 SH 27XBRD (SUTURE) ×8 IMPLANT
SUT VIC AB 2-0 UR6 27 (SUTURE) IMPLANT
TOWEL OR 17X24 6PK STRL BLUE (TOWEL DISPOSABLE) ×8 IMPLANT
TRAY FOLEY BAG SILVER LF 16FR (SET/KITS/TRAYS/PACK) ×4 IMPLANT
TUBING NON-CON 1/4 X 20 CONN (TUBING) ×2 IMPLANT
TUBING NON-CON 1/4 X 20' CONN (TUBING)
WATER STERILE IRR 1000ML POUR (IV SOLUTION) ×2 IMPLANT

## 2015-10-16 NOTE — Anesthesia Postprocedure Evaluation (Signed)
Anesthesia Post Note  Patient: NATASJA ZICKAFOOSE  Procedure(s) Performed: Procedure(s) (LRB): ANTERIOR (CYSTOCELE) AND POSTERIOR REPAIR (RECTOCELE) (N/A) TRANSVAGINAL TAPE (TVT) PROCEDURE exact midurethral sling (N/A) CYSTO (N/A)  Patient location during evaluation: [facility] Anesthesia Type: General Level of consciousness: awake and alert Pain management: pain level controlled Vital Signs Assessment: post-procedure vital signs reviewed and stable Respiratory status: spontaneous breathing Cardiovascular status: blood pressure returned to baseline Postop Assessment: adequate PO intake and no signs of nausea or vomiting Anesthetic complications: no    Last Vitals:  Filed Vitals:   10/16/15 1615 10/16/15 1640  BP: 121/74 121/76  Pulse: 79 79  Temp: 37.3 C 36.9 C  Resp: 20 16    Last Pain:  Filed Vitals:   10/16/15 1656  PainSc: 2                  Tajai Suder, Velvet Bathe

## 2015-10-16 NOTE — Brief Op Note (Signed)
10/16/2015  1:54 PM  PATIENT:  [name]  60 y.o. female  PRE-OPERATIVE DIAGNOSIS:  cystocele, rectocele, genuine Stress Urinary Incontinence  POST-OPERATIVE DIAGNOSIS:  cystocele, rectocele, genuine Stress Urinary Incontinence  PROCEDURE:  Procedure(s): ANTERIOR (CYSTOCELE) AND POSTERIOR REPAIR (RECTOCELE) (N/A) TRANSVAGINAL TAPE (TVT) PROCEDURE exact midurethral sling (N/A) CYSTO (N/A)  SURGEON:  Surgeon(s) and Role:    * Brook E Yisroel Ramming, MD - Primary    * Salvadore Dom, MD - Assisting  PHYSICIAN ASSISTANT: NA  ASSISTANTS: Sumner Boast, MD   ANESTHESIA:   local and general  EBL:  Total I/O In: 1800 [I.V.:1800] Out: 600 [Urine:400; Blood:200]  BLOOD ADMINISTERED:none  DRAINS: Urinary Catheter (Foley)   LOCAL MEDICATIONS USED:  LIDOCAINE   SPECIMEN:  No Specimen  DISPOSITION OF SPECIMEN:  N/A  COUNTS:  YES  TOURNIQUET:  * No tourniquets in log *  DICTATION: .Other Dictation: Dictation Number    PLAN OF CARE: Admit for overnight observation  PATIENT DISPOSITION:  PACU - hemodynamically stable.   Delay start of Pharmacological VTE agent (>24hrs) due to surgical blood loss or risk of bleeding: not applicable

## 2015-10-16 NOTE — Progress Notes (Signed)
Pre op update   No marked change in status since office preop visit.  Patient examined. OK to proceed.  Will plan for Tramadol post op when taking po well.

## 2015-10-16 NOTE — Anesthesia Preprocedure Evaluation (Signed)
Anesthesia Evaluation  Patient identified by MRN, date of birth, ID band Patient awake    Reviewed: Allergy & Precautions, NPO status , Patient's Chart, lab work & pertinent test results  Airway Mallampati: II  TM Distance: >3 FB Neck ROM: Full    Dental  (+) Edentulous Upper, Edentulous Lower   Pulmonary Current Smoker,    breath sounds clear to auscultation       Cardiovascular hypertension,  Rhythm:Regular Rate:Normal     Neuro/Psych    GI/Hepatic   Endo/Other    Renal/GU      Musculoskeletal   Abdominal   Peds  Hematology   Anesthesia Other Findings   Reproductive/Obstetrics                             Anesthesia Physical Anesthesia Plan  ASA: III  Anesthesia Plan: General   Post-op Pain Management:    Induction: Intravenous  Airway Management Planned: Oral ETT  Additional Equipment:   Intra-op Plan:   Post-operative Plan: Extubation in OR  Informed Consent: I have reviewed the patients History and Physical, chart, labs and discussed the procedure including the risks, benefits and alternatives for the proposed anesthesia with the patient or authorized representative who has indicated his/her understanding and acceptance.     Plan Discussed with: CRNA and Anesthesiologist  Anesthesia Plan Comments:         Anesthesia Quick Evaluation  

## 2015-10-16 NOTE — Addendum Note (Signed)
Addendum  created 10/16/15 1725 by Genevie Ann, CRNA   Modules edited: Clinical Notes   Clinical Notes:  File: [id number]

## 2015-10-16 NOTE — Transfer of Care (Signed)
Immediate Anesthesia Transfer of Care Note  Patient: [name]  Procedure(s) Performed: Procedure(s): ANTERIOR (CYSTOCELE) AND POSTERIOR REPAIR (RECTOCELE) (N/A) TRANSVAGINAL TAPE (TVT) PROCEDURE exact midurethral sling (N/A) CYSTO (N/A)  Patient Location: PACU  Anesthesia Type:General  Level of Consciousness: awake, sedated and patient cooperative  Airway & Oxygen Therapy: Patient Spontanous Breathing and Patient connected to nasal cannula oxygen  Post-op Assessment: Report given to RN and Post -op Vital signs reviewed and stable  Post vital signs: Reviewed and stable  Last Vitals:  Filed Vitals:   10/16/15 0845  BP: 127/91  Pulse: 76  Temp: 36.6 C  Resp: 20    Complications: No apparent anesthesia complications

## 2015-10-16 NOTE — Anesthesia Postprocedure Evaluation (Signed)
Anesthesia Post Note  Patient: LINELLE HAYNIE  Procedure(s) Performed: Procedure(s) (LRB): ANTERIOR (CYSTOCELE) AND POSTERIOR REPAIR (RECTOCELE) (N/A) TRANSVAGINAL TAPE (TVT) PROCEDURE exact midurethral sling (N/A) CYSTO (N/A)  Patient location during evaluation: PACU Anesthesia Type: General Level of consciousness: awake Pain management: pain level controlled Vital Signs Assessment: post-procedure vital signs reviewed and stable Respiratory status: spontaneous breathing Cardiovascular status: stable Postop Assessment: no signs of nausea or vomiting Anesthetic complications: no    Last Vitals:  Filed Vitals:   10/16/15 1515 10/16/15 1530  BP: 119/82 110/75  Pulse: 79 85  Temp: 36.4 C   Resp: 16 22    Last Pain:  Filed Vitals:   10/16/15 1532  PainSc: [place]

## 2015-10-16 NOTE — Anesthesia Procedure Notes (Signed)
Procedure Name: Intubation Date/Time: 10/16/2015 11:20 AM Performed by: Tobin Chad Pre-anesthesia Checklist: Patient identified, Emergency Drugs available, Suction available and Patient being monitored Patient Re-evaluated:Patient Re-evaluated prior to inductionOxygen Delivery Method: Circle system utilized and Simple face mask Preoxygenation: Pre-oxygenation with 100% oxygen Intubation Type: IV induction and Inhalational induction Ventilation: Mask ventilation without difficulty Laryngoscope Size: Mac and 3 Grade View: Grade II Tube type: Oral Tube size: 7.0 mm Number of attempts: 1 Airway Equipment and Method: Stylet Placement Confirmation: ETT inserted through vocal cords under direct vision,  positive ETCO2 and breath sounds checked- equal and bilateral Secured at: 22 cm Tube secured with: Tape Dental Injury: Teeth and Oropharynx as per pre-operative assessment

## 2015-10-16 NOTE — Progress Notes (Signed)
Day of Surgery Procedure(s) (LRB): ANTERIOR (CYSTOCELE) AND POSTERIOR REPAIR (RECTOCELE) (N/A) TRANSVAGINAL TAPE (TVT) PROCEDURE exact midurethral sling (N/A) CYSTO (N/A)  Subjective: Patient reports pain is now under control.  Is in PACU and will transfer now to the [facility] Unit.   Objective: I have reviewed patient's vital signs and intake and output. T 97.5, BP 110/75, P 85, RR 22. I/O - 1800 cc IV/1200 cc output (600 cc UO)  General: sedated.  Appropriate conversation.  Resp: clear to auscultation bilaterally Cardio: regular rate and rhythm, S1, S2 normal, no murmur, click, rub or gallop GI: incision: clean, dry and intact and scant bowel sounds, soft and nontender abdomen. Extremities: PAS and Ted hose on.  DPs 2+ bilaterally.  Vaginal Bleeding: none  Assessment: s/p Procedure(s): ANTERIOR (CYSTOCELE) AND POSTERIOR REPAIR (RECTOCELE) (N/A) TRANSVAGINAL TAPE (TVT) PROCEDURE exact midurethral sling (N/A) CYSTO (N/A): stable  Plan: Clear liquids Foley cath and vaginal packin g overnight.   CBC and BMP in am.  Morphine PCA and Toradol for pain control.      [name] A [name] 10/16/2015, 4:15 PM

## 2015-10-17 ENCOUNTER — Encounter (HOSPITAL_COMMUNITY): Payer: Self-pay | Admitting: Obstetrics and Gynecology

## 2015-10-17 DIAGNOSIS — N8110 Cystocele, unspecified: Secondary | ICD-10-CM | POA: Diagnosis not present

## 2015-10-17 LAB — BASIC METABOLIC PANEL
Anion gap: 4 — ABNORMAL LOW (ref 5–15)
BUN: 8 mg/dL (ref 6–20)
CO2: 31 mmol/L (ref 22–32)
Calcium: 8.8 mg/dL — ABNORMAL LOW (ref 8.9–10.3)
Chloride: 104 mmol/L (ref 101–111)
Creatinine, Ser: 0.67 mg/dL (ref 0.44–1.00)
GFR calc Af Amer: >60 mL/min (ref >60)
Glucose, Bld: 121 mg/dL — ABNORMAL HIGH (ref 65–99)
POTASSIUM: 4.5 mmol/L (ref 3.5–5.1)
SODIUM: 139 mmol/L (ref 135–145)

## 2015-10-17 LAB — CBC
HCT: 32.9 % — ABNORMAL LOW (ref 36.0–46.0)
Hemoglobin: 10.8 g/dL — ABNORMAL LOW (ref 12.0–15.0)
MCH: 30 pg (ref 26.0–34.0)
MCHC: 32.8 g/dL (ref 30.0–36.0)
MCV: 91.4 fL (ref 78.0–100.0)
PLATELETS: 375 10*3/uL (ref 150–400)
RBC: 3.6 MIL/uL — AB (ref 3.87–5.11)
RDW: 12.9 % (ref 11.5–15.5)
WBC: 13.4 10*3/uL — AB (ref 4.0–10.5)

## 2015-10-17 MED ORDER — PROCHLORPERAZINE EDISYLATE 5 MG/ML IJ SOLN
10.0000 mg | Freq: Once | INTRAMUSCULAR | Status: AC
  Administered 2015-10-17: 10 mg via INTRAVENOUS
  Filled 2015-10-17: qty 2

## 2015-10-17 MED ORDER — TRAMADOL HCL 50 MG PO TABS: 50.0000 mg | ORAL_TABLET | Freq: Four times a day (QID) | ORAL | Status: DC | PRN

## 2015-10-17 MED ORDER — IBUPROFEN 600 MG PO TABS: 600.0000 mg | ORAL_TABLET | Freq: Four times a day (QID) | ORAL | Status: DC | PRN

## 2015-10-17 MED ORDER — CIPROFLOXACIN HCL 250 MG PO TABS: ORAL_TABLET | ORAL | Status: DC

## 2015-10-17 MED ORDER — PROCHLORPERAZINE MALEATE 10 MG PO TABS: 10.0000 mg | ORAL_TABLET | Freq: Four times a day (QID) | ORAL | Status: DC | PRN

## 2015-10-17 NOTE — Discharge Instructions (Signed)
Anterior and Posterior Colporrhaphy, Sling Procedure, Care After Refer to this sheet in the next few weeks. These instructions provide you with information on caring for yourself after your procedure. Your health care provider may also give you more specific instructions. Your treatment has been planned according to current medical practices, but problems sometimes occur. Call your health care provider if you have any problems or questions after your procedure.  HOME CARE INSTRUCTIONS  Rest as much as possible during the first 2 weeks after the procedure.   Avoid heavy lifting (more than 10 pounds [4.5 kg]), pushing, or pulling. Limit stair climbing to once or twice a day the first week, then slowly increase this activity.   Avoid standing for prolonged periods of time.   Talk with your health care provider about when you may resume your usual physical activity.   You may resume your normal diet right away.   Drink at least 6-8 glasses of non-caffeinated beverages per day.   Eat a well-balanced diet. Daily portions of food from the meat (protein), milk, fruit, vegetable, and bread families are necessary for your health.   Your normal bowel function should return. If you become constipated, you may:   Take a mild laxative.  Add fruit and bran to your diet.  Drink more liquids.  You may take a shower and wash your hair.   Only take over-the-counter or prescription medicines as directed by your health care provider.   Clean the incision with water. Do not use a dressing unless the incision is draining or irritated. Check your incision daily for redness, draining, swelling, or separation of the skin.   Follow any bladder care instructions provided by your health care provider.   Keep your perineal area (the area between vagina and rectum) clean and dry. Perform perineal care after every bowel movement and each time you urinate. You may take a sitz bath or sit in a tub of  clean, warm water when necessary, unless your health care provider tells you otherwise.   Do not have sexual intercourse until permitted by your health care provider.   Follow up with your health care provider as directed.  SEEK MEDICAL CARE IF:  You have shaking chills.   Your pain is not relieved with medicine or becomes worse.  You have frequent or urgent urination, or you are unable to completely empty your bladder.   You feel a burning sensation when urinating.   You see pus coming from the wounds.  SEEK IMMEDIATE MEDICAL CARE IF:  You develop a fever.  You notice redness, drainage, swelling, or separation of the skin at the incision site.  You have difficulty breathing.  You are unable to urinate. MAKE SURE YOU:   Understand these instructions.  Will watch your condition.  Will get help right away if you are not doing well or get worse.   This information is not intended to replace advice given to you by your health care provider. Make sure you discuss any questions you have with your health care provider.   Document Released: 02/05/2004 Document Revised: 02/23/2013 Document Reviewed: 11/12/2012 Elsevier Interactive Patient Education [employer].

## 2015-10-17 NOTE — Progress Notes (Signed)
Vaginal packing and foley removed as ordered. Vaginal packing had small and dark red drainage. Pt tolerated fairly. Will continue to monitor pt .

## 2015-10-17 NOTE — Progress Notes (Signed)
1 Day Post-Op Procedure(s) (LRB): ANTERIOR (CYSTOCELE) AND POSTERIOR REPAIR (RECTOCELE) (N/A) TRANSVAGINAL TAPE (TVT) PROCEDURE exact midurethral sling (N/A) CYSTO (N/A)  Subjective: Patient reports Foley and vaginal packing out.  No void yet.  Hungry.  Has ambulated.  Objective: I have reviewed patient's vital signs, intake and output and labs. T 98.8, BP 121/69, P 73, RR 16. I/O - 3960.5 cc/2550 cc WBC - 13.4, Hgb 10.8, glucose 121  General: alert and cooperative Resp: clear to auscultation bilaterally Cardio: regular rate and rhythm, S1, S2 normal, no murmur, click, rub or gallop GI: incision: clean, dry and intact and Scant bowel sounds, soft, and nontender.  No masses. Extremities: Ted hose on.  Vaginal Bleeding: minimal  Assessment: s/p Procedure(s): ANTERIOR (CYSTOCELE) AND POSTERIOR REPAIR (RECTOCELE) (N/A) TRANSVAGINAL TAPE (TVT) PROCEDURE exact midurethral sling (N/A) CYSTO (N/A): progressing well  Plan: Advance diet Encourage ambulation Advance to PO medication Discharge home Bladder training this am to see if needs Foley at discharge.  Surgical findings and procedure discussed. Follow up in 5 days.  Instructions and precautions reviewed.  Tramadol and Motrin for pain.    LOS: 1 day    [name] 10/17/2015, 7:42 AM

## 2015-10-17 NOTE — Op Note (Signed)
[name], [name] NO.:  [id number]  MEDICAL RECORD NO.:  [id number]  LOCATION:  [id number]                          FACILITY:  [facility]  PHYSICIAN:  Lenard Galloway, M.D.   DATE OF BIRTH:  17-Aug-1955  DATE OF PROCEDURE:  10/16/2015 DATE OF DISCHARGE:                              OPERATIVE REPORT   PREOPERATIVE DIAGNOSES:  Cystocele, rectocele, genuine stress incontinence.  POSTOPERATIVE DIAGNOSES:  Cystocele, rectocele, genuine stress incontinence.  PROCEDURES:  Anterior and posterior colporrhaphy, TVT Exact midurethral sling and cystoscopy.  SURGEON:  Lenard Galloway, M.D.  ASSISTANT:  Sumner Boast, M.D.  ANESTHESIA:  General endotracheal, local with 1% lidocaine with epinephrine, 1:100,000.  IV FLUIDS FOR THE PROCEDURE:  1800 mL Ringer's lactate.  EBL:  200 mL.  URINE OUTPUT:  400 mL.  COMPLICATIONS:  None.  INDICATIONS FOR THE PROCEDURE:  The patient is a 60 year old, gravida 3, para 2-0-1-2, Caucasian female, status post total vaginal hysterectomy in 2004, who presented with vaginal pressure and urinary incontinence. On pelvic examination, the patient was noted to have almost a third- degree cystocele and a second-degree rectocele.  The apex appeared well supported.  Multichannel urodynamic testing with reduction of the prolapse confirmed the presence of genuine stress incontinence.  The patient wished for surgical evaluation and treatment and a plan was made to proceed with an anterior and posterior colporrhaphy with a TVT Exact midurethral sling and cystoscopy.  Risks, benefits, and alternatives were reviewed with the patient, who wished to proceed.  FINDINGS:  Examination under anesthesia confirmed a third-degree cystocele.  There was a second-degree high rectocele.  The cervix was absent.  Cystoscopy at termination of the placement of the sling and then again at the termination of the procedure documented the bladder to be intact throughout 360  degrees.  There was a normal bladder dome and trigone. The ureters were patent bilaterally with both cystoscopic exams.  There was no evidence of a foreign body in the bladder, the urethra.  DESCRIPTION OF PROCEDURE:  The patient was reidentified in the preoperative hold area.  She did receive cefotetan 2 g IV for antibiotic prophylaxis and she received PAS stockings and TED hose for DVT prophylaxis.  In the operating room, the patient was placed in the dorsal lithotomy position with Allen stirrups.  General endotracheal anesthesia was induced.  The lower abdomen, vagina and perineum were then sterilely prepped and draped.  An examination under anesthesia was performed.  The Foley catheter was sterilely placed inside the bladder and left to gravity drainage throughout and at termination of the procedure.  The anterior colporrhaphy was performed first.  Allis clamps were used along the anterior vaginal mucosa in the midline beginning at the vaginal apex and extending to 1 cm below the urethral meatus.  The mucosa was injected locally with 1% lidocaine with epinephrine, 1:100,000.  A vertical midline incision was created with a scalpel. Sharp dissection with a Metzenbaum scissors and blunt dissection were used to dissect this sub vaginal tissue and bladder off the overlying vaginal mucosa bilaterally.  Hemostasis during the dissection was performed with monopolar cautery.  There was a bleeding vein along the left anterior bladder, which responded to figure-of-eight sutures  of 2-0 Vicryl.  The cystocele was reduced at this time by placing vertical mattress sutures of 0 Vicryl.  The TVT Exact was performed next.  The 1-cm suprapubic incisions were created 2 cm to the right and left of the midline.  The Foley catheter was removed and the Foley tip with the obturator guide was then placed inside the bladder.  The urethra was deflected properly.  The TVT Exact was placed in a bottom-up  fashion beginning on the patient's right-hand side.  The needle, guide and obturator were brought through the right retropubic space and out the right suprapubic ipsilateral incision.  The urethra was deflected in the opposite direction and the needle and obturator guide were then brought through the left retropubic space and suprapubic incision.  Cystoscopy was performed at this time and the findings were as noted above.  The bladder was drained of all cystoscopic fluid and the Foley catheter was replaced.  The midurethral sling was brought up through the suprapubic incisions bilaterally.  A Kelly clamp was placed between the urethra and the mesh material, and the plastic sheaths were then removed.  The sling was noted to be in excellent position.  Excess sling was trimmed suprapubically.  Excess vaginal mucosa was trimmed and the anterior vaginal wall mucosa was closed with a running lock suture of 2-0 Vicryl.  The posterior colporrhaphy was performed next.  Allis clamps were used to mark the perineal body and the posterior vaginal mucosa in the midline.  The perineal body and the posterior vaginal mucosa were then injected with 1% lidocaine with epinephrine, 1:100,000.  The triangular wedge of epithelium was excised from the perineal body.  The posterior vaginal wall mucosa was opened with Metzenbaum scissors.  Sharp and blunt dissection were used to dissect the rectovaginal fascia off the vaginal mucosa bilaterally.  Again, hemostasis was created with monopolar cautery.  The rectocele was reduced by placing vertical mattress sutures of 0 Vicryl.  Excess vaginal mucosa was trimmed posteriorly and the posterior vaginal mucosa was closed at this time with a running lock suture of 2-0 Vicryl.  This was brought down to the hymen.  The suture was then brought underneath the hymen and used to close the superficial perineal muscles in a running fashion.  Rectal exam was performed at this  time and there was a defect noted at the vaginal apex between the cystocele and rectocele repair.  Allis clamps were placed across the vaginal mucosa in the midline where this defect was located.  The mucosa was injected with 1% lidocaine with epinephrine 1:100,000.  The mucosa was then incised vertically in the midline with a scalpel.  Sharp dissection was used to dissect the vaginal mucosa off the underlying tissues.  With the gloved hand in the rectum, the defect was repaired by placing vertical mattress sutures of 0 Vicryl, which reduced this defect.  Excess vaginal mucosa was then trimmed at this time and this area was closed with a running lock suture of 2-0 Vicryl.  Repeat cystoscopy was performed at this time and the findings were again as noted above.  All cystoscopic fluid was drained and the Foley catheter was replaced and left to gravity drainage.  The final suturing along the perineal body was performed in a subcuticular fashion.  Final rectal exam confirmed the absence of sutures or foreign body in the rectum.  The suprapubic incisions were closed with Dermabond.  A vaginal packing with Estrace cream was then placed in the vagina.  This concluded the patient's procedure.  There were no complications. All needle, instrument, and sponge counts were correct.  The patient was escorted to the recovery room in stable and awake condition.     Lenard Galloway, M.D.     BES/MEDQ  D:  10/16/2015  T:  10/17/2015  Job:  [id number]

## 2015-10-17 NOTE — Progress Notes (Signed)
Discharge teaching complete. Pt understood all information and did not have any questions. Pt pushed via wheelchair and discharged home to family. 

## 2015-10-22 ENCOUNTER — Encounter: Payer: Self-pay | Admitting: Obstetrics and Gynecology

## 2015-10-22 ENCOUNTER — Ambulatory Visit (INDEPENDENT_AMBULATORY_CARE_PROVIDER_SITE_OTHER): Payer: Medicare Other | Admitting: Obstetrics and Gynecology

## 2015-10-22 VITALS — BP 126/70 | HR 88 | Resp 16 | Ht 62.0 in | Wt 166.0 lb

## 2015-10-22 DIAGNOSIS — R339 Retention of urine, unspecified: Secondary | ICD-10-CM | POA: Diagnosis not present

## 2015-10-22 DIAGNOSIS — Z9889 Other specified postprocedural states: Secondary | ICD-10-CM

## 2015-10-22 NOTE — Progress Notes (Signed)
GYNECOLOGY  VISIT   HPI: 60 y.o.   Significant Other  Caucasian  female   [id number] with Patient's last menstrual period was 03/07/2004 (approximate).   here for   Foley catheter removal.  Was discharged to home with a Foley catheter following prolapse and incontinence surgery due to urinary retention.  Partner Gerald Stabs is here today with patient.    Last Tramadol was 2 days ago.  Advil 400 mg - 600 mg working well.  No nausea.  Miralax working well.  Brownish vaginal drainage.  Very little.   GYNECOLOGIC HISTORY: Patient's last menstrual period was 03/07/2004 (approximate). Contraception:  Post-hysterectomy Menopausal hormone therapy:  none Last mammogram:  07/26/15 BIRADS1 negative Last pap smear:   04/21/2006 HR HPV negative       OB History    Gravida Para Term Preterm AB TAB SAB Ectopic Multiple Living   3 2 2  0 1 0 1 0 0 2         Patient Active Problem List   Diagnosis Date Noted  . Status post surgery 10/16/2015  . MGUS (monoclonal gammopathy of unknown significance) 03/15/2015  . Tobacco abuse 03/14/2014  . Monoclonal (M) protein disease, multiple 'M' protein   . Osteoarthritis   . Mitral valve prolapse   . HTN (hypertension)   . Hyperlipidemia     Past Medical History  Diagnosis Date  . Hyperlipidemia   . HTN (hypertension)   . Mitral valve prolapse   . GERD (gastroesophageal reflux disease)   . Depression   . Monoclonal (M) protein disease, multiple 'M' protein   . Cervical disc disease   . Migraine headache   . MGUS (monoclonal gammopathy of unknown significance) 03/15/2015  . Osteoarthritis     generalized  . Complication of anesthesia     prolonged sedation  . PONV (postoperative nausea and vomiting)     Past Surgical History  Procedure Laterality Date  . Knee arthroscopy      right knee x 2  . Meniscus repair      right knee x 1  . Cervical disc surgery  6/06  . Tonsilectomy, adenoidectomy, bilateral myringotomy and tubes    . Breast biopsy       negative x 2   . Appendectomy  1974  . Tubal ligation    . Total vaginal hysterectomy  05/2004    adenomyosis, prolapse--ovaries remain  . Bunionectomy      w/hammer toe repair  . Tens      upper and lower back  . Carpal tunnel release Right   . Anterior and posterior repair N/A 10/16/2015    Procedure: ANTERIOR (CYSTOCELE) AND POSTERIOR REPAIR (RECTOCELE);  Surgeon: Nunzio Cobbs, MD;  Location: [facility];  Service: Gynecology;  Laterality: N/A;  . Bladder suspension N/A 10/16/2015    Procedure: TRANSVAGINAL TAPE (TVT) PROCEDURE exact midurethral sling;  Surgeon: Nunzio Cobbs, MD;  Location: [facility];  Service: Gynecology;  Laterality: N/A;  . Cysto N/A 10/16/2015    Procedure: Kathrene Alu;  Surgeon: Nunzio Cobbs, MD;  Location: [facility];  Service: Gynecology;  Laterality: N/A;    Current Outpatient Prescriptions  Medication Sig Dispense Refill  . acetaminophen (TYLENOL) 500 MG tablet Take 1,000 mg by mouth daily as needed for mild pain. Take 1000 mg daily    . ALPRAZolam (XANAX) 0.5 MG tablet Take 0.25-0.5 mg by mouth 2 (two) times daily as needed for anxiety.     Marland Kitchen aspirin  EC 81 MG tablet Take 81 mg by mouth daily.    . Cholecalciferol (VITAMIN D-3) 5000 UNITS TABS Take 1 tablet by mouth daily.    . ciprofloxacin (CIPRO) 250 MG tablet Take one tablet (250 mg) by mouth twice a day until catheter is removed. 14 tablet 0  . Coenzyme Q10 (CO Q 10) 100 MG CAPS Take 1 capsule by mouth daily.    Marland Kitchen desvenlafaxine (PRISTIQ) 100 MG 24 hr tablet Take 100 mg by mouth daily.    . fenofibrate 160 MG tablet Take 160 mg by mouth daily.    Marland Kitchen ibuprofen (ADVIL,MOTRIN) 600 MG tablet Take 1 tablet (600 mg total) by mouth every 6 (six) hours as needed (mild pain). 30 tablet 0  . losartan (COZAAR) 50 MG tablet Take 50 mg by mouth daily.    . magnesium oxide (MAG-OX) 400 MG tablet Take 400 mg by mouth daily.    . metoprolol succinate (TOPROL-XL) 100 MG 24 hr tablet Take 100 mg by  mouth daily. Take with or immediately following a meal.    . Omega-3 Fatty Acids (FISH OIL PO) Take 700 mg by mouth daily.    Marland Kitchen omeprazole (PRILOSEC) 20 MG capsule Take 20 mg by mouth daily.    . prochlorperazine (COMPAZINE) 10 MG tablet Take 1 tablet (10 mg total) by mouth every 6 (six) hours as needed for nausea or vomiting. 10 tablet 0  . tizanidine (ZANAFLEX) 2 MG capsule Take 2 mg by mouth every 4 (four) hours as needed for muscle spasms.    . traMADol (ULTRAM) 50 MG tablet Take 1 tablet (50 mg total) by mouth every 6 (six) hours as needed for moderate pain (when taking po well.). 30 tablet 0   No current facility-administered medications for this visit.     ALLERGIES: Hydrocodone and Percocet  Family History  Problem Relation Age of Onset  . Heart failure Mother     congestive  . Diabetes Mother   . Hypertension Mother   . Heart disease Father     MI  . Diabetes Father   . Heart attack Father   . Cancer Brother 69    lung cancer  . Cancer Brother     metastasized, unknown origin  . Colon cancer Paternal Grandmother   . Diabetes Brother     Social History   Social History  . Marital Status: Significant Other    Spouse Name: N/A  . Number of Children: 2  . Years of Education: N/A   Occupational History  .      Intake coordinator   Social History Main Topics  . Smoking status: Current Every Day Smoker -- 0.50 packs/day for 35 years    Types: Cigarettes  . Smokeless tobacco: Never Used  . Alcohol Use: No  . Drug Use: No  . Sexual Activity: No     Comment: Hyst   Other Topics Concern  . Not on file   Social History Narrative    ROS:  Pertinent items are noted in HPI.  PHYSICAL EXAMINATION:    BP 126/70 mmHg  Pulse 88  Resp 16  Ht 5\' 2"  (1.575 m)  Wt 166 lb (75.297 kg)  BMI 30.35 kg/m2  LMP 03/07/2004 (Approximate)    General appearance: alert, cooperative and appears stated age    Pelvic: External genitalia:  SP incisions intact.                Urethra:  normal appearing urethra with no masses,  tenderness or lesions              Bartholins and Skenes: normal                 Vagina: normal appearing vagina with normal color and discharge, no lesions.. Suture lines intact and no agglutination.   Sling protected.               Cervix: no lesions         Bimanual Exam:  Uterus:  uterus absent              Adnexa: no mass, fullness, tenderness              Voiding trail - Voided 150 cc.. Sterile PVR check after verbal consent -  Sterile prep with betadine.  Sterile cath for 150 cc.  Clear and yellow.  Discarded.   Chaperone was present for exam.  ASSESSMENT  Status post anterior and posterior colporrhaphy with TVT and cystoscopy.  Urinary retention - resolved.  Voiding adequately.   PLAN  Return for voiding difficulty.  Follow up for 6 week post op visit.    An After Visit Summary was printed and given to the patient.

## 2015-10-24 NOTE — Discharge Summary (Signed)
Physician Discharge Summary  Patient ID: FABIHA RIELLY MRN: [id number] DOB/AGE: [birth year] 60 y.o.  Admit date: 10/16/2015 Discharge date:  10/17/15  Admission Diagnoses: 1.  Cystocele. 2.  Rectocele. 3.  Genuine stress incontinence.  Discharge Diagnoses:  1.  Cystocele. 2.  Rectocele. 3.  Genuine stress incontinence. 4.  Status post anterior and posterior colporrhaphy, TVT Exact midurethral sling and cystoscopy.  5.  Urinary retention post op.  Active Problems:   Status post surgery   Discharged Condition: good  Hospital Course:  The patient was admitted on 10/16/15 for an anterior and posterior colporrhaphy, TVT Exact midurethral sling and cystoscopy which were performed without complication while under general anesthesia.  The patient's post op course was uneventful.  She had a morphine PCA and Toradol for pain control initially, and this was converted over to Tramadol and Motrin on post op day one when the patient began taking po well.  She did have nausea post op which was treated with Zofran and Compazine.  She ambulated independently and wore PAS and Ted hose for DVT prophylaxis while in bed.  Her foley catheter were removed on post op day one, and she had urinary retention and was unable to void.  Her foley catheter with a leg bag was therefore placed for discharge.  The patient's vital signs remained stable and she demonstrated no signs of infection during her hospitalization.  The patient's post op day one Hgb was 10.8.   She was tolerating the this well.  She had very minimal vaginal bleeding, and her incision(s) demonstrated no signs of erythema or significant drainage.  She was found to be in good condition and ready for discharge on post op day one.   Consults: None  Significant Diagnostic Studies: labs: See hospital course.   Treatments: surgery:  anterior and posterior colporrhaphy, TVT Exact midurethral sling and cystoscopy   Discharge Exam: Blood pressure  134/75, pulse 68, temperature 98.3 F (36.8 C), temperature source Oral, resp. rate 16, height 5\' 3"  (1.6 m), weight 168 lb (76.204 kg), last menstrual period 03/07/2004, SpO2 100 %. I/O - 3960.5 cc/2550 cc  General: alert and cooperative Resp: clear to auscultation bilaterally Cardio: regular rate and rhythm, S1, S2 normal, no murmur, click, rub or gallop GI: incision: clean, dry and intact and Scant bowel sounds, soft, and nontender. No masses. Extremities: Ted hose on.  Vaginal Bleeding: minimal   Disposition: 01-Home or Self Care  Post op instructions and precautions were reviewed in verbal and written form.  The patient will take Ciprofloxacin 250 mg po bid while her foley catheter is in place.    Medication List    TAKE these medications        acetaminophen 500 MG tablet  Commonly known as:  TYLENOL  Take 1,000 mg by mouth daily as needed for mild pain. Take 1000 mg daily     ALPRAZolam 0.5 MG tablet  Commonly known as:  XANAX  Take 0.25-0.5 mg by mouth 2 (two) times daily as needed for anxiety.     aspirin EC 81 MG tablet  Take 81 mg by mouth daily.     ciprofloxacin 250 MG tablet  Commonly known as:  CIPRO  Take one tablet (250 mg) by mouth twice a day until catheter is removed.     Co Q 10 100 MG Caps  Take 1 capsule by mouth daily.     desvenlafaxine 100 MG 24 hr tablet  Commonly known as:  PRISTIQ  Take 100  mg by mouth daily.     fenofibrate 160 MG tablet  Take 160 mg by mouth daily.     FISH OIL PO  Take 700 mg by mouth daily.     ibuprofen 600 MG tablet  Commonly known as:  ADVIL,MOTRIN  Take 1 tablet (600 mg total) by mouth every 6 (six) hours as needed (mild pain).     losartan 50 MG tablet  Commonly known as:  COZAAR  Take 50 mg by mouth daily.     magnesium oxide 400 MG tablet  Commonly known as:  MAG-OX  Take 400 mg by mouth daily.     metoprolol succinate 100 MG 24 hr tablet  Commonly known as:  TOPROL-XL  Take 100 mg by mouth  daily. Take with or immediately following a meal.     omeprazole 20 MG capsule  Commonly known as:  PRILOSEC  Take 20 mg by mouth daily.     prochlorperazine 10 MG tablet  Commonly known as:  COMPAZINE  Take 1 tablet (10 mg total) by mouth every 6 (six) hours as needed for nausea or vomiting.     tizanidine 2 MG capsule  Commonly known as:  ZANAFLEX  Take 2 mg by mouth every 4 (four) hours as needed for muscle spasms.     traMADol 50 MG tablet  Commonly known as:  ULTRAM  Take 1 tablet (50 mg total) by mouth every 6 (six) hours as needed for moderate pain (when taking po well.).     Vitamin D-3 5000 UNITS Tabs  Take 1 tablet by mouth daily.           Follow-up Information    Follow up with Arloa Koh, MD In 5 days.   Specialty:  Obstetrics and Gynecology   Contact information:   [address] [place] [address] [phone]       Signed: Arloa Koh 10/24/2015, 12:12 PM

## 2015-11-28 ENCOUNTER — Encounter: Payer: Self-pay | Admitting: Obstetrics and Gynecology

## 2015-11-28 ENCOUNTER — Ambulatory Visit (INDEPENDENT_AMBULATORY_CARE_PROVIDER_SITE_OTHER): Payer: Medicare Other | Admitting: Obstetrics and Gynecology

## 2015-11-28 VITALS — BP 132/70 | HR 64 | Ht 62.0 in | Wt 170.4 lb

## 2015-11-28 DIAGNOSIS — Z9889 Other specified postprocedural states: Secondary | ICD-10-CM

## 2015-11-28 DIAGNOSIS — Z789 Other specified health status: Secondary | ICD-10-CM

## 2015-11-28 DIAGNOSIS — R3989 Other symptoms and signs involving the genitourinary system: Secondary | ICD-10-CM

## 2015-11-28 NOTE — Progress Notes (Signed)
Patient ID: [name], female   DOB: Oct 12, 1955, 60 y.o.   MRN: [id number] GYNECOLOGY  VISIT   HPI: 60 y.o.   Significant Other  Caucasian  female   [id number] with Patient's last menstrual period was 03/07/2004 (approximate).   here for 6 week follow up ANTERIOR (CYSTOCELE) AND POSTERIOR REPAIR (RECTOCELE) (N/A Vagina )  TRANSVAGINAL TAPE (TVT) PROCEDURE exact midurethral sling (N/A Vagina )CYSTO (N/A Bladder).  Having difficulty emptying bladder completely.   Stands up and the rest comes out.  No dysuria.  Some urgency occasionally. This is not a common occurrence.  No leak with laugh, cough, or sneeze.   Normal BMs.  Using Miralax.  No bleeding.   Vaginal discharge and drainage has stopped.  Usually up twice at night to void.  Drinks a lot of water.  GYNECOLOGIC HISTORY: Patient's last menstrual period was 03/07/2004 (approximate). Contraception:  Hysterectomy Menopausal hormone therapy:  None Last mammogram:  07/26/15 BIRADS1 negative Last pap smear: 04/21/2006 HR HPV negative          OB History    Gravida Para Term Preterm AB TAB SAB Ectopic Multiple Living   3 2 2  0 1 0 1 0 0 2         Patient Active Problem List   Diagnosis Date Noted  . Status post surgery 10/16/2015  . MGUS (monoclonal gammopathy of unknown significance) 03/15/2015  . Tobacco abuse 03/14/2014  . Monoclonal (M) protein disease, multiple 'M' protein   . Osteoarthritis   . Mitral valve prolapse   . HTN (hypertension)   . Hyperlipidemia     Past Medical History  Diagnosis Date  . Hyperlipidemia   . HTN (hypertension)   . Mitral valve prolapse   . GERD (gastroesophageal reflux disease)   . Depression   . Monoclonal (M) protein disease, multiple 'M' protein   . Cervical disc disease   . Migraine headache   . MGUS (monoclonal gammopathy of unknown significance) 03/15/2015  . Osteoarthritis     generalized  . Complication of anesthesia     prolonged sedation  . PONV  (postoperative nausea and vomiting)     Past Surgical History  Procedure Laterality Date  . Knee arthroscopy      right knee x 2  . Meniscus repair      right knee x 1  . Cervical disc surgery  6/06  . Tonsilectomy, adenoidectomy, bilateral myringotomy and tubes    . Breast biopsy      negative x 2   . Appendectomy  1974  . Tubal ligation    . Total vaginal hysterectomy  05/2004    adenomyosis, prolapse--ovaries remain  . Bunionectomy      w/hammer toe repair  . Tens      upper and lower back  . Carpal tunnel release Right   . Anterior and posterior repair N/A 10/16/2015    Procedure: ANTERIOR (CYSTOCELE) AND POSTERIOR REPAIR (RECTOCELE);  Surgeon: Nunzio Cobbs, MD;  Location: [facility];  Service: Gynecology;  Laterality: N/A;  . Bladder suspension N/A 10/16/2015    Procedure: TRANSVAGINAL TAPE (TVT) PROCEDURE exact midurethral sling;  Surgeon: Nunzio Cobbs, MD;  Location: [facility];  Service: Gynecology;  Laterality: N/A;  . Cysto N/A 10/16/2015    Procedure: Kathrene Alu;  Surgeon: Nunzio Cobbs, MD;  Location: [facility];  Service: Gynecology;  Laterality: N/A;    Current Outpatient Prescriptions  Medication Sig Dispense Refill  . acetaminophen (  TYLENOL) 500 MG tablet Take 1,000 mg by mouth daily as needed for mild pain. Take 1000 mg daily    . ALPRAZolam (XANAX) 0.5 MG tablet Take 0.25-0.5 mg by mouth 2 (two) times daily as needed for anxiety.     Marland Kitchen aspirin EC 81 MG tablet Take 81 mg by mouth daily.    . Cholecalciferol (VITAMIN D-3) 5000 UNITS TABS Take 1 tablet by mouth daily.    . ciprofloxacin (CIPRO) 250 MG tablet Take one tablet (250 mg) by mouth twice a day until catheter is removed. 14 tablet 0  . Coenzyme Q10 (CO Q 10) 100 MG CAPS Take 1 capsule by mouth daily.    Marland Kitchen desvenlafaxine (PRISTIQ) 100 MG 24 hr tablet Take 100 mg by mouth daily.    . fenofibrate 160 MG tablet Take 160 mg by mouth daily.    Marland Kitchen ibuprofen (ADVIL,MOTRIN) 600 MG tablet Take 1  tablet (600 mg total) by mouth every 6 (six) hours as needed (mild pain). 30 tablet 0  . losartan (COZAAR) 50 MG tablet Take 50 mg by mouth daily.    . magnesium oxide (MAG-OX) 400 MG tablet Take 400 mg by mouth daily.    . metoprolol succinate (TOPROL-XL) 100 MG 24 hr tablet Take 100 mg by mouth daily. Take with or immediately following a meal.    . Omega-3 Fatty Acids (FISH OIL PO) Take 700 mg by mouth daily.    Marland Kitchen omeprazole (PRILOSEC) 20 MG capsule Take 20 mg by mouth daily.    . prochlorperazine (COMPAZINE) 10 MG tablet Take 1 tablet (10 mg total) by mouth every 6 (six) hours as needed for nausea or vomiting. 10 tablet 0  . tizanidine (ZANAFLEX) 2 MG capsule Take 2 mg by mouth every 4 (four) hours as needed for muscle spasms.    . traMADol (ULTRAM) 50 MG tablet Take 1 tablet (50 mg total) by mouth every 6 (six) hours as needed for moderate pain (when taking po well.). 30 tablet 0   No current facility-administered medications for this visit.     ALLERGIES: Hydrocodone and Percocet  Family History  Problem Relation Age of Onset  . Heart failure Mother     congestive  . Diabetes Mother   . Hypertension Mother   . Heart disease Father     MI  . Diabetes Father   . Heart attack Father   . Cancer Brother 40    lung cancer  . Cancer Brother     metastasized, unknown origin  . Colon cancer Paternal Grandmother   . Diabetes Brother     Social History   Social History  . Marital Status: Significant Other    Spouse Name: N/A  . Number of Children: 2  . Years of Education: N/A   Occupational History  .      Intake coordinator   Social History Main Topics  . Smoking status: Current Every Day Smoker -- 0.50 packs/day for 35 years    Types: Cigarettes  . Smokeless tobacco: Never Used  . Alcohol Use: No  . Drug Use: No  . Sexual Activity: No     Comment: Hyst   Other Topics Concern  . Not on file   Social History Narrative    ROS:  Pertinent items are noted in  HPI.  PHYSICAL EXAMINATION:    BP 132/70 mmHg  Pulse 64  Ht 5\' 2"  (1.575 m)  Wt 170 lb 6.4 oz (77.293 kg)  BMI 31.16 kg/m2  LMP 03/07/2004 (Approximate)    General appearance: alert, cooperative and appears stated age Wearing TENS unit today.   Pelvic: External genitalia:  no lesions              Urethra:  normal appearing urethra with no masses, tenderness or lesions              Bartholins and Skenes: normal                 Vagina: normal appearing vagina with normal color and discharge, no lesions.  Sutures present.  Sling protected.  Good support.              Cervix: absent         Bimanual Exam:  Uterus:  uterus absent              Adnexa: no mass, fullness, tenderness      Procedure - catheterization for PVR.  Emptied bladder about 45 minutes ago.  Verbal consent for procedure.  Sterile prep with betadine.  Small cath tip used to check PVR - 150 cc.  Urine discarded.  Chaperone was present for exam.  ASSESSMENT  Status post anterior and posterior colporrhaphy, TVT Exact midurethral sling and cystoscopy.  Doing well overall. Some urinary retention which resolves with position change.  No significant retention today.   PLAN  Call for increasing voiding problems.  I expect this to improve with time.  I would not intervene with loosening of sling.  Follow up in 6 weeks.  Cautioned against doing lifting and heavy physical activity.     An After Visit Summary was printed and given to the patient.

## 2016-01-03 DIAGNOSIS — G4733 Obstructive sleep apnea (adult) (pediatric): Secondary | ICD-10-CM

## 2016-01-03 HISTORY — DX: Obstructive sleep apnea (adult) (pediatric): G47.33

## 2016-01-11 ENCOUNTER — Ambulatory Visit (INDEPENDENT_AMBULATORY_CARE_PROVIDER_SITE_OTHER): Payer: Medicare Other | Admitting: Obstetrics and Gynecology

## 2016-01-11 ENCOUNTER — Encounter: Payer: Self-pay | Admitting: Obstetrics and Gynecology

## 2016-01-11 VITALS — BP 130/68 | HR 80 | Resp 20 | Wt 168.4 lb

## 2016-01-11 DIAGNOSIS — R339 Retention of urine, unspecified: Secondary | ICD-10-CM | POA: Diagnosis not present

## 2016-01-11 MED ORDER — DIAZEPAM 2 MG PO TABS: 2.0000 mg | ORAL_TABLET | Freq: Every evening | ORAL | Status: DC | PRN

## 2016-01-11 NOTE — Patient Instructions (Signed)
Diazepam tablets What is this medicine? DIAZEPAM (dye AZ e pam) is a benzodiazepine. It is used to treat anxiety and nervousness. It also can help treat alcohol withdrawal, relax muscles, and treat certain types of seizures. This medicine may be used for other purposes; ask your health care provider or pharmacist if you have questions. What should I tell my health care provider before I take this medicine? They need to know if you have any of these conditions -an alcohol or drug abuse problem -bipolar disorder, depression, psychosis or other mental health condition -glaucoma -kidney or liver disease -lung or breathing disease -myasthenia gravis -Parkinson's disease -seizures or a history of seizures -suicidal thoughts -an unusual or allergic reaction to diazepam, other benzodiazepines, foods, dyes, or preservatives -pregnant or trying to get pregnant -breast-feeding How should I use this medicine? Take this medicine by mouth with a glass of water. Follow the directions on the prescription label. If this medicine upsets your stomach, take it with food or milk. Take your doses at regular intervals. Do not take your medicine more often than directed. If you have been taking this medicine regularly for some time, do not suddenly stop taking it. You must gradually reduce the dose or you may get severe side effects. Ask your doctor or health care professional for advice. Even after you stop taking this medicine it can still affect your body for several days. Talk to your pediatrician regarding the use of this medicine in children. Special care may be needed. Overdosage: If you think you have taken too much of this medicine contact a poison control center or emergency room at once. NOTE: This medicine is only for you. Do not share this medicine with others. What if I miss a dose? If you miss a dose, take it as soon as you can. If it is almost time for your next dose, take only that dose. Do not take  double or extra doses. What may interact with this medicine? -cimetidine -grapefruit juice -herbal or dietary supplements like kava kava, melatonin, St. John's Wort, or valerian -medicines for anxiety or sleeping problems, like alprazolam, lorazepam, or triazolam -medicines for depression, mental problems or psychiatric disturbances -medicines for HIV infection or AIDS -prescription pain medicines -rifampin, rifapentine, or rifabutin -some medicines for seizures like carbamazepine, phenobarbital, phenytoin, or primidone This list may not describe all possible interactions. Give your health care provider a list of all the medicines, herbs, non-prescription drugs, or dietary supplements you use. Also tell them if you smoke, drink alcohol, or use illegal drugs. Some items may interact with your medicine. What should I watch for while using this medicine? Visit your doctor or health care professional for regular checks on your progress. Your body can become dependent on this medicine. Ask your doctor or health care professional if you still need to take it. You may get drowsy or dizzy. Do not drive, use machinery, or do anything that needs mental alertness until you know how this medicine affects you. To reduce the risk of dizzy and fainting spells, do not stand or sit up quickly, especially if you are an older patient. Alcohol may increase dizziness and drowsiness. Avoid alcoholic drinks. Do not treat yourself for coughs, colds or allergies without asking your doctor or health care professional for advice. Some ingredients can increase possible side effects. What side effects may I notice from receiving this medicine? Side effects that you should report to your doctor or health care professional as soon as possible: -allergic reactions  like skin rash, itching or hives, swelling of the face, lips, or tongue -angry, confused, depressed, other mood changes -breathing problems -feeling faint or  lightheaded, falls -muscle cramps -problems with balance, talking, walking -restlessness -tremors -trouble passing urine or change in the amount of urine -unusually weak or tired Side effects that usually do not require medical attention (report to your doctor or health care professional if they continue or are bothersome): -difficulty sleeping, nightmares -dizziness, drowsiness, clumsiness, or unsteadiness, a hangover effect -headache -nausea, vomiting This list may not describe all possible side effects. Call your doctor for medical advice about side effects. You may report side effects to FDA at 1-800-FDA-1088. Where should I keep my medicine? Keep out of the reach of children. This medicine can be abused. Keep your medicine in a safe place to protect it from theft. Do not share this medicine with anyone. Selling or giving away this medicine is dangerous and against the law. This medicine may cause accidental overdose and death if taken by other adults, children, or pets. Mix any unused medicine with a substance like cat litter or coffee grounds. Then throw the medicine away in a sealed container like a sealed bag or a coffee can with a lid. Do not use the medicine after the expiration date. Store at room temperature between 15 and 30 degrees C (59 and 86 degrees F). Protect from light. Keep container tightly closed. NOTE: This sheet is a summary. It may not cover all possible information. If you have questions about this medicine, talk to your doctor, pharmacist, or health care provider.    2016, [employer]vier/Gold Standard. (2014-03-14 15:16:42)

## 2016-01-11 NOTE — Progress Notes (Signed)
GYNECOLOGY  VISIT   HPI: 60 y.o.   Significant Other  Caucasian  female   [id number] with Patient's last menstrual period was 03/07/2004 (approximate).   here for   12 week post op check.  Status post anterior and posterior colporrhaphy with TVT Exact midurethral sling.   Feels like she is not emptying completely.  Double voiding in order to completely empty.  In the am for her first morning void, emptying every 15 minutes for the first 1.5 hours.  Up 2 - 3 times per night.  Some urgency.  Not leaking urine at all.   Feels like her urinary pattern is worse than prior to surgery but happy overall that she did surgery.   Takes Losartan in the evening.   GYNECOLOGIC HISTORY: Patient's last menstrual period was 03/07/2004 (approximate). Contraception:  Postmenopausal  Menopausal hormone therapy:  No  Last mammogram:  07/26/2015 BIRADS 1 negative  Last pap smear:   2007 HR HPV negative         OB History    Gravida Para Term Preterm AB TAB SAB Ectopic Multiple Living   '3 2 2 '$ 0 1 0 1 0 0 2         Patient Active Problem List   Diagnosis Date Noted  . Status post surgery 10/16/2015  . MGUS (monoclonal gammopathy of unknown significance) 03/15/2015  . Tobacco abuse 03/14/2014  . Monoclonal (M) protein disease, multiple 'M' protein   . Osteoarthritis   . Mitral valve prolapse   . HTN (hypertension)   . Hyperlipidemia     Past Medical History  Diagnosis Date  . Hyperlipidemia   . HTN (hypertension)   . Mitral valve prolapse   . GERD (gastroesophageal reflux disease)   . Depression   . Monoclonal (M) protein disease, multiple 'M' protein   . Cervical disc disease   . Migraine headache   . MGUS (monoclonal gammopathy of unknown significance) 03/15/2015  . Osteoarthritis     generalized  . Complication of anesthesia     prolonged sedation  . PONV (postoperative nausea and vomiting)     Past Surgical History  Procedure Laterality Date  . Knee arthroscopy      right knee  x 2  . Meniscus repair      right knee x 1  . Cervical disc surgery  6/06  . Tonsilectomy, adenoidectomy, bilateral myringotomy and tubes    . Breast biopsy      negative x 2   . Appendectomy  1974  . Tubal ligation    . Total vaginal hysterectomy  05/2004    adenomyosis, prolapse--ovaries remain  . Bunionectomy      w/hammer toe repair  . Tens      upper and lower back  . Carpal tunnel release Right   . Anterior and posterior repair N/A 10/16/2015    Procedure: ANTERIOR (CYSTOCELE) AND POSTERIOR REPAIR (RECTOCELE);  Surgeon: Nunzio Cobbs, MD;  Location: [facility];  Service: Gynecology;  Laterality: N/A;  . Bladder suspension N/A 10/16/2015    Procedure: TRANSVAGINAL TAPE (TVT) PROCEDURE exact midurethral sling;  Surgeon: Nunzio Cobbs, MD;  Location: [facility];  Service: Gynecology;  Laterality: N/A;  . Cysto N/A 10/16/2015    Procedure: Kathrene Alu;  Surgeon: Nunzio Cobbs, MD;  Location: [facility];  Service: Gynecology;  Laterality: N/A;    Current Outpatient Prescriptions  Medication Sig Dispense Refill  . acetaminophen (TYLENOL) 500 MG tablet Take 1,000  mg by mouth daily as needed for mild pain. Take 1000 mg daily    . ALPRAZolam (XANAX) 0.5 MG tablet Take 0.25-0.5 mg by mouth 2 (two) times daily as needed for anxiety.     Marland Kitchen aspirin EC 81 MG tablet Take 81 mg by mouth daily.    . Cholecalciferol (VITAMIN D-3) 5000 UNITS TABS Take 1 tablet by mouth daily.    . ciprofloxacin (CIPRO) 250 MG tablet Take one tablet (250 mg) by mouth twice a day until catheter is removed. 14 tablet 0  . Coenzyme Q10 (CO Q 10) 100 MG CAPS Take 1 capsule by mouth daily.    Marland Kitchen desvenlafaxine (PRISTIQ) 100 MG 24 hr tablet Take 100 mg by mouth daily.    . fenofibrate 160 MG tablet Take 160 mg by mouth daily.    Marland Kitchen ibuprofen (ADVIL,MOTRIN) 600 MG tablet Take 1 tablet (600 mg total) by mouth every 6 (six) hours as needed (mild pain). 30 tablet 0  . losartan (COZAAR) 50 MG tablet Take 50 mg by  mouth daily.    . magnesium oxide (MAG-OX) 400 MG tablet Take 400 mg by mouth daily.    . metoprolol succinate (TOPROL-XL) 100 MG 24 hr tablet Take 100 mg by mouth daily. Take with or immediately following a meal.    . Omega-3 Fatty Acids (FISH OIL PO) Take 700 mg by mouth daily.    Marland Kitchen omeprazole (PRILOSEC) 20 MG capsule Take 20 mg by mouth daily.    . prochlorperazine (COMPAZINE) 10 MG tablet Take 1 tablet (10 mg total) by mouth every 6 (six) hours as needed for nausea or vomiting. 10 tablet 0  . tizanidine (ZANAFLEX) 2 MG capsule Take 2 mg by mouth every 4 (four) hours as needed for muscle spasms.    . traMADol (ULTRAM) 50 MG tablet Take 1 tablet (50 mg total) by mouth every 6 (six) hours as needed for moderate pain (when taking po well.). 30 tablet 0   No current facility-administered medications for this visit.     ALLERGIES: Hydrocodone and Percocet  Family History  Problem Relation Age of Onset  . Heart failure Mother     congestive  . Diabetes Mother   . Hypertension Mother   . Heart disease Father     MI  . Diabetes Father   . Heart attack Father   . Cancer Brother 16    lung cancer  . Cancer Brother     metastasized, unknown origin  . Colon cancer Paternal Grandmother   . Diabetes Brother     Social History   Social History  . Marital Status: Significant Other    Spouse Name: N/A  . Number of Children: 2  . Years of Education: N/A   Occupational History  .      Intake coordinator   Social History Main Topics  . Smoking status: Current Every Day Smoker -- 0.50 packs/day for 35 years    Types: Cigarettes  . Smokeless tobacco: Never Used  . Alcohol Use: No  . Drug Use: No  . Sexual Activity: No     Comment: Hyst   Other Topics Concern  . Not on file   Social History Narrative    ROS:  Pertinent items are noted in HPI.  PHYSICAL EXAMINATION:    LMP 03/07/2004 (Approximate)    General appearance: alert, cooperative and appears stated age   Pelvic:  External genitalia:  no lesions  Urethra:  normal appearing urethra with no masses, tenderness or lesions              Bartholins and Skenes: normal                 Vagina: normal appearing vagina with normal color and discharge, no lesions.                Cervix: absent            Bimanual Exam:  Uterus:  uterus absent.  Vagina feels somewhat shortened overall.               Adnexa: no mass, fullness, tenderness              Rectal exam: Yes.  .  Confirms.              Anus:  normal sphincter tone, no lesions  Chaperone was present for exam.  Procedure - PVR by nurse Karmen Bongo.  Void - 175 cc.  PVR - 238 cc.   Sterile cath kit used with betadine to cath patient.  Urine in sterile tube sent for urine culture.  ASSESSMENT  Urinary retention status post midurethral sling.  Patient had some evidence of urinary retention prior to surgery as well.   PLAN  I discussed options for care:  Valium, other options such as bethanechol, and lysis of the midurethral sling.  Lysis of the sling explained along with the risk of recurrence of stress incontinence.  Will try a course of Valium 2 mg po q hs.  Patient knows to avoid other hypnotics while taking this, i.e. Do not take xanax.  Follow up in 2 weeks.   An After Visit Summary was printed and given to the patient.

## 2016-01-13 LAB — URINE CULTURE
COLONY COUNT: NO GROWTH
Organism ID, Bacteria: NO GROWTH

## 2016-01-25 ENCOUNTER — Encounter: Payer: Self-pay | Admitting: Obstetrics and Gynecology

## 2016-01-25 ENCOUNTER — Ambulatory Visit (INDEPENDENT_AMBULATORY_CARE_PROVIDER_SITE_OTHER): Payer: Medicare Other | Admitting: Obstetrics and Gynecology

## 2016-01-25 VITALS — BP 140/76 | HR 60 | Ht 62.0 in | Wt 169.0 lb

## 2016-01-25 DIAGNOSIS — R339 Retention of urine, unspecified: Secondary | ICD-10-CM

## 2016-01-25 NOTE — Progress Notes (Signed)
GYNECOLOGY  VISIT   HPI: 60 y.o.   Significant Other  Caucasian  female   [id number] with Patient's last menstrual period was 03/07/2004 (approximate).   here for 2 week follow up after starting Valium for urinary retention. Medication is not helping.   "I want to clip the sling and be done."  Want to be able to void better and less often.   Had propofol with colonoscopy and did well.   Does not tolerate narcotics well.  Still had tramadol.   GYNECOLOGIC HISTORY: Patient's last menstrual period was 03/07/2004 (approximate). Contraception:  Postmenopausal Menopausal hormone therapy:  none Last mammogram: 07/26/2015 BIRADS 1 negative   Last pap smear:  2007 normal         OB History    Gravida Para Term Preterm AB TAB SAB Ectopic Multiple Living   3 2 2  0 1 0 1 0 0 2         Patient Active Problem List   Diagnosis Date Noted  . Status post surgery 10/16/2015  . MGUS (monoclonal gammopathy of unknown significance) 03/15/2015  . Tobacco abuse 03/14/2014  . Monoclonal (M) protein disease, multiple 'M' protein   . Osteoarthritis   . Mitral valve prolapse   . HTN (hypertension)   . Hyperlipidemia     Past Medical History  Diagnosis Date  . Hyperlipidemia   . HTN (hypertension)   . Mitral valve prolapse   . GERD (gastroesophageal reflux disease)   . Depression   . Monoclonal (M) protein disease, multiple 'M' protein   . Cervical disc disease   . Migraine headache   . MGUS (monoclonal gammopathy of unknown significance) 03/15/2015  . Osteoarthritis     generalized  . Complication of anesthesia     prolonged sedation  . PONV (postoperative nausea and vomiting)     Past Surgical History  Procedure Laterality Date  . Knee arthroscopy      right knee x 2  . Meniscus repair      right knee x 1  . Cervical disc surgery  6/06  . Tonsilectomy, adenoidectomy, bilateral myringotomy and tubes    . Breast biopsy      negative x 2   . Appendectomy  1974  . Tubal ligation     . Total vaginal hysterectomy  05/2004    adenomyosis, prolapse--ovaries remain  . Bunionectomy      w/hammer toe repair  . Tens      upper and lower back  . Carpal tunnel release Right   . Anterior and posterior repair N/A 10/16/2015    Procedure: ANTERIOR (CYSTOCELE) AND POSTERIOR REPAIR (RECTOCELE);  Surgeon: Nunzio Cobbs, MD;  Location: [facility];  Service: Gynecology;  Laterality: N/A;  . Bladder suspension N/A 10/16/2015    Procedure: TRANSVAGINAL TAPE (TVT) PROCEDURE exact midurethral sling;  Surgeon: Nunzio Cobbs, MD;  Location: [facility];  Service: Gynecology;  Laterality: N/A;  . Cysto N/A 10/16/2015    Procedure: Kathrene Alu;  Surgeon: Nunzio Cobbs, MD;  Location: [facility];  Service: Gynecology;  Laterality: N/A;    Current Outpatient Prescriptions  Medication Sig Dispense Refill  . acetaminophen (TYLENOL) 500 MG tablet Take 1,000 mg by mouth daily as needed for mild pain. Take 1000 mg daily    . ALPRAZolam (XANAX) 0.5 MG tablet Take 0.25-0.5 mg by mouth 2 (two) times daily as needed for anxiety.     Marland Kitchen aspirin EC 81 MG tablet Take 81  mg by mouth daily.    . Cholecalciferol (VITAMIN D-3) 5000 UNITS TABS Take 1 tablet by mouth daily.    . Coenzyme Q10 (CO Q 10) 100 MG CAPS Take 1 capsule by mouth daily.    Marland Kitchen desvenlafaxine (PRISTIQ) 100 MG 24 hr tablet Take 100 mg by mouth daily.    . diazepam (VALIUM) 2 MG tablet Take 1 tablet (2 mg total) by mouth at bedtime as needed. 30 tablet 0  . fenofibrate 160 MG tablet Take 160 mg by mouth daily.    Marland Kitchen ibuprofen (ADVIL,MOTRIN) 600 MG tablet Take 1 tablet (600 mg total) by mouth every 6 (six) hours as needed (mild pain). 30 tablet 0  . losartan (COZAAR) 50 MG tablet Take 50 mg by mouth daily.    . magnesium oxide (MAG-OX) 400 MG tablet Take 400 mg by mouth daily.    . metoprolol succinate (TOPROL-XL) 100 MG 24 hr tablet Take 100 mg by mouth daily. Take with or immediately following a meal.    . Omega-3 Fatty Acids  (FISH OIL PO) Take 700 mg by mouth daily.    Marland Kitchen omeprazole (PRILOSEC) 20 MG capsule Take 20 mg by mouth daily.    . prochlorperazine (COMPAZINE) 10 MG tablet Take 1 tablet (10 mg total) by mouth every 6 (six) hours as needed for nausea or vomiting. 10 tablet 0  . tizanidine (ZANAFLEX) 2 MG capsule Take 2 mg by mouth every 4 (four) hours as needed for muscle spasms.     No current facility-administered medications for this visit.     ALLERGIES: Hydrocodone and Percocet  Family History  Problem Relation Age of Onset  . Heart failure Mother     congestive  . Diabetes Mother   . Hypertension Mother   . Heart disease Father     MI  . Diabetes Father   . Heart attack Father   . Cancer Brother 38    lung cancer  . Cancer Brother     metastasized, unknown origin  . Colon cancer Paternal Grandmother   . Diabetes Brother     Social History   Social History  . Marital Status: Significant Other    Spouse Name: N/A  . Number of Children: 2  . Years of Education: N/A   Occupational History  .      Intake coordinator   Social History Main Topics  . Smoking status: Current Every Day Smoker -- 0.50 packs/day for 35 years    Types: Cigarettes  . Smokeless tobacco: Never Used  . Alcohol Use: No  . Drug Use: No  . Sexual Activity: No     Comment: Hyst   Other Topics Concern  . Not on file   Social History Narrative    ROS:  Pertinent items are noted in HPI.  PHYSICAL EXAMINATION:    Ht 5\' 2"  (1.575 m)  Wt 169 lb (76.658 kg)  BMI 30.90 kg/m2  LMP 03/07/2004 (Approximate)    General appearance: alert, cooperative and appears stated age    ASSESSMENT  Postoperative urinary retention following TVT Exact midurethral sling.   PLAN  Proceed with transection of midurethral sling in operative setting.   Risks, benefits, and alternative reviewed.  Risks include but are not limited to bleeding, infection, damage to surrounding organs, continued voiding dysfunction (which  patient had preop likely due to her prolapse), urinary stress incontinence, reaction to anesthesia, pneumonia, DVT, PE, and need for further reoperation.   I have told the patient I  am sorry that she is experiencing the urinary retention, but that the surgery to transect the sling is really expected to resolve this problem.  She is very appreciative of her care, and wishes to proceed forward with this procedure.  Surgical recovery also reviewed.   An After Visit Summary was printed and given to the patient.

## 2016-01-31 ENCOUNTER — Telehealth: Payer: Self-pay | Admitting: *Deleted

## 2016-01-31 NOTE — Telephone Encounter (Signed)
Call to patient. Discussed surgery date options. Will call patient once date confirmed.

## 2016-02-01 NOTE — Telephone Encounter (Signed)
Surgery scheduled for 02-12-16 at 0730 at [facility]. Call to patient to review instructions. Left message to call back.

## 2016-02-04 NOTE — Telephone Encounter (Signed)
Return call from patient. Advised surgery scheduled for 02-12-16 at 0730 at [facility].Instructed to arrive at 0600 unless hospital directs differently. Surgery instruction sheet reviewed with patient and will provide printed copy at appointment on 02-06-16.   Routing to provider for final review. Patient agreeable to disposition. Will close encounter.

## 2016-02-06 ENCOUNTER — Encounter: Payer: Self-pay | Admitting: Obstetrics and Gynecology

## 2016-02-06 ENCOUNTER — Ambulatory Visit (INDEPENDENT_AMBULATORY_CARE_PROVIDER_SITE_OTHER): Payer: Medicare Other | Admitting: Obstetrics and Gynecology

## 2016-02-06 VITALS — BP 120/76 | HR 64 | Ht 62.0 in | Wt 171.6 lb

## 2016-02-06 DIAGNOSIS — R339 Retention of urine, unspecified: Secondary | ICD-10-CM

## 2016-02-06 NOTE — Patient Instructions (Signed)
Your procedure is scheduled on:  Tuesday, February 12, 2016  Enter through the Main Entrance of [facility] at:  6:00 AM  Pick up the phone at the desk and dial [phone].  Call this number if you have problems the morning of surgery: [phone].  Remember: Do NOT eat food or drink after:  Midnight Monday  Take these medicines the morning of surgery with a SIP OF WATER:  Losartan, Metoprolol, Fenofibrate, Omeprazole, Pristiq, Xanax if needed  Stop taking fish oil at this time.  Do NOT wear jewelry (body piercing), metal hair clips/bobby pins, make-up, or nail polish. Do NOT wear lotions, powders, or perfumes.  You may wear deodorant. Do NOT shave for 48 hours prior to surgery. Do NOT bring valuables to the hospital. Contacts, dentures, or bridgework may not be worn into surgery.  Leave suitcase in car.  After surgery it may be brought to your room.  For patients admitted to the hospital, checkout time is 11:00 AM the day of discharge.  Have a responsible adult drive you home and stay with you for 24 hours after your procedure

## 2016-02-06 NOTE — Progress Notes (Signed)
GYNECOLOGY  VISIT   HPI: 60 y.o.   Single  Caucasian  female   [id number] with Patient's last menstrual period was 03/07/2004 (approximate).   here for surgical consult.   Having urinary retention following anterior and posterior colporrhaphy with TVT Exact midurethral sling/cystoscopy performed 10/16/15.  Is unable to void completely.  Had voiding trial in office on 01/11/16:  Voided 175 cc and PVR 238 cc.  Satisfied with her prior surgery overall, just wants to be able to void more completely.  May need to change the date of the surgery due to work issues of partner. Partner just received notice that she has mandatory training for work she cannot miss.   GYNECOLOGIC HISTORY: Patient's last menstrual period was 03/07/2004 (approximate). Contraception:  Postmenopausal Menopausal hormone therapy:  none Last mammogram:  07/26/2015 BIRADS 1 negative   Last pap smear:   2007 normal        OB History    Gravida Para Term Preterm AB Living   3 2 2  0 1 2   SAB TAB Ectopic Multiple Live Births   1 0 0 0 2         Patient Active Problem List   Diagnosis Date Noted  . Status post surgery 10/16/2015  . MGUS (monoclonal gammopathy of unknown significance) 03/15/2015  . Tobacco abuse 03/14/2014  . Monoclonal (M) protein disease, multiple 'M' protein   . Osteoarthritis   . Mitral valve prolapse   . HTN (hypertension)   . Hyperlipidemia     Past Medical History:  Diagnosis Date  . Cervical disc disease   . Complication of anesthesia    prolonged sedation  . Depression   . GERD (gastroesophageal reflux disease)   . HTN (hypertension)   . Hyperlipidemia   . MGUS (monoclonal gammopathy of unknown significance) 03/15/2015  . Migraine headache   . Mitral valve prolapse   . Monoclonal (M) protein disease, multiple 'M' protein   . Osteoarthritis    generalized  . PONV (postoperative nausea and vomiting)   . Sleep apnea     Past Surgical History:  Procedure Laterality Date  . ANTERIOR  AND POSTERIOR REPAIR N/A 10/16/2015   Procedure: ANTERIOR (CYSTOCELE) AND POSTERIOR REPAIR (RECTOCELE);  Surgeon: Nunzio Cobbs, MD;  Location: [facility];  Service: Gynecology;  Laterality: N/A;  . APPENDECTOMY  1974  . BLADDER SUSPENSION N/A 10/16/2015   Procedure: TRANSVAGINAL TAPE (TVT) PROCEDURE exact midurethral sling;  Surgeon: Nunzio Cobbs, MD;  Location: [facility];  Service: Gynecology;  Laterality: N/A;  . BREAST BIOPSY     negative x 2   . BUNIONECTOMY     w/hammer toe repair  . CARPAL TUNNEL RELEASE Right   . CERVICAL DISC SURGERY  6/06  . CYSTO N/A 10/16/2015   Procedure: Kathrene Alu;  Surgeon: Nunzio Cobbs, MD;  Location: [facility];  Service: Gynecology;  Laterality: N/A;  . KNEE ARTHROSCOPY     right knee x 2  . MENISCUS REPAIR     right knee x 1  . TENS     upper and lower back  . TONSILECTOMY, ADENOIDECTOMY, BILATERAL MYRINGOTOMY AND TUBES    . TOTAL VAGINAL HYSTERECTOMY  05/2004   adenomyosis, prolapse--ovaries remain  . TUBAL LIGATION      Current Outpatient Prescriptions  Medication Sig Dispense Refill  . acetaminophen (TYLENOL) 500 MG tablet Take 1,000 mg by mouth daily as needed for mild pain. Take 1000 mg daily    .  ALPRAZolam (XANAX) 0.5 MG tablet Take 0.25-0.5 mg by mouth 2 (two) times daily as needed for anxiety.     Marland Kitchen aspirin EC 81 MG tablet Take 81 mg by mouth daily.    . Cholecalciferol (VITAMIN D-3) 5000 UNITS TABS Take 1 tablet by mouth daily.    . Coenzyme Q10 300 MG CAPS Take 1 capsule by mouth daily.    Marland Kitchen desvenlafaxine (PRISTIQ) 100 MG 24 hr tablet Take 100 mg by mouth daily.    . diazepam (VALIUM) 2 MG tablet Take 1 tablet (2 mg total) by mouth at bedtime as needed. 30 tablet 0  . fenofibrate 160 MG tablet Take 160 mg by mouth daily.    Marland Kitchen ibuprofen (ADVIL,MOTRIN) 600 MG tablet Take 1 tablet (600 mg total) by mouth every 6 (six) hours as needed (mild pain). 30 tablet 0  . losartan (COZAAR) 50 MG tablet Take 50 mg by mouth daily.     . magnesium oxide (MAG-OX) 400 MG tablet Take 400 mg by mouth daily.    . metoprolol succinate (TOPROL-XL) 100 MG 24 hr tablet Take 100 mg by mouth daily. Take with or immediately following a meal.    . Omega-3 Fatty Acids (FISH OIL PO) Take 500 mg by mouth daily.     Marland Kitchen omeprazole (PRILOSEC) 20 MG capsule Take 20 mg by mouth daily.    . prochlorperazine (COMPAZINE) 10 MG tablet Take 1 tablet (10 mg total) by mouth every 6 (six) hours as needed for nausea or vomiting. 10 tablet 0  . tizanidine (ZANAFLEX) 2 MG capsule Take 2 mg by mouth every 4 (four) hours as needed for muscle spasms.     No current facility-administered medications for this visit.      ALLERGIES: Hydrocodone and Percocet [oxycodone-acetaminophen]  Family History  Problem Relation Age of Onset  . Heart failure Mother     congestive  . Diabetes Mother   . Hypertension Mother   . Heart disease Father     MI  . Diabetes Father   . Heart attack Father   . Cancer Brother 4    lung cancer  . Cancer Brother     metastasized, unknown origin  . Colon cancer Paternal Grandmother   . Diabetes Brother     Social History   Social History  . Marital status: Single    Spouse name: N/A  . Number of children: 2  . Years of education: N/A   Occupational History  .      Intake coordinator   Social History Main Topics  . Smoking status: Current Every Day Smoker    Packs/day: 0.50    Years: 35.00    Types: Cigarettes  . Smokeless tobacco: Never Used  . Alcohol use No  . Drug use: No  . Sexual activity: No     Comment: Hyst   Other Topics Concern  . Not on file   Social History Narrative  . No narrative on file    ROS:  Pertinent items are noted in HPI.  PHYSICAL EXAMINATION:    BP 120/76 (BP Location: Right Arm, Patient Position: Sitting, Cuff Size: Normal)   Pulse 64   Ht 5\' 2"  (1.575 m)   Wt 171 lb 9.6 oz (77.8 kg)   LMP 03/07/2004 (Approximate)   BMI 31.39 kg/m     General appearance: alert,  cooperative and appears stated age.  Wearing TENS unit across her shoulders. Head: Normocephalic, without obvious abnormality, atraumatic Neck: no adenopathy, supple, symmetrical,  trachea midline and thyroid normal to inspection and palpation Lungs: clear to auscultation bilaterally Breasts: normal appearance, no masses or tenderness, No nipple retraction or dimpling, No nipple discharge or bleeding, No axillary or supraclavicular adenopathy Heart: regular rate and rhythm Abdomen: soft, non-tender, no masses,  no organomegaly Extremities: extremities normal, atraumatic, no cyanosis or edema Skin: Skin color, texture, turgor normal. No rashes or lesions Lymph nodes: Cervical, supraclavicular, and axillary nodes normal. No abnormal inguinal nodes palpated Neurologic: Grossly normal  Pelvic: External genitalia:  no lesions              Urethra:  normal appearing urethra with no masses, tenderness or lesions.  No sling exposure.              Bartholins and Skenes: normal                 Vagina: normal appearing vagina with normal color and discharge, no lesions              Cervix:  absent                Bimanual Exam:  Uterus:   absent              Adnexa: no mass, fullness, tenderness            Chaperone was present for exam.  ASSESSMENT  Urinary retention following midurethral sling.  No evidence of recurrence of prolapse.  PLAN  Proceed with lysis of midurethral sling and cystoscopy.  Risks, benefits, and alternatives reviewed with the patient who wishes to proceed. Will adjust the date of surgery.     An After Visit Summary was printed and given to the patient.  __15____ minutes face to face time of which over 50% was spent in counseling.

## 2016-02-07 ENCOUNTER — Inpatient Hospital Stay (HOSPITAL_COMMUNITY)
Admission: RE | Admit: 2016-02-07 | Discharge: 2016-02-07 | Disposition: A | Discharge status: Home / Self Care | Payer: Medicare Other | Source: Ambulatory Visit

## 2016-02-07 ENCOUNTER — Telehealth: Payer: Self-pay | Admitting: *Deleted

## 2016-02-07 NOTE — Telephone Encounter (Signed)
leftt for patient to call- need to inform her the time of her surgery on 02-26-16. Patient needs to arrive at [facility] @ 6am for a 7:30 surgery time -eh

## 2016-02-08 NOTE — Telephone Encounter (Signed)
Patient returned call placed by [facility].  Margaretha Sheffield is out of the office, I have conveyed to the following information to the patient as noted by Margaretha Sheffield:  Surgery is scheduled 02/26/16 at [facility]. She will need to arrive at 6:00 AM for a 7:30 surgery time. Patient understood and is agreeable to this information. Patient states she has her surgery packet from our office with instructions. Patient has no further questions.  Routing to [place] for review

## 2016-02-12 NOTE — Telephone Encounter (Signed)
Routing to provider for final review. Patient agreeable to disposition. Will close encounter.     

## 2016-02-13 ENCOUNTER — Encounter (HOSPITAL_COMMUNITY): Payer: Self-pay

## 2016-02-13 ENCOUNTER — Encounter (HOSPITAL_COMMUNITY)
Admission: RE | Admit: 2016-02-13 | Discharge: 2016-02-13 | Disposition: A | Discharge status: Home / Self Care | Payer: Medicare Other | Source: Ambulatory Visit | Attending: Obstetrics and Gynecology | Admitting: Obstetrics and Gynecology

## 2016-02-13 DIAGNOSIS — Z01812 Encounter for preprocedural laboratory examination: Secondary | ICD-10-CM | POA: Insufficient documentation

## 2016-02-13 LAB — CBC
HEMATOCRIT: 38 % (ref 36.0–46.0)
HEMOGLOBIN: 12.5 g/dL (ref 12.0–15.0)
MCH: 29 pg (ref 26.0–34.0)
MCHC: 32.9 g/dL (ref 30.0–36.0)
MCV: 88.2 fL (ref 78.0–100.0)
Platelets: 395 10*3/uL (ref 150–400)
RBC: 4.31 MIL/uL (ref 3.87–5.11)
RDW: 13 % (ref 11.5–15.5)
WBC: 8.7 10*3/uL (ref 4.0–10.5)

## 2016-02-13 LAB — BASIC METABOLIC PANEL
ANION GAP: 8 (ref 5–15)
BUN: 12 mg/dL (ref 6–20)
CALCIUM: 9.4 mg/dL (ref 8.9–10.3)
CO2: 27 mmol/L (ref 22–32)
Chloride: 102 mmol/L (ref 101–111)
Creatinine, Ser: 0.81 mg/dL (ref 0.44–1.00)
Glucose, Bld: 113 mg/dL — ABNORMAL HIGH (ref 65–99)
POTASSIUM: 4.2 mmol/L (ref 3.5–5.1)
SODIUM: 137 mmol/L (ref 135–145)

## 2016-02-13 NOTE — Patient Instructions (Signed)
Your procedure is scheduled on:02/26/16  Enter through the Main Entrance at :[place] up desk phone and dial [phone] and inform us of your arrival.  Please call [phone] if you have any problems the morning of surgery.  Remember: Do not eat food or drink liquids, including water, after midnight:Monday   You may brush your teeth the morning of surgery.  Take these meds the morning of surgery with a sip of water: Xanax, Pristiq,Omeprazole, BP meds  DO NOT wear jewelry, eye make-up, lipstick,body lotion, or dark fingernail polish.  (Polished toes are ok) You may wear deodorant.  If you are to be admitted after surgery, leave suitcase in car until your room has been assigned. Patients discharged on the day of surgery will not be allowed to drive home. Wear loose fitting, comfortable clothes for your ride home.

## 2016-02-14 ENCOUNTER — Other Ambulatory Visit: Payer: Self-pay | Admitting: Obstetrics and Gynecology

## 2016-02-14 DIAGNOSIS — R739 Hyperglycemia, unspecified: Secondary | ICD-10-CM

## 2016-02-18 ENCOUNTER — Ambulatory Visit: Payer: Medicare Other | Admitting: Obstetrics and Gynecology

## 2016-02-19 ENCOUNTER — Telehealth: Payer: Self-pay | Admitting: *Deleted

## 2016-02-19 NOTE — Telephone Encounter (Signed)
-----   Message from Nunzio Cobbs, MD sent at 02/14/2016  5:49 AM EDT ----- Please report labs from hospital preop to patient.  Her glucose is running just a little high.  This may be because it was not a fasting level.  I do recommend she have a follow up hemoglobin A1C to look at her blood sugar in greater detail. We can do this when she returns for her post op visit.  It does not need to be done fasting.

## 2016-02-19 NOTE — Telephone Encounter (Signed)
I would like to have more information about her UTI.  Results of culture, abx used, and when she is anticipated to finish the abx.   Cc- Lamont Snowball

## 2016-02-19 NOTE — Telephone Encounter (Signed)
Spoke with patient and went over results. She wants to get the hemoglobin A1c at her post opt. She did want Dr. [name] to know that she saw her PCP last week for a UTI and is on ABX.  Sending to [facility] for reviewd -eh

## 2016-02-19 NOTE — Telephone Encounter (Signed)
Left message to call regarding lab results -eh 

## 2016-02-19 NOTE — Telephone Encounter (Signed)
Call to patient. Patient states that she was seen at [facility] on 02/14/2016 by Marilynne Drivers, NP. She was prescribed Cipro 500 mg bid. Reports she will take her last pill Friday morning. Reports feeling better since starting antibiotic. Advised we will need the records from her visit on 02/14/2016 for Dr.Silva's review prior to surgery. Patient is agreeable and aware I will contact [facility] for records.   Spoke with medical records at [facility]. Patient OV notes and labs to be faxed to the office today.

## 2016-02-22 ENCOUNTER — Other Ambulatory Visit: Payer: Self-pay | Admitting: Obstetrics & Gynecology

## 2016-02-22 ENCOUNTER — Ambulatory Visit (INDEPENDENT_AMBULATORY_CARE_PROVIDER_SITE_OTHER): Payer: Medicare Other | Admitting: *Deleted

## 2016-02-22 ENCOUNTER — Telehealth: Payer: Self-pay

## 2016-02-22 DIAGNOSIS — N3000 Acute cystitis without hematuria: Secondary | ICD-10-CM

## 2016-02-22 NOTE — Telephone Encounter (Signed)
Left message to call [place] at [phone].   Need to advised patient we have received her results from her visit with [facility] on 02/14/2016. Urine culture showed E.Coli UTI. She is currently taking Cipro 500 mg bid. She was due to take her last tablet this morning. The patient is scheduled for a transvaginal tape removal/lysis of mid urethral sling on 02/26/2016. I have review results with Dr.Miller as Dr.Silva is out of the office today. Per Dr.Miller the patient will need to be seen for a urine culture in the office today.

## 2016-02-22 NOTE — Telephone Encounter (Signed)
Contacted [employer] as records have not been received for patient. Requested OV note and lab results from patient's visit on 02/14/2016 be faxed to our office for Dr.Silva's review. Records to be faxed over at this time.

## 2016-02-22 NOTE — Telephone Encounter (Signed)
Medical records received from [facility]. Reviewed results with Dr.Miller as Dr.Silva is out of the office today. Please see telephone note dated 02/22/2016.  Routing to provider for final review. Patient agreeable to disposition. Will close encounter.

## 2016-02-22 NOTE — Telephone Encounter (Signed)
Call again to [facility]. Left a detailed message for the records department stating records have not yet been received. Requesting these records be faxed to our office for Dr.Silva's review. Advised of importance of the need for these records to be received.

## 2016-02-22 NOTE — Telephone Encounter (Signed)
Spoke with patient. Advised I have reviewed her results with Dr.Miller as Dr.Silva is out of the office today. Patient will need to be seen for urine culture in the office today so that this will return over the weekend before her surgery is scheduled next week. Needs to ensure UTI has cleared. Patient has completed her antibiotics and is agreeable. Patient will head to the office now to provide urine culture. Will be placed on the nurse schedule when she arrives. Aware she needs to be at the office by 4 pm. [employer] notified.

## 2016-02-22 NOTE — Progress Notes (Signed)
Patient here for TOC urine culture. Per phone message, patient had E. Coli UTI on 8/10 /17 at [facility].   Patient feeling better and no complaints, denies any symptoms.  Took her last pill of abx yesterday morning, 02/21/16.   Routed to provider and encounter closed.

## 2016-02-22 NOTE — Telephone Encounter (Signed)
[facility] and let them know we received office note from 02-14-16 but did not receive urine culture report. Left message on medical record line to please fax me this report today.

## 2016-02-22 NOTE — Telephone Encounter (Signed)
Patient was seen in the office today 02/22/2016 at 2 pm for urine culture. Patient results have been scanned into EP[facility] for Dr.Silva's review.  Cc: Dr.Silva  Routing to covering provider for final review. Patient agreeable to disposition. Will close encounter.

## 2016-02-24 LAB — URINE CULTURE: Organism ID, Bacteria: 10000

## 2016-02-24 NOTE — Progress Notes (Signed)
Encounter reviewed by Dr. Brook Amundson C. Silva.  

## 2016-02-25 MED ORDER — DEXTROSE 5 % IV SOLN
2.0000 g | INTRAVENOUS | Status: AC
  Administered 2016-02-26: 2 g via INTRAVENOUS
  Filled 2016-02-25: qty 2

## 2016-02-25 NOTE — Telephone Encounter (Signed)
Spoke with patient. Advised of message as seen below from [place]. She is agreeable and verbalizes understanding.  Routing to provider for final review. Patient agreeable to disposition. Will close encounter.

## 2016-02-25 NOTE — Telephone Encounter (Signed)
Please inform patient of her negative urine culture test of cure from 02/22/16. OK to proceed with surgery tomorrow.  I will see her then.

## 2016-02-25 NOTE — Telephone Encounter (Signed)
Left message to call [place] at [phone]. 

## 2016-02-26 ENCOUNTER — Ambulatory Visit (HOSPITAL_COMMUNITY): Payer: Medicare Other | Admitting: Anesthesiology

## 2016-02-26 ENCOUNTER — Encounter (HOSPITAL_COMMUNITY): Payer: Self-pay

## 2016-02-26 ENCOUNTER — Ambulatory Visit (HOSPITAL_COMMUNITY)
Admission: RE | Admit: 2016-02-26 | Discharge: 2016-02-26 | Disposition: A | Discharge status: Home / Self Care | Payer: Medicare Other | Source: Ambulatory Visit | Attending: Obstetrics and Gynecology | Admitting: Obstetrics and Gynecology

## 2016-02-26 ENCOUNTER — Encounter (HOSPITAL_COMMUNITY)
Admission: RE | Disposition: A | Discharge status: Home / Self Care | Payer: Self-pay | Source: Ambulatory Visit | Attending: Obstetrics and Gynecology

## 2016-02-26 DIAGNOSIS — N9989 Other postprocedural complications and disorders of genitourinary system: Secondary | ICD-10-CM | POA: Diagnosis not present

## 2016-02-26 DIAGNOSIS — Z7982 Long term (current) use of aspirin: Secondary | ICD-10-CM | POA: Insufficient documentation

## 2016-02-26 DIAGNOSIS — R338 Other retention of urine: Secondary | ICD-10-CM | POA: Diagnosis present

## 2016-02-26 DIAGNOSIS — I341 Nonrheumatic mitral (valve) prolapse: Secondary | ICD-10-CM | POA: Insufficient documentation

## 2016-02-26 DIAGNOSIS — K219 Gastro-esophageal reflux disease without esophagitis: Secondary | ICD-10-CM | POA: Insufficient documentation

## 2016-02-26 DIAGNOSIS — N8110 Cystocele, unspecified: Secondary | ICD-10-CM | POA: Insufficient documentation

## 2016-02-26 DIAGNOSIS — F17210 Nicotine dependence, cigarettes, uncomplicated: Secondary | ICD-10-CM | POA: Insufficient documentation

## 2016-02-26 DIAGNOSIS — R339 Retention of urine, unspecified: Secondary | ICD-10-CM

## 2016-02-26 DIAGNOSIS — I10 Essential (primary) hypertension: Secondary | ICD-10-CM | POA: Diagnosis not present

## 2016-02-26 HISTORY — PX: TRANSVAGINAL TAPE (TVT) REMOVAL: SHX6154

## 2016-02-26 HISTORY — PX: CYSTOSCOPY: SHX5120

## 2016-02-26 SURGERY — TRANSVAGINAL TAPE (TVT) REMOVAL
Anesthesia: General

## 2016-02-26 MED ORDER — EPHEDRINE 5 MG/ML INJ
INTRAVENOUS | Status: AC
  Filled 2016-02-26: qty 10

## 2016-02-26 MED ORDER — GLYCOPYRROLATE 0.2 MG/ML IJ SOLN
INTRAMUSCULAR | Status: AC
  Filled 2016-02-26: qty 1

## 2016-02-26 MED ORDER — PHENYLEPHRINE 40 MCG/ML (10ML) SYRINGE FOR IV PUSH (FOR BLOOD PRESSURE SUPPORT)
PREFILLED_SYRINGE | INTRAVENOUS | Status: AC
  Filled 2016-02-26: qty 10

## 2016-02-26 MED ORDER — ONDANSETRON HCL 4 MG/2ML IJ SOLN: 4.0000 mg | Freq: Once | INTRAMUSCULAR | Status: DC | PRN

## 2016-02-26 MED ORDER — ONDANSETRON HCL 4 MG/2ML IJ SOLN
INTRAMUSCULAR | Status: AC
  Filled 2016-02-26: qty 2

## 2016-02-26 MED ORDER — GLYCOPYRROLATE 0.2 MG/ML IJ SOLN
INTRAMUSCULAR | Status: DC | PRN
  Administered 2016-02-26: 0.2 mg via INTRAVENOUS

## 2016-02-26 MED ORDER — KETOROLAC TROMETHAMINE 30 MG/ML IJ SOLN
INTRAMUSCULAR | Status: AC
  Filled 2016-02-26: qty 1

## 2016-02-26 MED ORDER — ONDANSETRON HCL 4 MG/2ML IJ SOLN
INTRAMUSCULAR | Status: DC | PRN
  Administered 2016-02-26: 4 mg via INTRAVENOUS

## 2016-02-26 MED ORDER — PROPOFOL 10 MG/ML IV BOLUS
INTRAVENOUS | Status: DC | PRN
  Administered 2016-02-26: 200 mg via INTRAVENOUS

## 2016-02-26 MED ORDER — LACTATED RINGERS IV SOLN: INTRAVENOUS | Status: DC

## 2016-02-26 MED ORDER — EPHEDRINE SULFATE 50 MG/ML IJ SOLN
INTRAMUSCULAR | Status: DC | PRN
Start: 2016-02-26 — End: 2016-02-26
  Administered 2016-02-26 (×2): 10 mg via INTRAVENOUS

## 2016-02-26 MED ORDER — LIDOCAINE-EPINEPHRINE 1 %-1:100000 IJ SOLN
INTRAMUSCULAR | Status: AC
  Filled 2016-02-26: qty 1

## 2016-02-26 MED ORDER — KETOROLAC TROMETHAMINE 30 MG/ML IJ SOLN
INTRAMUSCULAR | Status: DC | PRN
  Administered 2016-02-26: 30 mg via INTRAVENOUS

## 2016-02-26 MED ORDER — MIDAZOLAM HCL 2 MG/2ML IJ SOLN
INTRAMUSCULAR | Status: AC
  Filled 2016-02-26: qty 2

## 2016-02-26 MED ORDER — MEPERIDINE HCL 25 MG/ML IJ SOLN: 6.2500 mg | INTRAMUSCULAR | Status: DC | PRN

## 2016-02-26 MED ORDER — LIDOCAINE HCL (PF) 1 % IJ SOLN
INTRAMUSCULAR | Status: AC
  Filled 2016-02-26: qty 5

## 2016-02-26 MED ORDER — FENTANYL CITRATE (PF) 100 MCG/2ML IJ SOLN
INTRAMUSCULAR | Status: DC | PRN
  Administered 2016-02-26 (×2): 50 ug via INTRAVENOUS

## 2016-02-26 MED ORDER — MIDAZOLAM HCL 5 MG/5ML IJ SOLN
INTRAMUSCULAR | Status: DC | PRN
  Administered 2016-02-26: 2 mg via INTRAVENOUS

## 2016-02-26 MED ORDER — DEXAMETHASONE SODIUM PHOSPHATE 4 MG/ML IJ SOLN
INTRAMUSCULAR | Status: DC | PRN
Start: 2016-02-26 — End: 2016-02-26
  Administered 2016-02-26: 4 mg via INTRAVENOUS

## 2016-02-26 MED ORDER — STERILE WATER FOR IRRIGATION IR SOLN
Status: DC | PRN
  Administered 2016-02-26: 1000 mL

## 2016-02-26 MED ORDER — PHENYLEPHRINE HCL 10 MG/ML IJ SOLN
INTRAMUSCULAR | Status: DC | PRN
  Administered 2016-02-26 (×4): 40 ug via INTRAVENOUS

## 2016-02-26 MED ORDER — PROPOFOL 10 MG/ML IV BOLUS
INTRAVENOUS | Status: AC
  Filled 2016-02-26: qty 20

## 2016-02-26 MED ORDER — DEXAMETHASONE SODIUM PHOSPHATE 4 MG/ML IJ SOLN
INTRAMUSCULAR | Status: AC
  Filled 2016-02-26: qty 1

## 2016-02-26 MED ORDER — SCOPOLAMINE 1 MG/3DAYS TD PT72
MEDICATED_PATCH | TRANSDERMAL | Status: AC
  Filled 2016-02-26: qty 1

## 2016-02-26 MED ORDER — LIDOCAINE-EPINEPHRINE 1 %-1:100000 IJ SOLN
INTRAMUSCULAR | Status: DC | PRN
  Administered 2016-02-26: 8 mL

## 2016-02-26 MED ORDER — LACTATED RINGERS IV SOLN
INTRAVENOUS | Status: DC
  Administered 2016-02-26 (×3): via INTRAVENOUS

## 2016-02-26 MED ORDER — SCOPOLAMINE 1 MG/3DAYS TD PT72
1.0000 | MEDICATED_PATCH | Freq: Once | TRANSDERMAL | Status: DC
Start: 2016-02-26 — End: 2016-02-26
  Administered 2016-02-26: 1.5 mg via TRANSDERMAL

## 2016-02-26 MED ORDER — FENTANYL CITRATE (PF) 100 MCG/2ML IJ SOLN
INTRAMUSCULAR | Status: AC
  Filled 2016-02-26: qty 2

## 2016-02-26 MED ORDER — LIDOCAINE HCL (CARDIAC) 20 MG/ML IV SOLN
INTRAVENOUS | Status: DC | PRN
  Administered 2016-02-26: 50 mg via INTRAVENOUS

## 2016-02-26 SURGICAL SUPPLY — 27 items
CANISTER SUCT 3000ML (MISCELLANEOUS) ×3 IMPLANT
CLEANER TIP ELECTROSURG 2X2 (MISCELLANEOUS) ×2 IMPLANT
CLOTH BEACON ORANGE TIMEOUT ST (SAFETY) ×3 IMPLANT
COUNTER NEEDLE 1200 MAGNETIC (NEEDLE) ×2 IMPLANT
DECANTER SPIKE VIAL GLASS SM (MISCELLANEOUS) ×2 IMPLANT
DRAPE SHEET LG 3/4 BI-LAMINATE (DRAPES) ×4 IMPLANT
GLOVE BIO SURGEON STRL SZ 6.5 (GLOVE) ×2 IMPLANT
GLOVE BIO SURGEONS STRL SZ 6.5 (GLOVE) ×1
GLOVE BIOGEL PI IND STRL 7.0 (GLOVE) ×1 IMPLANT
GLOVE BIOGEL PI INDICATOR 7.0 (GLOVE) ×2
GOWN STRL REUS W/TWL LRG LVL3 (GOWN DISPOSABLE) ×12 IMPLANT
NEEDLE HYPO 22GX1.5 SAFETY (NEEDLE) ×3 IMPLANT
NS IRRIG 1000ML POUR BTL (IV SOLUTION) ×3 IMPLANT
PACK VAGINAL MINOR WOMEN LF (CUSTOM PROCEDURE TRAY) ×3 IMPLANT
PACK VAGINAL WOMENS (CUSTOM PROCEDURE TRAY) ×1 IMPLANT
PENCIL BUTTON HOLSTER BLD 10FT (ELECTRODE) ×2 IMPLANT
SET CYSTO W/LG BORE CLAMP LF (SET/KITS/TRAYS/PACK) ×3 IMPLANT
SUT PDS AB 2-0 CT1 27 (SUTURE) ×2 IMPLANT
SUT VIC AB 2-0 CT2 27 (SUTURE) IMPLANT
SUT VIC AB 2-0 SH 27 (SUTURE) ×3
SUT VIC AB 2-0 SH 27XBRD (SUTURE) IMPLANT
TOWEL OR 17X24 6PK STRL BLUE (TOWEL DISPOSABLE) ×6 IMPLANT
TRAY FOLEY CATH SILVER 14FR (SET/KITS/TRAYS/PACK) IMPLANT
TRAY FOLEY CATH SILVER 16FR (SET/KITS/TRAYS/PACK) IMPLANT
TUBING SUCTION BULK 100 FT (MISCELLANEOUS) ×2 IMPLANT
WATER STERILE IRR 1000ML POUR (IV SOLUTION) ×3 IMPLANT
YANKAUER SUCT BULB TIP NO VENT (SUCTIONS) ×2 IMPLANT

## 2016-02-26 NOTE — Op Note (Signed)
[name], [name]           ACCOUNT NO.:  [id number]  MEDICAL RECORD NO.:  [id number]  LOCATION:                                FACILITY:  [facility]  PHYSICIAN:  Lenard Galloway, M.D.   DATE OF BIRTH:  1955/09/26  DATE OF PROCEDURE:  02/26/2016 DATE OF DISCHARGE:  02/26/2016                              OPERATIVE REPORT   PREOPERATIVE DIAGNOSIS:  Urinary retention.  POSTOPERATIVE DIAGNOSIS:  Urinary retention, cystocele.  PROCEDURE:  Transection of midurethral sling, cystoscopy, anterior colporrhaphy.  SURGEON:  Lenard Galloway, M.D.  ASSISTANT:  Sumner Boast, MD.  ANESTHESIA:  LMA, local with 1% lidocaine with epinephrine 1:100,000.  IV FLUIDS:  1000 mL Ringer's lactate.  ESTIMATED BLOOD LOSS:  25 mL.  URINE OUTPUT:  450 mL.  COMPLICATIONS:  None.  INDICATIONS FOR THE PROCEDURE:  The patient is a 60 year old gravida 3, para 2-0-1-2 Caucasian female, status post anterior and posterior colporrhaphy with a TVT Exact mid urethral sling and cystoscopy on October 16, 2015, who presents with urinary retention.  The patient has been able to void postoperatively, but she does have residual volumes requiring her to empty frequently.  The patient has been followed with expectant management and a trial of Valium therapy, which did not improve her retention symptoms.  She has had an office voiding trial on January 11, 2016 where she voided 175 mL and had a postvoid residual of 238 mL.  The patient has been satisfied with her surgery overall, but wishes to avoid more completely.  The plan has been made to proceed with transection of her mid urethral sling along with cystoscopy.  Risks, benefits, and alternatives are reviewed with the patient who wishes to proceed.  FINDINGS:  Examination under anesthesia revealed a small high cystocele. The patient has a foreshortened vagina.  Cystoscopy documents the ureters to be patent bilaterally.  There was no evidence of a foreign body in the  bladder or the urethra.  The bladder and urethra are noted to be intact.  The bladder was visualized throughout 360 degrees including the bladder dome and trigone.  SPECIMENS:  None.  DESCRIPTION OF PROCEDURE:  The patient was reidentified in the preoperative hold area.  The patient was escorted to the operating room where she received an LMA anesthetic.  The patient did receive TED hose and PAS stockings for DVT prophylaxis, and she received cefotetan 2 g IV for antibiotic prophylaxis.  The patient was placed in the dorsal lithotomy position with the Allen stirrups.  The lower abdomen, vagina, and perineum were sterilely prepped, and she was sterilely draped.  An examination under anesthesia was performed.  The patient was catheterized of urine with a red rubber catheter.  Cystoscopy was performed at this time.  The left ureter was noted to be patent, and the right ureter did not demonstrate patency at this time.  There was no evidence of a foreign body in the bladder.  The urethra and the bladder were visualized throughout 360 degrees including the bladder dome and trigone.  The bladder was drained of cystoscopic fluid.  A 14-French Foley catheter was then placed inside the bladder.  A weighted speculum was placed inside the vagina and  Allis clamps were used to grasp the anterior vaginal wall mucosa in the midline over the course of 4 cm.  The mucosa was injected locally with 1% lidocaine with epinephrine 1:100,000.  The mucosa was incised vertically with a scalpel.  Sharp and blunt dissection were used to dissect the subvaginal tissue away from the vaginal mucosa bilaterally.  A small cystocele was encountered during the course of this dissection.  2 sutures of 2-0 PDS were used to reduce the cystocele.  The region of the sling could be palpable palpated along the midurethra at this time.  Dissection was performed down to the level of the sling. The upper colporrhaphy suture was  removed at this time to allow for better access to the mid urethral sling.  A tonsil was carefully placed just behind the midurethral sling, which was then transected sharply with a scalpel.  Care was taken to avoid contact with the underlying urethra.  There was immediate retraction of the midurethral sling, both to the right and to the left.  This allowed a gap of approximately 4-5 mm between the edges of the sling.  No sling was excised.  The Foley catheter which had been placed during the course of that dissection was removed at this time, and repeat cystoscopy was performed and the findings are as noted above.  D10W was used to assist with visualization of the ureters for this repeat cystoscopy.  The bladder was drained of all cystoscopic fluid.  The Foley catheter was left out. The surgical field was examined.  There was some minor bleeding over the bladder on the patient's left-hand side down toward the vaginal apex and this responded well to monopolar cautery.  The vaginal mucosa was then closed with a running locked suture of 2-0 Vicryl.  The weighted speculum was removed.  The patient was then cleansed of any remaining Betadine.  She was awakened and escorted to the recovery room in stable condition.  There were no complications to the procedure.  All needle, instrument, and lap counts were correct.     Lenard Galloway, M.D.   ______________________________ Lenard Galloway, M.D.    BES/MEDQ  D:  02/26/2016  T:  02/26/2016  Job:  [id number]

## 2016-02-26 NOTE — Anesthesia Procedure Notes (Signed)
Procedure Name: LMA Insertion Date/Time: 02/26/2016 7:31 AM Performed by: Bufford Spikes Pre-anesthesia Checklist: Patient identified, Emergency Drugs available, Suction available, Patient being monitored and Timeout performed Patient Re-evaluated:Patient Re-evaluated prior to inductionOxygen Delivery Method: Circle system utilized Preoxygenation: Pre-oxygenation with 100% oxygen Intubation Type: IV induction Ventilation: Mask ventilation without difficulty LMA: LMA inserted LMA Size: 4.0 Tube type: Oral Number of attempts: 1 Tube secured with: Tape Dental Injury: Teeth and Oropharynx as per pre-operative assessment

## 2016-02-26 NOTE — H&P (Signed)
Office Visit   02/06/2016 [place], MD  Obstetrics and Gynecology   Urinary retention  Dx   Surgical Consult ; Referred by Harlan Stains, MD  Reason for Visit   Additional Documentation   Vitals:   BP 120/76 (BP Location: Right Arm, Patient Position: Sitting, Cuff Size: Normal)   Pulse 64   Ht 5\' 2"  (1.575 m)   Wt 171 lb 9.6 oz (77.8 kg)   LMP 03/07/2004 (Approximate)   BMI 31.39 kg/m   BSA 1.84 m   Flowsheets:   Custom Formula Data,   MEWS Score,   Anthropometrics,   Infectious Disease Screening     SmartForms:   CHL AMB SEXUALITY AND GENDER IDENTITY     Encounter Info:   Billing Info,   History,   Allergies,   Detailed Report     All Notes   Progress Notes by Nunzio Cobbs, MD at 02/06/2016 10:00 AM   Author: Nunzio Cobbs, MD Author Type: Physician Filed: 02/07/2016 5:42 AM  Note Status: Signed Cosign: Cosign Not Required Encounter Date: 02/06/2016  Editor: Nunzio Cobbs, MD (Physician)  Prior Versions: 1. Lowella Fairy, CMA (Certified Medical Assistant) at 02/06/2016 10:16 AM - Sign at close encounter    GYNECOLOGY  VISIT   HPI: 60 y.o.   Single  Caucasian  female   [id number] with Patient's last menstrual period was 03/07/2004 (approximate).   here for surgical consult.   Having urinary retention following anterior and posterior colporrhaphy with TVT Exact midurethral sling/cystoscopy performed 10/16/15.  Is unable to void completely.  Had voiding trial in office on 01/11/16:  Voided 175 cc and PVR 238 cc.  Satisfied with her prior surgery overall, just wants to be able to void more completely.  May need to change the date of the surgery due to work issues of partner. Partner just received notice that she has mandatory training for work she cannot miss.   GYNECOLOGIC HISTORY: Patient's last menstrual period was 03/07/2004 (approximate). Contraception:  Postmenopausal Menopausal  hormone therapy:  none Last mammogram:  07/26/2015 BIRADS 1 negative  Last pap smear:   2007 normal                OB History    Gravida Para Term Preterm AB Living   3 2 2  0 1 2   SAB TAB Ectopic Multiple Live Births   1 0 0 0 2             Patient Active Problem List   Diagnosis Date Noted  . Status post surgery 10/16/2015  . MGUS (monoclonal gammopathy of unknown significance) 03/15/2015  . Tobacco abuse 03/14/2014  . Monoclonal (M) protein disease, multiple 'M' protein   . Osteoarthritis   . Mitral valve prolapse   . HTN (hypertension)   . Hyperlipidemia         Past Medical History:  Diagnosis Date  . Cervical disc disease   . Complication of anesthesia    prolonged sedation  . Depression   . GERD (gastroesophageal reflux disease)   . HTN (hypertension)   . Hyperlipidemia   . MGUS (monoclonal gammopathy of unknown significance) 03/15/2015  . Migraine headache   . Mitral valve prolapse   . Monoclonal (M) protein disease, multiple 'M' protein   . Osteoarthritis    generalized  . PONV (postoperative nausea and vomiting)   . Sleep apnea  Past Surgical History:  Procedure Laterality Date  . ANTERIOR AND POSTERIOR REPAIR N/A 10/16/2015   Procedure: ANTERIOR (CYSTOCELE) AND POSTERIOR REPAIR (RECTOCELE);  Surgeon: Nunzio Cobbs, MD;  Location: [facility];  Service: Gynecology;  Laterality: N/A;  . APPENDECTOMY  1974  . BLADDER SUSPENSION N/A 10/16/2015   Procedure: TRANSVAGINAL TAPE (TVT) PROCEDURE exact midurethral sling;  Surgeon: Nunzio Cobbs, MD;  Location: [facility];  Service: Gynecology;  Laterality: N/A;  . BREAST BIOPSY     negative x 2   . BUNIONECTOMY     w/hammer toe repair  . CARPAL TUNNEL RELEASE Right   . CERVICAL DISC SURGERY  6/06  . CYSTO N/A 10/16/2015   Procedure: Kathrene Alu;  Surgeon: Nunzio Cobbs, MD;  Location: [facility];  Service: Gynecology;  Laterality: N/A;  . KNEE  ARTHROSCOPY     right knee x 2  . MENISCUS REPAIR     right knee x 1  . TENS     upper and lower back  . TONSILECTOMY, ADENOIDECTOMY, BILATERAL MYRINGOTOMY AND TUBES    . TOTAL VAGINAL HYSTERECTOMY  05/2004   adenomyosis, prolapse--ovaries remain  . TUBAL LIGATION            Current Outpatient Prescriptions  Medication Sig Dispense Refill  . acetaminophen (TYLENOL) 500 MG tablet Take 1,000 mg by mouth daily as needed for mild pain. Take 1000 mg daily    . ALPRAZolam (XANAX) 0.5 MG tablet Take 0.25-0.5 mg by mouth 2 (two) times daily as needed for anxiety.     Marland Kitchen aspirin EC 81 MG tablet Take 81 mg by mouth daily.    . Cholecalciferol (VITAMIN D-3) 5000 UNITS TABS Take 1 tablet by mouth daily.    . Coenzyme Q10 300 MG CAPS Take 1 capsule by mouth daily.    Marland Kitchen desvenlafaxine (PRISTIQ) 100 MG 24 hr tablet Take 100 mg by mouth daily.    . diazepam (VALIUM) 2 MG tablet Take 1 tablet (2 mg total) by mouth at bedtime as needed. 30 tablet 0  . fenofibrate 160 MG tablet Take 160 mg by mouth daily.    Marland Kitchen ibuprofen (ADVIL,MOTRIN) 600 MG tablet Take 1 tablet (600 mg total) by mouth every 6 (six) hours as needed (mild pain). 30 tablet 0  . losartan (COZAAR) 50 MG tablet Take 50 mg by mouth daily.    . magnesium oxide (MAG-OX) 400 MG tablet Take 400 mg by mouth daily.    . metoprolol succinate (TOPROL-XL) 100 MG 24 hr tablet Take 100 mg by mouth daily. Take with or immediately following a meal.    . Omega-3 Fatty Acids (FISH OIL PO) Take 500 mg by mouth daily.     Marland Kitchen omeprazole (PRILOSEC) 20 MG capsule Take 20 mg by mouth daily.    . prochlorperazine (COMPAZINE) 10 MG tablet Take 1 tablet (10 mg total) by mouth every 6 (six) hours as needed for nausea or vomiting. 10 tablet 0  . tizanidine (ZANAFLEX) 2 MG capsule Take 2 mg by mouth every 4 (four) hours as needed for muscle spasms.     No current facility-administered medications for this visit.       ALLERGIES: Hydrocodone and Percocet [oxycodone-acetaminophen]        Family History  Problem Relation Age of Onset  . Heart failure Mother     congestive  . Diabetes Mother   . Hypertension Mother   . Heart disease Father  MI  . Diabetes Father   . Heart attack Father   . Cancer Brother 78    lung cancer  . Cancer Brother     metastasized, unknown origin  . Colon cancer Paternal Grandmother   . Diabetes Brother     Social History        Social History  . Marital status: Single    Spouse name: N/A  . Number of children: 2  . Years of education: N/A        Occupational History  .      Intake coordinator         Social History Main Topics  . Smoking status: Current Every Day Smoker    Packs/day: 0.50    Years: 35.00    Types: Cigarettes  . Smokeless tobacco: Never Used  . Alcohol use No  . Drug use: No  . Sexual activity: No     Comment: Hyst       Other Topics Concern  . Not on file      Social History Narrative  . No narrative on file    ROS:  Pertinent items are noted in HPI.  PHYSICAL EXAMINATION:    BP 120/76 (BP Location: Right Arm, Patient Position: Sitting, Cuff Size: Normal)   Pulse 64   Ht 5\' 2"  (1.575 m)   Wt 171 lb 9.6 oz (77.8 kg)   LMP 03/07/2004 (Approximate)   BMI 31.39 kg/m     General appearance: alert, cooperative and appears stated age.  Wearing TENS unit across her shoulders. Head: Normocephalic, without obvious abnormality, atraumatic Neck: no adenopathy, supple, symmetrical, trachea midline and thyroid normal to inspection and palpation Lungs: clear to auscultation bilaterally Breasts: normal appearance, no masses or tenderness, No nipple retraction or dimpling, No nipple discharge or bleeding, No axillary or supraclavicular adenopathy Heart: regular rate and rhythm Abdomen: soft, non-tender, no masses,  no organomegaly Extremities: extremities normal, atraumatic, no  cyanosis or edema Skin: Skin color, texture, turgor normal. No rashes or lesions Lymph nodes: Cervical, supraclavicular, and axillary nodes normal. No abnormal inguinal nodes palpated Neurologic: Grossly normal  Pelvic: External genitalia:  no lesions              Urethra:  normal appearing urethra with no masses, tenderness or lesions.  No sling exposure.              Bartholins and Skenes: normal                 Vagina: normal appearing vagina with normal color and discharge, no lesions              Cervix:  absent                Bimanual Exam:  Uterus:   absent              Adnexa: no mass, fullness, tenderness            Chaperone was present for exam.  ASSESSMENT  Urinary retention following midurethral sling.  No evidence of recurrence of prolapse.  PLAN  Proceed with lysis of midurethral sling and cystoscopy.  Risks, benefits, and alternatives reviewed with the patient who wishes to proceed. Will adjust the date of surgery.     An After Visit Summary was printed and given to the patient.  __15____ minutes face to face time of which over 50% was spent in counseling.

## 2016-02-26 NOTE — Progress Notes (Signed)
Update to History and Physical  Had a UTI which was treated.  Test of cure negative for infection.   Results in EPIC.   Patient examined.   OK to proceed with surgery.

## 2016-02-26 NOTE — Discharge Instructions (Addendum)
Post Anesthesia Home Care Instructions  NO IBUPROFEN PRODUCTS UNTIL: 3:00 PM TODAY  Activity: Get plenty of rest for the remainder of the day. A responsible adult should stay with you for 24 hours following the procedure.  For the next 24 hours, DO NOT: -Drive a car -Paediatric nurse -Drink alcoholic beverages -Take any medication unless instructed by your physician -Make any legal decisions or sign important papers.  Meals: Start with liquid foods such as gelatin or soup. Progress to regular foods as tolerated. Avoid greasy, spicy, heavy foods. If nausea and/or vomiting occur, drink only clear liquids until the nausea and/or vomiting subsides. Call your physician if vomiting continues.  Special Instructions/Symptoms: Your throat may feel dry or sore from the anesthesia or the breathing tube placed in your throat during surgery. If this causes discomfort, gargle with warm salt water. The discomfort should disappear within 24 hours.  If you had a scopolamine patch placed behind your ear for the management of post- operative nausea and/or vomiting:  1. The medication in the patch is effective for 72 hours, after which it should be removed.  Wrap patch in a tissue and discard in the trash. Wash hands thoroughly with soap and water. 2. You may remove the patch earlier than 72 hours if you experience unpleasant side effects which may include dry mouth, dizziness or visual disturbances. 3. Avoid touching the patch. Wash your hands with soap and water after contact with the patch.   Gerald Stabs,   I was able to transect the sling as planned.  I immediately saw separation of the edges of the sling, meaning that there will now be less compression of the urethra.  I expect your voiding will normalize.  I did place a long acting suture along the bladder to give it additional support because it looked like it had dropped a little bit.  Everything looked normal inside the urethra and the bladder.    Thank you,   Josefa Half, MD  Anterior and Posterior Colporrhaphy, Care After Refer to this sheet in the next few weeks. These instructions provide you with information on caring for yourself after your procedure. Your health care provider may also give you more specific instructions. Your treatment has been planned according to current medical practices, but problems sometimes occur. Call your health care provider if you have any problems or questions after your procedure. HOME CARE INSTRUCTIONS   Take frequent rest periods throughout the day.   Only take over-the-counter or prescription medicines as directed by your health care provider.   Avoid strenuous activity such as heavy lifting (more than 10 pounds [4.5 kg]), pushing, and pulling until your health care provider says it is okay.   Take showers if your health care provider approves. Pat incisions dry. Do not rub incisions with a washcloth or towel. Do not take tub baths until your health care provider approves.   Wear compression stockings as directed by your health care provider. These stockings help prevent blood clots from forming in your legs.   Talk with your health care provider about when you may return to work and your exercise routine.   Do not drive until your health care provider approves.   You may resume your normal diet. Eat a well-balanced diet.   Drink enough fluids to keep your urine clear or pale yellow.   Your normal bowel function should return. If you become constipated, you may:   Take a mild laxative.   Add fruit and bran to  your diet.   Drink more fluids.  Do not have sexual intercourse until permitted by your health care provider.  Follow up with your health care provider as directed. SEEK MEDICAL CARE IF: You have persistent nausea or vomiting.  SEEK IMMEDIATE MEDICAL CARE IF:   You have increased bleeding (more than a small spot) from the vaginal area.   Your pain is not  relieved with medicine or becomes worse.   You have redness, swelling, or increasing pain in the vaginal area.   You have abdominal pain.   You see pus coming from the wounds.   You develop a fever.   You have a foul smell coming from your vaginal area.   You develop light-headedness or you feel faint.   You have difficulty breathing.  MAKE SURE YOU:  Understand these instructions.  Will watch your condition.  Will get help right away if you are not doing well or get worse.   This information is not intended to replace advice given to you by your health care provider. Make sure you discuss any questions you have with your health care provider.   Document Released: 01/09/2005 Document Revised: 02/23/2013 Document Reviewed: 11/12/2012 Elsevier Interactive Patient Education [employer].

## 2016-02-26 NOTE — Transfer of Care (Signed)
Immediate Anesthesia Transfer of Care Note  Patient: [name]  Procedure(s) Performed: Procedure(s) with comments: Lysis of mid urethral sling, Anterior Colporrhaphy, Cystoscopy  (N/A) - first case/ move first case to follow this. Need 1 hour/  CYSTOSCOPY (N/A)  Patient Location: PACU  Anesthesia Type:General  Level of Consciousness: awake, alert  and sedated  Airway & Oxygen Therapy: Patient Spontanous Breathing and Patient connected to nasal cannula oxygen  Post-op Assessment: Report given to RN and Post -op Vital signs reviewed and stable  Post vital signs: Reviewed and stable  Last Vitals:  Vitals:   02/26/16 0622  BP: 115/62  Pulse: (!) 59  Resp: 16  Temp: 36.3 C    Last Pain:  Vitals:   02/26/16 0622  TempSrc: Oral      Patients Stated Pain Goal: 3 ([id number])  Complications: No apparent anesthesia complications

## 2016-02-26 NOTE — Anesthesia Preprocedure Evaluation (Signed)
Anesthesia Evaluation  Patient identified by MRN, date of birth, ID band Patient awake    Reviewed: Allergy & Precautions, NPO status , Patient's Chart, lab work & pertinent test results  History of Anesthesia Complications (+) PONV  Airway Mallampati: I  TM Distance: >3 FB Neck ROM: Full    Dental   Pulmonary Current Smoker,    Pulmonary exam normal        Cardiovascular hypertension, Pt. on medications Normal cardiovascular exam     Neuro/Psych Depression    GI/Hepatic   Endo/Other    Renal/GU      Musculoskeletal   Abdominal   Peds  Hematology   Anesthesia Other Findings   Reproductive/Obstetrics                             Anesthesia Physical Anesthesia Plan  ASA: II  Anesthesia Plan: General   Post-op Pain Management:    Induction: Intravenous  Airway Management Planned: LMA  Additional Equipment:   Intra-op Plan:   Post-operative Plan: Extubation in OR  Informed Consent: I have reviewed the patients History and Physical, chart, labs and discussed the procedure including the risks, benefits and alternatives for the proposed anesthesia with the patient or authorized representative who has indicated his/her understanding and acceptance.     Plan Discussed with: CRNA and Surgeon  Anesthesia Plan Comments:         Anesthesia Quick Evaluation

## 2016-02-26 NOTE — Op Note (Deleted)
  The note originally documented on this encounter has been moved the the encounter in which it belongs.  

## 2016-02-26 NOTE — Anesthesia Postprocedure Evaluation (Signed)
Anesthesia Post Note  Patient: [name]  Procedure(s) Performed: Procedure(s) (LRB): Lysis of mid urethral sling, Anterior Colporrhaphy, Cystoscopy  (N/A) CYSTOSCOPY (N/A)  Patient location during evaluation: PACU Anesthesia Type: General Level of consciousness: awake and alert Pain management: pain level controlled Vital Signs Assessment: post-procedure vital signs reviewed and stable Respiratory status: spontaneous breathing, nonlabored ventilation, respiratory function stable and patient connected to nasal cannula oxygen Cardiovascular status: blood pressure returned to baseline and stable Postop Assessment: no signs of nausea or vomiting Anesthetic complications: no     Last Vitals:  Vitals:   02/26/16 1015 02/26/16 1045  BP: 103/71   Pulse: 76   Resp: 15   Temp:  36.7 C    Last Pain:  Vitals:   02/26/16 1045  TempSrc:   PainSc: 0-No pain   Pain Goal: Patients Stated Pain Goal: 3 (02/26/16 1045)               Tonatiuh Mallon DAVID

## 2016-02-26 NOTE — Brief Op Note (Signed)
02/26/2016  9:22 AM  PATIENT:  [name]  60 y.o. female  PRE-OPERATIVE DIAGNOSIS:  post op urinary retention  POST-OPERATIVE DIAGNOSIS:   Post op urinary retention, cystocele.   PROCEDURE:  Procedure(s) with comments: Lysis of mid urethral sling, Anterior Colporrhaphy, Cystoscopy  (N/A) - first case/ move first case to follow this. Need 1 hour/  CYSTOSCOPY (N/A)  SURGEON:  Surgeon(s) and Role:    * Salvadore Dom, MD - Assisting    * Nunzio Cobbs, MD - Primary  PHYSICIAN ASSISTANT: NA  ASSISTANTS: Sumner Boast, MD   ANESTHESIA:   local and LMA.   EBL:  Total I/O In: 1000 [I.V.:1000] Out: 450 [Urine:450]  BLOOD ADMINISTERED:none  DRAINS: none   LOCAL MEDICATIONS USED:  LIDOCAINE WITH EPINEPHRINE 1:100,000.  SPECIMEN:  No Specimen  DISPOSITION OF SPECIMEN:  N/A  COUNTS:  YES  TOURNIQUET:  * No tourniquets in log *  DICTATION: .Other Dictation: Dictation Number    PLAN OF CARE: Discharge to home after PACU  PATIENT DISPOSITION:  PACU - hemodynamically stable.   Delay start of Pharmacological VTE agent (>24hrs) due to surgical blood loss or risk of bleeding: not applicable

## 2016-03-04 ENCOUNTER — Encounter (HOSPITAL_COMMUNITY): Payer: Self-pay | Admitting: Obstetrics and Gynecology

## 2016-03-07 ENCOUNTER — Telehealth: Payer: Self-pay | Admitting: Hematology and Oncology

## 2016-03-07 ENCOUNTER — Ambulatory Visit (HOSPITAL_BASED_OUTPATIENT_CLINIC_OR_DEPARTMENT_OTHER): Payer: Medicare Other

## 2016-03-07 ENCOUNTER — Ambulatory Visit (HOSPITAL_COMMUNITY)
Admission: RE | Admit: 2016-03-07 | Discharge: 2016-03-07 | Disposition: A | Discharge status: Home / Self Care | Payer: Medicare Other | Source: Ambulatory Visit | Attending: Hematology and Oncology | Admitting: Hematology and Oncology

## 2016-03-07 DIAGNOSIS — M47819 Spondylosis without myelopathy or radiculopathy, site unspecified: Secondary | ICD-10-CM | POA: Diagnosis not present

## 2016-03-07 DIAGNOSIS — D472 Monoclonal gammopathy: Secondary | ICD-10-CM | POA: Insufficient documentation

## 2016-03-07 DIAGNOSIS — M899 Disorder of bone, unspecified: Secondary | ICD-10-CM | POA: Diagnosis not present

## 2016-03-07 LAB — CBC WITH DIFFERENTIAL/PLATELET
BASO%: 0.8 % (ref 0.0–2.0)
BASOS ABS: 0.1 10*3/uL (ref 0.0–0.1)
EOS%: 3.6 % (ref 0.0–7.0)
Eosinophils Absolute: 0.3 10*3/uL (ref 0.0–0.5)
HCT: 37.8 % (ref 34.8–46.6)
HEMOGLOBIN: 12.3 g/dL (ref 11.6–15.9)
LYMPH#: 3 10*3/uL (ref 0.9–3.3)
LYMPH%: 38.1 % (ref 14.0–49.7)
MCH: 29.1 pg (ref 25.1–34.0)
MCHC: 32.5 g/dL (ref 31.5–36.0)
MCV: 89.4 fL (ref 79.5–101.0)
MONO#: 0.5 10*3/uL (ref 0.1–0.9)
MONO%: 6.8 % (ref 0.0–14.0)
NEUT%: 50.7 % (ref 38.4–76.8)
NEUTROS ABS: 4 10*3/uL (ref 1.5–6.5)
NRBC: 0 % (ref 0–0)
Platelets: 414 10*3/uL — ABNORMAL HIGH (ref 145–400)
RBC: 4.23 10*6/uL (ref 3.70–5.45)
RDW: 12.7 % (ref 11.2–14.5)
WBC: 7.8 10*3/uL (ref 3.9–10.3)

## 2016-03-07 LAB — COMPREHENSIVE METABOLIC PANEL
ALBUMIN: 4 g/dL (ref 3.5–5.0)
ALK PHOS: 72 U/L (ref 40–150)
ALT: 12 U/L (ref 0–55)
AST: 14 U/L (ref 5–34)
Anion Gap: 11 mEq/L (ref 3–11)
BUN: 16.5 mg/dL (ref 7.0–26.0)
CO2: 25 meq/L (ref 22–29)
CREATININE: 0.9 mg/dL (ref 0.6–1.1)
Calcium: 9.5 mg/dL (ref 8.4–10.4)
Chloride: 103 mEq/L (ref 98–109)
EGFR: 73 mL/min/{1.73_m2} — ABNORMAL LOW (ref >90)
GLUCOSE: 120 mg/dL (ref 70–140)
Potassium: 4.7 mEq/L (ref 3.5–5.1)
SODIUM: 139 meq/L (ref 136–145)
TOTAL PROTEIN: 7.5 g/dL (ref 6.4–8.3)

## 2016-03-07 NOTE — Telephone Encounter (Signed)
Spoke with Dr. Ernestina Penna desk nurse re a Dr Alvy Bimler patient on Dr. Ernestina Penna schedule.   Per last pof sent for patient September 2016. Patient should have lab/xray 03/07/16 and see Dr. Alvy Bimler 03/14/16.  Patient scheduled for Dr. Vita Erm today (03/07/16) and lab 03/14/16.  Appointment adjusted to lab/xray today (03/07/16) and Dr. Alvy Bimler 03/13/16. Spoke with patient she is aware. Dr. Ernestina Penna desk nurse also aware.

## 2016-03-11 LAB — KAPPA/LAMBDA LIGHT CHAINS
IG KAPPA FREE LIGHT CHAIN: 18.7 mg/L (ref 3.3–19.4)
IG LAMBDA FREE LIGHT CHAIN: 13.9 mg/L (ref 5.7–26.3)
Kappa/Lambda FluidC Ratio: 1.35 (ref 0.26–1.65)

## 2016-03-12 ENCOUNTER — Encounter: Payer: Self-pay | Admitting: Obstetrics and Gynecology

## 2016-03-12 ENCOUNTER — Ambulatory Visit (INDEPENDENT_AMBULATORY_CARE_PROVIDER_SITE_OTHER): Payer: Medicare Other | Admitting: Obstetrics and Gynecology

## 2016-03-12 VITALS — BP 136/72 | HR 70 | Ht 62.0 in | Wt 171.0 lb

## 2016-03-12 DIAGNOSIS — N390 Urinary tract infection, site not specified: Secondary | ICD-10-CM

## 2016-03-12 DIAGNOSIS — Z9889 Other specified postprocedural states: Secondary | ICD-10-CM

## 2016-03-12 DIAGNOSIS — R8290 Unspecified abnormal findings in urine: Secondary | ICD-10-CM | POA: Diagnosis not present

## 2016-03-12 DIAGNOSIS — R82998 Other abnormal findings in urine: Secondary | ICD-10-CM

## 2016-03-12 LAB — POCT URINALYSIS DIPSTICK
BILIRUBIN UA: NEGATIVE
Blood, UA: NEGATIVE
Glucose, UA: NEGATIVE
KETONES UA: NEGATIVE
Nitrite, UA: NEGATIVE
Protein, UA: NEGATIVE
Urobilinogen, UA: NEGATIVE
pH, UA: 6

## 2016-03-12 NOTE — Progress Notes (Signed)
GYNECOLOGY  VISIT   HPI: 60 y.o.   Single  Caucasian  female   [id number] with Patient's last menstrual period was 03/07/2004 (approximate).   here for 2 week follow up Lysis of mid urethral sling, Anterior Colporrhaphy, Cystoscopy (N/A ) CYSTOSCOPY (N/A ).  Patient complaining of possible UTI or vaginal infection. She complains of odor with urinating and not sure if it is in urine of vagina.  Voiding well since lysis of sling.  Patient expressing her appreciation and satisfaction with surgery. No leakage of urine as far as she knows.   Some vaginal discharge that is yellow.  No pain with voiding. No vaginal bleeding.    Has been lifting her dog who is falling from vertigo.  No urinary leakage with this.   Urine Dip: 2+WBCs  GYNECOLOGIC HISTORY: Patient's last menstrual period was 03/07/2004 (approximate). Contraception:  Postmenopausal Menopausal hormone therapy:  none Last mammogram: 07-26-15 3D/Density B/Neg/BiRads1:[facility]  Last pap smear:  2007 normal         OB History    Gravida Para Term Preterm AB Living   3 2 2  0 1 2   SAB TAB Ectopic Multiple Live Births   1 0 0 0 2         Patient Active Problem List   Diagnosis Date Noted  . Status post surgery 10/16/2015  . MGUS (monoclonal gammopathy of unknown significance) 03/15/2015  . Tobacco abuse 03/14/2014  . Monoclonal (M) protein disease, multiple 'M' protein   . Osteoarthritis   . Mitral valve prolapse   . HTN (hypertension)   . Hyperlipidemia     Past Medical History:  Diagnosis Date  . Cervical disc disease   . Complication of anesthesia    prolonged sedation  . Depression   . GERD (gastroesophageal reflux disease)   . HTN (hypertension)   . Hyperlipidemia   . MGUS (monoclonal gammopathy of unknown significance) 03/15/2015  . Migraine headache   . Mitral valve prolapse   . Monoclonal (M) protein disease, multiple 'M' protein   . Osteoarthritis    generalized  . PONV (postoperative nausea  and vomiting)   . Sleep apnea     Past Surgical History:  Procedure Laterality Date  . ANTERIOR AND POSTERIOR REPAIR N/A 10/16/2015   Procedure: ANTERIOR (CYSTOCELE) AND POSTERIOR REPAIR (RECTOCELE);  Surgeon: Nunzio Cobbs, MD;  Location: [facility];  Service: Gynecology;  Laterality: N/A;  . APPENDECTOMY  1974  . BLADDER SUSPENSION N/A 10/16/2015   Procedure: TRANSVAGINAL TAPE (TVT) PROCEDURE exact midurethral sling;  Surgeon: Nunzio Cobbs, MD;  Location: [facility];  Service: Gynecology;  Laterality: N/A;  . BREAST BIOPSY     negative x 2   . BUNIONECTOMY     w/hammer toe repair  . CARPAL TUNNEL RELEASE Right   . CERVICAL DISC SURGERY  6/06  . CYSTO N/A 10/16/2015   Procedure: Kathrene Alu;  Surgeon: Nunzio Cobbs, MD;  Location: [facility];  Service: Gynecology;  Laterality: N/A;  . CYSTOSCOPY N/A 02/26/2016   Procedure: CYSTOSCOPY;  Surgeon: Nunzio Cobbs, MD;  Location: [facility];  Service: Gynecology;  Laterality: N/A;  . KNEE ARTHROSCOPY     right knee x 2  . MENISCUS REPAIR     right knee x 1  . TENS     upper and lower back  . TONSILECTOMY, ADENOIDECTOMY, BILATERAL MYRINGOTOMY AND TUBES    . TOTAL VAGINAL HYSTERECTOMY  05/2004  adenomyosis, prolapse--ovaries remain  . TRANSVAGINAL TAPE (TVT) REMOVAL N/A 02/26/2016   Procedure: Lysis of mid urethral sling, Anterior Colporrhaphy, Cystoscopy ;  Surgeon: Nunzio Cobbs, MD;  Location: [facility];  Service: Gynecology;  Laterality: N/A;  first case/ move first case to follow this. Need 1 hour/   . TUBAL LIGATION      Current Outpatient Prescriptions  Medication Sig Dispense Refill  . acetaminophen (TYLENOL) 500 MG tablet Take 1,000 mg by mouth daily as needed for mild pain. Take 1000 mg daily    . ALPRAZolam (XANAX) 0.5 MG tablet Take 0.25-0.5 mg by mouth 2 (two) times daily as needed for anxiety.     Marland Kitchen aspirin EC 81 MG tablet Take 81 mg by mouth daily.    . Cholecalciferol (VITAMIN D-3) 5000  UNITS TABS Take 1 tablet by mouth daily.    . Coenzyme Q10 300 MG CAPS Take 1 capsule by mouth daily.    Marland Kitchen desvenlafaxine (PRISTIQ) 100 MG 24 hr tablet Take 100 mg by mouth daily.    . fenofibrate 160 MG tablet Take 160 mg by mouth daily.    Marland Kitchen ibuprofen (ADVIL,MOTRIN) 600 MG tablet Take 1 tablet (600 mg total) by mouth every 6 (six) hours as needed (mild pain). 30 tablet 0  . losartan (COZAAR) 50 MG tablet Take 50 mg by mouth daily.    . magnesium oxide (MAG-OX) 400 MG tablet Take 400 mg by mouth daily.    . metoprolol succinate (TOPROL-XL) 100 MG 24 hr tablet Take 100 mg by mouth daily. Take with or immediately following a meal.    . Omega-3 Fatty Acids (FISH OIL PO) Take 500 mg by mouth daily.     Marland Kitchen omeprazole (PRILOSEC) 20 MG capsule Take 20 mg by mouth daily.    . tizanidine (ZANAFLEX) 2 MG capsule Take 2 mg by mouth every 4 (four) hours as needed for muscle spasms.     No current facility-administered medications for this visit.      ALLERGIES: Hydrocodone and Percocet [oxycodone-acetaminophen]  Family History  Problem Relation Age of Onset  . Heart failure Mother     congestive  . Diabetes Mother   . Hypertension Mother   . Heart disease Father     MI  . Diabetes Father   . Heart attack Father   . Cancer Brother 19    lung cancer  . Cancer Brother     metastasized, unknown origin  . Colon cancer Paternal Grandmother   . Diabetes Brother     Social History   Social History  . Marital status: Single    Spouse name: N/A  . Number of children: 2  . Years of education: N/A   Occupational History  .      Intake coordinator   Social History Main Topics  . Smoking status: Current Every Day Smoker    Packs/day: 0.50    Years: 35.00    Types: Cigarettes  . Smokeless tobacco: Never Used  . Alcohol use No  . Drug use: No  . Sexual activity: No     Comment: Hyst   Other Topics Concern  . Not on file   Social History Narrative  . No narrative on file    ROS:   Pertinent items are noted in HPI.  PHYSICAL EXAMINATION:    BP 136/72 (BP Location: Right Arm, Patient Position: Sitting, Cuff Size: Normal)   Pulse 70   Ht 5\' 2"  (1.575 m)  Wt 171 lb (77.6 kg)   LMP 03/07/2004 (Approximate)   BMI 31.28 kg/m     General appearance: alert, cooperative and appears stated age   Pelvic: External genitalia:  no lesions              Urethra:  normal appearing urethra with no masses, tenderness or lesions              Bartholins and Skenes: normal                 Vagina:  Sutures intact.  Mild amount of drainage.  No signs of clumpy or foul smelling discharge.              Cervix:  Absent.                Bimanual Exam:  Uterus:   Absent.              Adnexa: no mass, fullness, tenderness               Chaperone was present for exam.  ASSESSMENT  Status post lysis of midurethral sling and anterior colporrhaphy.   Voiding well.  Urinary odor.  WBCs in urine.   PLAN  Will sent urine for micro and culture.  No abx at a this time.  Will send Rx to pharmacy for abx if patient calls back with increased symptoms prior to getting results of the UC.  Follow up in 4 weeks for final post op visit.     An After Visit Summary was printed and given to the patient.

## 2016-03-13 ENCOUNTER — Ambulatory Visit (HOSPITAL_BASED_OUTPATIENT_CLINIC_OR_DEPARTMENT_OTHER): Payer: Medicare Other | Admitting: Hematology and Oncology

## 2016-03-13 ENCOUNTER — Encounter: Payer: Self-pay | Admitting: Hematology and Oncology

## 2016-03-13 ENCOUNTER — Telehealth: Payer: Self-pay | Admitting: Hematology and Oncology

## 2016-03-13 VITALS — BP 116/78 | HR 65 | Temp 98.5°F | Resp 18 | Wt 172.8 lb

## 2016-03-13 DIAGNOSIS — M159 Polyosteoarthritis, unspecified: Secondary | ICD-10-CM

## 2016-03-13 DIAGNOSIS — D472 Monoclonal gammopathy: Secondary | ICD-10-CM | POA: Diagnosis not present

## 2016-03-13 DIAGNOSIS — M150 Primary generalized (osteo)arthritis: Secondary | ICD-10-CM | POA: Diagnosis not present

## 2016-03-13 DIAGNOSIS — Z720 Tobacco use: Secondary | ICD-10-CM

## 2016-03-13 LAB — URINE CULTURE: Organism ID, Bacteria: 10000

## 2016-03-13 LAB — URINALYSIS, MICROSCOPIC ONLY
Bacteria, UA: NONE SEEN [HPF]
Casts: NONE SEEN [LPF]
Crystals: NONE SEEN [HPF]
RBC / HPF: NONE SEEN RBC/HPF (ref <=2)
Yeast: NONE SEEN [HPF]

## 2016-03-13 NOTE — Assessment & Plan Note (Signed)
I spent some time counseling the patient the importance of tobacco cessation. she is currently attempting to quit on her own 

## 2016-03-13 NOTE — Assessment & Plan Note (Signed)
Clinically, she has no evidence of disease progression or end organ damage. I recommend yearly visit with history, physical examination, blood work and defer skeletal survey.  

## 2016-03-13 NOTE — Progress Notes (Signed)
[facility] OFFICE PROGRESS NOTE  Patient Care Team: Harlan Stains, MD as PCP - General (Family Medicine) Bo Merino, MD as Consulting Physician (Rheumatology) Harlan Stains, MD as Attending Physician (Family Medicine)  SUMMARY OF ONCOLOGIC HISTORY:  [name] was transferred to my care after her prior physician has left.  I reviewed the patient's records extensive and collaborated the history with the patient. Summary of her history is as follows: She reports she still has persistent diffuse bone pain in her shoulders, low back, bilateral hips, bilateral knees. She recently has TENS unit implantation however has not had much improvement of her diffuse bone pain.  She has mild fatigue due to chronic bone pain. She had abnormal M spike detected with no evidence of lytic lesion. She is being observed. She denies recent infection. She continues to have chronic back pain, knee pain and neck pain.  INTERVAL HISTORY: Please see below for problem oriented charting. She is doing well. Denies recent infection. She continues to have diffuse bone pain, stable. She continues to smoke  REVIEW OF SYSTEMS:   Constitutional: Denies fevers, chills or abnormal weight loss Eyes: Denies blurriness of vision Ears, nose, mouth, throat, and face: Denies mucositis or sore throat Respiratory: Denies cough, dyspnea or wheezes Cardiovascular: Denies palpitation, chest discomfort or lower extremity swelling Gastrointestinal:  Denies nausea, heartburn or change in bowel habits Skin: Denies abnormal skin rashes Lymphatics: Denies new lymphadenopathy or easy bruising Neurological:Denies numbness, tingling or new weaknesses Behavioral/Psych: Mood is stable, no new changes  All other systems were reviewed with the patient and are negative.  I have reviewed the past medical history, past surgical history, social history and family history with the patient and they are unchanged from  previous note.  ALLERGIES:  is allergic to hydrocodone and percocet [oxycodone-acetaminophen].  MEDICATIONS:  Current Outpatient Prescriptions  Medication Sig Dispense Refill  . acetaminophen (TYLENOL) 500 MG tablet Take 1,000 mg by mouth daily as needed for mild pain. Take 1000 mg daily    . ALPRAZolam (XANAX) 0.5 MG tablet Take 0.25-0.5 mg by mouth 2 (two) times daily as needed for anxiety.     Marland Kitchen aspirin EC 81 MG tablet Take 81 mg by mouth daily.    . Cholecalciferol (VITAMIN D-3) 5000 UNITS TABS Take 1 tablet by mouth daily.    . Coenzyme Q10 300 MG CAPS Take 1 capsule by mouth daily.    Marland Kitchen desvenlafaxine (PRISTIQ) 100 MG 24 hr tablet Take 100 mg by mouth daily.    . fenofibrate 160 MG tablet Take 160 mg by mouth daily.    Marland Kitchen ibuprofen (ADVIL,MOTRIN) 600 MG tablet Take 1 tablet (600 mg total) by mouth every 6 (six) hours as needed (mild pain). 30 tablet 0  . losartan (COZAAR) 50 MG tablet Take 50 mg by mouth daily.    . magnesium oxide (MAG-OX) 400 MG tablet Take 400 mg by mouth daily.    . metoprolol succinate (TOPROL-XL) 100 MG 24 hr tablet Take 100 mg by mouth daily. Take with or immediately following a meal.    . Omega-3 Fatty Acids (FISH OIL PO) Take 500 mg by mouth daily.     Marland Kitchen omeprazole (PRILOSEC) 20 MG capsule Take 20 mg by mouth daily.    . tizanidine (ZANAFLEX) 2 MG capsule Take 2 mg by mouth every 4 (four) hours as needed for muscle spasms.     No current facility-administered medications for this visit.     PHYSICAL EXAMINATION: ECOG PERFORMANCE  STATUS: 0 - Asymptomatic  Vitals:   03/13/16 1024  BP: 116/78  Pulse: 65  Resp: 18  Temp: 98.5 F (36.9 C)   Filed Weights   03/13/16 1024  Weight: 172 lb 12.8 oz (78.4 kg)    GENERAL:alert, no distress and comfortable. She is mildly obese SKIN: skin color, texture, turgor are normal, no rashes or significant lesions EYES: normal, Conjunctiva are pink and non-injected, sclera clear OROPHARYNX:no exudate, no erythema  and lips, buccal mucosa, and tongue normal  NECK: supple, thyroid normal size, non-tender, without nodularity LYMPH:  no palpable lymphadenopathy in the cervical, axillary or inguinal LUNGS: clear to auscultation and percussion with normal breathing effort HEART: regular rate & rhythm and no murmurs and no lower extremity edema ABDOMEN:abdomen soft, non-tender and normal bowel sounds Musculoskeletal:no cyanosis of digits and no clubbing  NEURO: alert & oriented x 3 with fluent speech, no focal motor/sensory deficits  LABORATORY DATA:  I have reviewed the data as listed    Component Value Date/Time   NA 139 03/07/2016 0957   K 4.7 03/07/2016 0957   CL 102 02/13/2016 1207   CL 103 06/11/2012 1059   CO2 25 03/07/2016 0957   GLUCOSE 120 03/07/2016 0957   GLUCOSE 123 (H) 06/11/2012 1059   BUN 16.5 03/07/2016 0957   CREATININE 0.9 03/07/2016 0957   CALCIUM 9.5 03/07/2016 0957   PROT 7.5 03/07/2016 0957   ALBUMIN 4.0 03/07/2016 0957   AST 14 03/07/2016 0957   ALT 12 03/07/2016 0957   ALKPHOS 72 03/07/2016 0957   BILITOT <0.30 03/07/2016 0957   GFRNONAA >60 02/13/2016 1207   GFRAA >60 02/13/2016 1207    No results found for: SPEP, UPEP  Lab Results  Component Value Date   WBC 7.8 03/07/2016   NEUTROABS 4.0 03/07/2016   HGB 12.3 03/07/2016   HCT 37.8 03/07/2016   MCV 89.4 03/07/2016   PLT 414 (H) 03/07/2016      Chemistry      Component Value Date/Time   NA 139 03/07/2016 0957   K 4.7 03/07/2016 0957   CL 102 02/13/2016 1207   CL 103 06/11/2012 1059   CO2 25 03/07/2016 0957   BUN 16.5 03/07/2016 0957   CREATININE 0.9 03/07/2016 0957      Component Value Date/Time   CALCIUM 9.5 03/07/2016 0957   ALKPHOS 72 03/07/2016 0957   AST 14 03/07/2016 0957   ALT 12 03/07/2016 0957   BILITOT <0.30 03/07/2016 0957       ASSESSMENT & PLAN:  MGUS (monoclonal gammopathy of unknown significance) Clinically, she has no evidence of disease progression or end organ damage. I  recommend yearly visit with history, physical examination, blood work and defer skeletal survey.   Osteoarthritis I recommend she continue taking high-dose vitamin D supplements.  I reviewed the skeletal survey in great detail. The lytic lesion seen in the pubic ramus does not correspond to the area of her pain. I recommend observation only  Tobacco abuse I spent some time counseling the patient the importance of tobacco cessation. she is currently attempting to quit on her own    Orders Placed This Encounter  Procedures  . CBC with Differential/Platelet    Standing Status:   Future    Standing Expiration Date:   04/17/2017  . Comprehensive metabolic panel    Standing Status:   Future    Standing Expiration Date:   04/17/2017  . Kappa/lambda light chains    Standing Status:   Future  Standing Expiration Date:   04/17/2017  . Multiple Myeloma Panel (SPEP&IFE w/QIG)    Standing Status:   Future    Standing Expiration Date:   04/17/2017   All questions were answered. The patient knows to call the clinic with any problems, questions or concerns. No barriers to learning was detected. I spent 15 minutes counseling the patient face to face. The total time spent in the appointment was 20 minutes and more than 50% was on counseling and review of test results     [facility], [place], MD 03/13/2016 12:06 PM

## 2016-03-13 NOTE — Assessment & Plan Note (Signed)
I recommend she continue taking high-dose vitamin D supplements.  I reviewed the skeletal survey in great detail. The lytic lesion seen in the pubic ramus does not correspond to the area of her pain. I recommend observation only

## 2016-03-13 NOTE — Telephone Encounter (Signed)
Avs report and  appt schd given per  03/13/16 los °

## 2016-03-14 ENCOUNTER — Other Ambulatory Visit: Payer: Medicare Other

## 2016-03-14 ENCOUNTER — Ambulatory Visit: Payer: Medicare Other | Admitting: Hematology

## 2016-03-14 LAB — MULTIPLE MYELOMA PANEL, SERUM
ALBUMIN SERPL ELPH-MCNC: 4.2 g/dL (ref 2.9–4.4)
Albumin/Glob SerPl: 1.5 (ref 0.7–1.7)
Alpha 1: 0.2 g/dL (ref 0.0–0.4)
Alpha2 Glob SerPl Elph-Mcnc: 0.8 g/dL (ref 0.4–1.0)
B-Globulin SerPl Elph-Mcnc: 1.2 g/dL (ref 0.7–1.3)
Gamma Glob SerPl Elph-Mcnc: 0.8 g/dL (ref 0.4–1.8)
Globulin, Total: 3 g/dL (ref 2.2–3.9)
IGA/IMMUNOGLOBULIN A, SERUM: 125 mg/dL (ref 87–352)
IgG, Qn, Serum: 696 mg/dL — ABNORMAL LOW (ref 700–1600)
IgM, Qn, Serum: 149 mg/dL (ref 26–217)
M Protein SerPl Elph-Mcnc: 0.2 g/dL — ABNORMAL HIGH
TOTAL PROTEIN: 7.2 g/dL (ref 6.0–8.5)

## 2016-04-09 ENCOUNTER — Encounter: Payer: Self-pay | Admitting: Obstetrics and Gynecology

## 2016-04-09 ENCOUNTER — Ambulatory Visit (INDEPENDENT_AMBULATORY_CARE_PROVIDER_SITE_OTHER): Payer: Medicare Other | Admitting: Obstetrics and Gynecology

## 2016-04-09 VITALS — BP 138/70 | HR 64 | Ht 62.0 in | Wt 172.4 lb

## 2016-04-09 DIAGNOSIS — Z9889 Other specified postprocedural states: Secondary | ICD-10-CM

## 2016-04-09 NOTE — Progress Notes (Signed)
GYNECOLOGY  VISIT   HPI: 60 y.o.   Single  Caucasian  female   [id number] with Patient's last menstrual period was 03/07/2004 (approximate).   here for 6 week follow up Lysis of mid urethral sling, Anterior Colporrhaphy, Cystoscopy (N/A ) CYSTOSCOPY (N/A ) - 02/26/16.  Really happy with the outcome of the surgery to lyse the sling.  No leakage or urine.  Voiding well.  No bleeding.   UC negative on 03/12/16.  Going to NH with her partner.   GYNECOLOGIC HISTORY: Patient's last menstrual period was 03/07/2004 (approximate). Contraception:  Postmenopausal Menopausal hormone therapy:  none Last mammogram:   07-26-15 3D/Density B/Neg/BiRads1:[facility]  Last pap smear:  2007 normal           OB History    Gravida Para Term Preterm AB Living   3 2 2  0 1 2   SAB TAB Ectopic Multiple Live Births   1 0 0 0 2         Patient Active Problem List   Diagnosis Date Noted  . Status post surgery 10/16/2015  . MGUS (monoclonal gammopathy of unknown significance) 03/15/2015  . Tobacco abuse 03/14/2014  . Monoclonal (M) protein disease, multiple 'M' protein   . Osteoarthritis   . Mitral valve prolapse   . HTN (hypertension)   . Hyperlipidemia     Past Medical History:  Diagnosis Date  . Cervical disc disease   . Complication of anesthesia    prolonged sedation  . Depression   . GERD (gastroesophageal reflux disease)   . HTN (hypertension)   . Hyperlipidemia   . MGUS (monoclonal gammopathy of unknown significance) 03/15/2015  . Migraine headache   . Mitral valve prolapse   . Monoclonal (M) protein disease, multiple 'M' protein   . Osteoarthritis    generalized  . PONV (postoperative nausea and vomiting)   . Sleep apnea     Past Surgical History:  Procedure Laterality Date  . ANTERIOR AND POSTERIOR REPAIR N/A 10/16/2015   Procedure: ANTERIOR (CYSTOCELE) AND POSTERIOR REPAIR (RECTOCELE);  Surgeon: Nunzio Cobbs, MD;  Location: [facility];  Service: Gynecology;   Laterality: N/A;  . APPENDECTOMY  1974  . BLADDER SUSPENSION N/A 10/16/2015   Procedure: TRANSVAGINAL TAPE (TVT) PROCEDURE exact midurethral sling;  Surgeon: Nunzio Cobbs, MD;  Location: [facility];  Service: Gynecology;  Laterality: N/A;  . BREAST BIOPSY     negative x 2   . BUNIONECTOMY     w/hammer toe repair  . CARPAL TUNNEL RELEASE Right   . CERVICAL DISC SURGERY  6/06  . CYSTO N/A 10/16/2015   Procedure: Kathrene Alu;  Surgeon: Nunzio Cobbs, MD;  Location: [facility];  Service: Gynecology;  Laterality: N/A;  . CYSTOSCOPY N/A 02/26/2016   Procedure: CYSTOSCOPY;  Surgeon: Nunzio Cobbs, MD;  Location: [facility];  Service: Gynecology;  Laterality: N/A;  . KNEE ARTHROSCOPY     right knee x 2  . MENISCUS REPAIR     right knee x 1  . TENS     upper and lower back  . TONSILECTOMY, ADENOIDECTOMY, BILATERAL MYRINGOTOMY AND TUBES    . TOTAL VAGINAL HYSTERECTOMY  05/2004   adenomyosis, prolapse--ovaries remain  . TRANSVAGINAL TAPE (TVT) REMOVAL N/A 02/26/2016   Procedure: Lysis of mid urethral sling, Anterior Colporrhaphy, Cystoscopy ;  Surgeon: Nunzio Cobbs, MD;  Location: [facility];  Service: Gynecology;  Laterality: N/A;  first case/ move  first case to follow this. Need 1 hour/   . TUBAL LIGATION      Current Outpatient Prescriptions  Medication Sig Dispense Refill  . acetaminophen (TYLENOL) 500 MG tablet Take 1,000 mg by mouth daily as needed for mild pain. Take 1000 mg daily    . ALPRAZolam (XANAX) 0.5 MG tablet Take 0.25-0.5 mg by mouth 2 (two) times daily as needed for anxiety.     Marland Kitchen aspirin EC 81 MG tablet Take 81 mg by mouth daily.    . Cholecalciferol (VITAMIN D-3) 5000 UNITS TABS Take 1 tablet by mouth daily.    . Coenzyme Q10 300 MG CAPS Take 1 capsule by mouth daily.    Marland Kitchen desvenlafaxine (PRISTIQ) 100 MG 24 hr tablet Take 100 mg by mouth daily.    . fenofibrate 160 MG tablet Take 160 mg by mouth daily.    Marland Kitchen ibuprofen (ADVIL,MOTRIN) 600 MG tablet Take  1 tablet (600 mg total) by mouth every 6 (six) hours as needed (mild pain). 30 tablet 0  . losartan (COZAAR) 50 MG tablet Take 50 mg by mouth daily.    . magnesium oxide (MAG-OX) 400 MG tablet Take 400 mg by mouth daily.    . metoprolol succinate (TOPROL-XL) 100 MG 24 hr tablet Take 100 mg by mouth daily. Take with or immediately following a meal.    . Omega-3 Fatty Acids (FISH OIL PO) Take 500 mg by mouth daily.     Marland Kitchen omeprazole (PRILOSEC) 20 MG capsule Take 20 mg by mouth daily.    . tizanidine (ZANAFLEX) 2 MG capsule Take 2 mg by mouth every 4 (four) hours as needed for muscle spasms.     No current facility-administered medications for this visit.      ALLERGIES: Hydrocodone and Percocet [oxycodone-acetaminophen]  Family History  Problem Relation Age of Onset  . Heart failure Mother     congestive  . Diabetes Mother   . Hypertension Mother   . Heart disease Father     MI  . Diabetes Father   . Heart attack Father   . Cancer Brother 19    lung cancer  . Cancer Brother     metastasized, unknown origin  . Colon cancer Paternal Grandmother   . Diabetes Brother     Social History   Social History  . Marital status: Single    Spouse name: N/A  . Number of children: 2  . Years of education: N/A   Occupational History  .      Intake coordinator   Social History Main Topics  . Smoking status: Current Every Day Smoker    Packs/day: 0.50    Years: 35.00    Types: Cigarettes  . Smokeless tobacco: Never Used  . Alcohol use No  . Drug use: No  . Sexual activity: No     Comment: Hyst   Other Topics Concern  . Not on file   Social History Narrative  . No narrative on file    ROS:  Pertinent items are noted in HPI.  PHYSICAL EXAMINATION:    BP 138/70 (BP Location: Right Arm, Patient Position: Sitting, Cuff Size: Normal)   Pulse 64   Ht 5\' 2"  (1.575 m)   Wt 172 lb 6.4 oz (78.2 kg)   LMP 03/07/2004 (Approximate)   BMI 31.53 kg/m     General appearance: alert,  cooperative and appears stated age    Pelvic: External genitalia:  no lesions  Urethra:  normal appearing urethra with no masses, tenderness or lesions.  Sutures absent.                Bartholins and Skenes: normal                 Vagina: normal appearing vagina with normal color and discharge, no lesions.  Shortened vagina.  Good support.               Cervix:  absent   Bimanual:                          Uterus:  absent.              Adnexa: no mass, fullness, tenderness               Chaperone was present for exam.  ASSESSMENT  Status post transection of midurethral sling.  Healing well.  Continent of urine.   PLAN  Cautioned against heavy lifting/excessive straining so we can protect her surgical repair! Return for routine care with Ed[facility] Circle before the end of the year.    An After Visit Summary was printed and given to the patient.

## 2016-06-20 ENCOUNTER — Other Ambulatory Visit: Payer: Self-pay | Admitting: Nurse Practitioner

## 2016-06-20 DIAGNOSIS — Z1231 Encounter for screening mammogram for malignant neoplasm of breast: Secondary | ICD-10-CM

## 2016-06-23 ENCOUNTER — Telehealth: Payer: Self-pay | Admitting: Nurse Practitioner

## 2016-06-23 NOTE — Telephone Encounter (Signed)
Left patient a message to call back to reschedule a future appointment that was cancelled by the provider. °

## 2016-07-03 ENCOUNTER — Ambulatory Visit: Payer: Medicare Other | Admitting: Nurse Practitioner

## 2016-07-08 ENCOUNTER — Ambulatory Visit (INDEPENDENT_AMBULATORY_CARE_PROVIDER_SITE_OTHER): Payer: Medicare Other | Admitting: Nurse Practitioner

## 2016-07-08 ENCOUNTER — Encounter: Payer: Self-pay | Admitting: Nurse Practitioner

## 2016-07-08 VITALS — BP 112/80 | HR 68 | Resp 16 | Ht 62.5 in | Wt 170.2 lb

## 2016-07-08 DIAGNOSIS — R5382 Chronic fatigue, unspecified: Secondary | ICD-10-CM | POA: Diagnosis not present

## 2016-07-08 DIAGNOSIS — Z01419 Encounter for gynecological examination (general) (routine) without abnormal findings: Secondary | ICD-10-CM | POA: Diagnosis not present

## 2016-07-08 DIAGNOSIS — Z0000 Encounter for general adult medical examination without abnormal findings: Secondary | ICD-10-CM | POA: Diagnosis not present

## 2016-07-08 DIAGNOSIS — N951 Menopausal and female climacteric states: Secondary | ICD-10-CM

## 2016-07-08 DIAGNOSIS — E2839 Other primary ovarian failure: Secondary | ICD-10-CM | POA: Diagnosis not present

## 2016-07-08 LAB — HEPATITIS C ANTIBODY: HCV Ab: NEGATIVE

## 2016-07-08 LAB — POCT URINALYSIS DIPSTICK
Bilirubin, UA: NEGATIVE
Glucose, UA: NEGATIVE
Ketones, UA: NEGATIVE
Leukocytes, UA: NEGATIVE
Nitrite, UA: NEGATIVE
PH UA: 5
PROTEIN UA: NEGATIVE
RBC UA: NEGATIVE
UROBILINOGEN UA: NEGATIVE

## 2016-07-08 NOTE — Patient Instructions (Signed)
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
## 2016-07-09 LAB — HIV ANTIBODY (ROUTINE TESTING W REFLEX): HIV: NONREACTIVE

## 2016-07-09 LAB — ESTRADIOL

## 2016-07-09 LAB — FOLLICLE STIMULATING HORMONE: FSH: 50.1 m[IU]/mL

## 2016-07-13 NOTE — Progress Notes (Signed)
Encounter reviewed by Dr. Brook Amundson C. Silva.  

## 2016-07-28 ENCOUNTER — Ambulatory Visit: Payer: Medicare Other

## 2016-08-13 ENCOUNTER — Encounter (HOSPITAL_COMMUNITY): Payer: Self-pay

## 2016-09-05 ENCOUNTER — Ambulatory Visit
Admission: RE | Admit: 2016-09-05 | Discharge: 2016-09-05 | Disposition: A | Discharge status: Home / Self Care | Payer: Medicare Other | Source: Ambulatory Visit | Attending: Nurse Practitioner | Admitting: Nurse Practitioner

## 2016-09-05 DIAGNOSIS — Z1231 Encounter for screening mammogram for malignant neoplasm of breast: Secondary | ICD-10-CM

## 2016-09-05 DIAGNOSIS — E2839 Other primary ovarian failure: Secondary | ICD-10-CM

## 2016-09-23 ENCOUNTER — Ambulatory Visit (HOSPITAL_COMMUNITY): Payer: Medicare Other | Admitting: Psychiatry

## 2016-10-08 ENCOUNTER — Encounter (INDEPENDENT_AMBULATORY_CARE_PROVIDER_SITE_OTHER): Payer: Self-pay

## 2016-10-08 ENCOUNTER — Encounter: Payer: Self-pay | Admitting: Neurology

## 2016-10-08 ENCOUNTER — Ambulatory Visit (INDEPENDENT_AMBULATORY_CARE_PROVIDER_SITE_OTHER): Payer: Medicare Other | Admitting: Neurology

## 2016-10-08 VITALS — BP 152/82 | HR 66 | Resp 16 | Ht 62.5 in | Wt 165.5 lb

## 2016-10-08 DIAGNOSIS — M150 Primary generalized (osteo)arthritis: Secondary | ICD-10-CM

## 2016-10-08 DIAGNOSIS — D472 Monoclonal gammopathy: Secondary | ICD-10-CM

## 2016-10-08 DIAGNOSIS — Z9989 Dependence on other enabling machines and devices: Secondary | ICD-10-CM | POA: Diagnosis not present

## 2016-10-08 DIAGNOSIS — F329 Major depressive disorder, single episode, unspecified: Secondary | ICD-10-CM | POA: Insufficient documentation

## 2016-10-08 DIAGNOSIS — M159 Polyosteoarthritis, unspecified: Secondary | ICD-10-CM

## 2016-10-08 DIAGNOSIS — F32A Depression, unspecified: Secondary | ICD-10-CM | POA: Insufficient documentation

## 2016-10-08 DIAGNOSIS — G4733 Obstructive sleep apnea (adult) (pediatric): Secondary | ICD-10-CM | POA: Diagnosis not present

## 2016-10-08 DIAGNOSIS — R531 Weakness: Secondary | ICD-10-CM

## 2016-10-08 MED ORDER — POTASSIUM CHLORIDE ER 10 MEQ PO TBCR: 10.0000 meq | EXTENDED_RELEASE_TABLET | Freq: Two times a day (BID) | ORAL | 5 refills | Status: DC

## 2016-10-08 NOTE — Progress Notes (Signed)
[facility]  PATIENT: [name] DOB: [birth year]  REFERRING DOCTOR OR PCP:  Dr. Harlan Stains SOURCE: patient, records from Dr. Dema Severin, imaging and lab reports  _________________________________   HISTORICAL  CHIEF COMPLAINT:  Chief Complaint  Patient presents with  . Extremity Weakness    [name] is here for eval of intermittent episodes of generalized weakness onset 14 mos. ago.  "My body just crashes."  Sts. episodes have become more frequent over the last month--about once a week and lasting from 2-9 hrs.  Sts. she is prediabetic, cbg ranges from 70's to 140's during episodes.  Sts. has been cleared by endocrinology, rheumatology and cardiology.[name]  . ATHENA LABS    Dr. Dianne Dun has ordered [facility] lab test # [id number], for periodic paralysis.  Lab requisition faxed to [facility] at fax# [phone].  Copy of requisition, and instruction to call [name], at [phone], along with pt. assistance application, given to pt.  Pt. to call [name] to verify cost of lab, and for directions on having labs drawn.  She verbalized understanding of same/fim    HISTORY OF PRESENT ILLNESS:  I had the pleasure of seeing your patient, [name], at [facility] for neurologic consultation regarding her episodes of weakness.  Starting 14 years ago, she began to have spells of weakness.    The first one occurred after driving to work.  When she got to the parking lot she felt her body was very weak and she was trembling trying to support her weight.   She felt her body weighted a thousand pounds.   Her heart felt like it was beating very fast.   For the next 8 hours she was weak and she was taken home to rest.    Since then, she would have episodes every 2 months (1-3 month intervals).   The second episode, she wen to the ER and saw cardiology and she was told everything was ok (EKG, blood, stress test, catheter).  The onset of the episode is very rapid over a  couple minutes.   Episodes would last 2 to 9 hours.  Recovery was more gradual over a couple hours most times.    She denied cramps but sometimes her muscles feel like they are burning.      Episodes are mostly in the afternoon about 3 hours after lunch and she notes she is usually just mildly more active before an episode.       She was diagnosed with severe OSA last year and placed on CPAP.   She felt better afterwards with less sleepiness.   However, she actually had more episodes afterwards including a few episodes in a row over a couple weaks.   She underwent an endocrinology evaluation and rheumatology evaluation and was told everything was normal.   Heavy meals do not trigger an episode.   She wore a glucose monitor and sugars were mildly high (160) before the episode and mildly low during the episode (70's).   A low carbohydrate, high protein diet has not helped.     She has MGUS associated with fatigue. There is no evidence of any lytic lesions. She is followed by Dr. Alvy Bimler of [facility].   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   No wheezes GastrointestinaI: No nausea, vomiting, diarrhea, abdominal pain, fecal incontinence.  She has GERD Genitourinary: No dysuria, urinary retention or frequency.  No nocturia. Musculoskeletal: No neck pain, back pain Integumentary: No rash, pruritus, skin lesions Neurological: as above Psychiatric: Notes depression and anxiety Endocrine: No palpitations, diaphoresis, change in appetite, change in weigh or increased thirst Hematologic/Lymphatic: No anemia, purpura, petechiae.   She has MGUS Allergic/Immunologic: No itchy/runny eyes,  nasal congestion, recent allergic reactions, rashes  ALLERGIES: Allergies  Allergen Reactions  . Hydrocodone Other (See Comments)    Headache  . Percocet [Oxycodone-Acetaminophen] Other (See Comments)    Causes headaches    HOME MEDICATIONS:  Current Outpatient Prescriptions:  .  acetaminophen (TYLENOL) 500 MG tablet, Take 1,000 mg by mouth daily as needed for mild pain. Take 1000 mg daily, Disp: , Rfl:  .  ALPRAZolam (XANAX) 0.5 MG tablet, Take 0.25-0.5 mg by mouth 2 (two) times daily as needed for anxiety. , Disp: , Rfl:  .  aspirin EC 81 MG tablet, Take 81 mg by mouth daily., Disp: , Rfl:  .  Brexpiprazole (REXULTI) 2 MG TABS, Take by mouth at bedtime., Disp: , Rfl:  .  Cholecalciferol (VITAMIN D-3) 5000 UNITS TABS, Take 1 tablet by mouth daily., Disp: , Rfl:  .  Coenzyme Q10 300 MG CAPS, Take 1 capsule by mouth daily., Disp: , Rfl:  .  desvenlafaxine (PRISTIQ) 100 MG 24 hr tablet, Take 100 mg by mouth daily., Disp: , Rfl:  .  fenofibrate 160 MG tablet, Take 160 mg by mouth daily., Disp: , Rfl:  .  ibuprofen (ADVIL,MOTRIN) 600 MG tablet, Take 1 tablet (600 mg total) by mouth every 6 (six) hours as needed (mild pain)., Disp: 30 tablet, Rfl: 0 .  losartan (COZAAR) 50 MG tablet, Take 50 mg by mouth daily., Disp: , Rfl:  .  magnesium oxide (MAG-OX) 400 MG tablet, Take 400 mg by mouth daily., Disp: , Rfl:  .  metoprolol succinate (TOPROL-XL) 100 MG 24 hr tablet, Take 100 mg by mouth daily. Take with or immediately following a meal., Disp: , Rfl:  .  Omega-3 Fatty Acids (FISH OIL PO), Take 500 mg by mouth daily. , Disp: , Rfl:  .  omeprazole (PRILOSEC) 20 MG capsule, Take 20 mg by mouth daily., Disp: , Rfl:  .  tizanidine (ZANAFLEX) 2 MG capsule, Take 2 mg by mouth every 4 (four) hours as needed for muscle spasms., Disp: , Rfl:  .  potassium chloride (K-DUR) 10 MEQ tablet, Take 1 tablet (10 mEq total) by mouth 2 (two) times daily., Disp: 60 tablet, Rfl: 5  PAST MEDICAL HISTORY: Past  Medical History:  Diagnosis Date  . Cervical disc disease   . Complication of anesthesia    prolonged sedation  . Depression   . GERD (gastroesophageal reflux disease)   . HTN (hypertension)   . Hyperlipidemia   . MGUS (monoclonal gammopathy of unknown significance) 03/15/2015  . Migraine headache   . Mitral valve prolapse   . Monoclonal (M) protein disease, multiple 'M' protein   . Osteoarthritis    generalized  . PONV (postoperative nausea and vomiting)   . Sleep apnea   . Vision abnormalities     PAST SURGICAL HISTORY: Past Surgical History:  Procedure Laterality Date  . ANTERIOR AND POSTERIOR REPAIR N/A 10/16/2015   Procedure: ANTERIOR (CYSTOCELE) AND POSTERIOR REPAIR (RECTOCELE);  Surgeon: Nunzio Cobbs, MD;  Location: [facility];  Service: Gynecology;  Laterality: N/A;  . APPENDECTOMY  1974  . BLADDER SUSPENSION N/A 10/16/2015   Procedure: TRANSVAGINAL TAPE (TVT) PROCEDURE exact midurethral sling;  Surgeon: Nunzio Cobbs, MD;  Location: [facility];  Service: Gynecology;  Laterality: N/A;  . BREAST BIOPSY     negative x 2   . BREAST EXCISIONAL BIOPSY Left 1998  . BREAST EXCISIONAL BIOPSY Right 1986  . BUNIONECTOMY     w/hammer toe repair  . CARPAL TUNNEL RELEASE Right   . CERVICAL DISC SURGERY  6/06  . CYSTO N/A 10/16/2015   Procedure: Kathrene Alu;  Surgeon: Nunzio Cobbs, MD;  Location: [facility];  Service: Gynecology;  Laterality: N/A;  . CYSTOSCOPY N/A 02/26/2016   Procedure: CYSTOSCOPY;  Surgeon: Nunzio Cobbs, MD;  Location: [facility];  Service: Gynecology;  Laterality: N/A;  . KNEE ARTHROSCOPY     right knee x 2  . MENISCUS REPAIR     right knee x 1  . TENS     upper and lower back  . TONSILECTOMY, ADENOIDECTOMY, BILATERAL MYRINGOTOMY AND TUBES    . TOTAL VAGINAL HYSTERECTOMY  05/2004   adenomyosis, prolapse--ovaries remain  . TRANSVAGINAL TAPE (TVT) REMOVAL N/A 02/26/2016   Procedure: Lysis of mid urethral sling, Anterior Colporrhaphy,  Cystoscopy ;  Surgeon: Nunzio Cobbs, MD;  Location: [facility];  Service: Gynecology;  Laterality: N/A;  first case/ move first case to follow this. Need 1 hour/   . TUBAL LIGATION      FAMILY HISTORY: Family History  Problem Relation Age of Onset  . Heart failure Mother     congestive  . Diabetes Mother   . Hypertension Mother   . Heart disease Father     MI  . Diabetes Father   . Heart attack Father   . Cancer Brother 15    lung cancer  . Cancer Brother     metastasized, unknown origin  . Colon cancer Paternal Grandmother   . Diabetes Brother   . Breast cancer Cousin   . Breast cancer Cousin     SOCIAL HISTORY:  Social History   Social History  . Marital status: Single    Spouse name: N/A  . Number of children: 2  . Years of education: N/A   Occupational History  .      Intake coordinator   Social History Main Topics  . Smoking status: Current Every Day Smoker    Packs/day: 0.50    Years: 35.00    Types: Cigarettes  . Smokeless tobacco: Never Used  . Alcohol use No  . Drug use: No  . Sexual activity: No     Comment: Hyst   Other Topics Concern  . Not on file   Social History Narrative  . No narrative on file     PHYSICAL EXAM  Vitals:   10/08/16 0906  BP: (!) 152/82  Pulse: 66  Resp: 16  Weight: 165 lb 8 oz (75.1 kg)  Height: 5' 2.5" (1.588 m)    Body mass index is 29.79 kg/m.   General: The patient is well-developed and well-nourished and in no acute distress  Eyes:  Funduscopic exam shows normal optic discs and retinal vessels.  Neck: The neck is supple, no carotid bruits are noted.  The neck is nontender.  Cardiovascular: The heart has a regular rate and rhythm with a normal S1 and S2. There were no murmurs, gallops or rubs. Lungs  are clear to auscultation.  Skin: Extremities are without significant edema.  Musculoskeletal:  Back istender over lumbar paraspinal muscles  Neurologic Exam  Mental status: The patient is  alert and oriented x 3 at the time of the examination. The patient has apparent normal recent and remote memory, with an apparently normal attention span and concentration ability.   Speech is normal.  Cranial nerves: Extraocular movements are full. Pupils are equal, round, and reactive to light and accomodation.  Visual fields are full.  Facial symmetry is present. There is good facial sensation to soft touch bilaterally.Facial strength is normal.  Trapezius and sternocleidomastoid strength is normal. No dysarthria is noted.  The tongue is midline, and the patient has symmetric elevation of the soft palate. No obvious hearing deficits are noted.  Motor:  Muscle bulk is normal.   Tone is normal. Strength is  5 / 5 in all 4 extremities.   There is no myotonia noted.   Sensory: Sensory testing is intact to pinprick, soft touch and vibration sensation in the arms and she has mild numbness in her toes.  Coordination: Cerebellar testing reveals good finger-nose-finger and heel-to-shin bilaterally.  Gait and station: Station is normal.   Gait is arthritic. Tandem gait is wide. Romberg is negative.   Reflexes: Deep tendon reflexes are symmetric and normal in arms and reduced at ankles.   Plantar responses are flexor.    DIAGNOSTIC DATA (LABS, IMAGING, TESTING) - I reviewed patient records, labs, notes, testing and imaging myself where available.  Lab Results  Component Value Date   WBC 7.8 03/07/2016   HGB 12.3 03/07/2016   HCT 37.8 03/07/2016   MCV 89.4 03/07/2016   PLT 414 (H) 03/07/2016      Component Value Date/Time   NA 139 03/07/2016 0957   K 4.7 03/07/2016 0957   CL 102 02/13/2016 1207   CL 103 06/11/2012 1059   CO2 25 03/07/2016 0957   GLUCOSE 120 03/07/2016 0957   GLUCOSE 123 (H) 06/11/2012 1059   BUN 16.5 03/07/2016 0957   CREATININE 0.9 03/07/2016 0957   CALCIUM 9.5 03/07/2016 0957   PROT 7.5 03/07/2016 0957   ALBUMIN 4.0 03/07/2016 0957   AST 14 03/07/2016 0957   ALT 12  03/07/2016 0957   ALKPHOS 72 03/07/2016 0957   BILITOT <0.30 03/07/2016 0957   GFRNONAA >60 02/13/2016 1207   GFRAA >60 02/13/2016 1207       ASSESSMENT AND PLAN  Episodic weakness  MGUS (monoclonal gammopathy of unknown significance)  Depression, unspecified depression type  Primary osteoarthritis involving multiple joints  OSA on CPAP   In summary, [name] is a 61 year old woman with episodes of weakness since 2009 lasting 2-9 hours occurring every with increased frequency.    The episodes did not improve after her OSA was treated. I do not think that her spine DJD or  the MGUS is playing a role (though MGUS can explain her mild foot numbness).   I am concerned about the possibility of a neuromuscular disease such as periodic paralysis (either hypokalemic or hyperkalemic).   She does not appear to have paramyotonia congenita that can also cause a periodic paralysis.   Since most of the episodes occur a couple hours after lunch, they may be triggered by hypokalemia.    I will have her try potassium supplements 10 mEq twice a day. If this is not successful, we will try Diamox.    Additionally, I will try to get DNA testing for periodic  paralysis (there are many genes that have been found to play a role and testing is available for the more common ones).    She will return to see me in 3 months but call us if she continues to have episodes after supplementing her potassium or if she has new symptoms.  Thank you for asking me to see [name] for a neurologic consultation. Please let me know if I can be of further assistance with her or other patients in the future.    Terri Murphey A. Felecia Shelling, MD, PhD 07/07/5518, 8:02 PM Certified in Neurology, Clinical Neurophysiology, Sleep Medicine, Pain Medicine and Neuroimaging  [facility] [address] [place], [place] [address] [phone]

## 2016-10-13 ENCOUNTER — Telehealth: Payer: Self-pay | Admitting: Neurology

## 2016-10-13 NOTE — Telephone Encounter (Signed)
I have spoken with [name] this afternoon and advised that with dizziness, jaw pain, tingling left hand and chest tightness, she should be seen in the nearest ER asap./fim

## 2016-10-13 NOTE — Telephone Encounter (Signed)
Patient called office in reference to potassium chloride (K-DUR) 10 MEQ tablet.  Patient states she has been light headed/dizzy, today having numbness and tingling in left hand, states she feels tightness in her chest after taking medication but will go away about an hour later.  Thru out the day patient will feels tightness in her jaw on and off. Patient would like to know if these are side effects and if she should stop taking the medication.

## 2016-10-14 MED ORDER — ACETAZOLAMIDE 250 MG PO TABS: 250.0000 mg | ORAL_TABLET | Freq: Three times a day (TID) | ORAL | 11 refills | Status: DC

## 2016-10-14 NOTE — Telephone Encounter (Signed)
Pt calling stating Dr Felecia Shelling told her that if the potassium did not work for her that she would then be considered for a dieretic. Since she is no longer taking the potassium she would like to know if and when Dr Felecia Shelling can get the dieretic called in for her

## 2016-10-14 NOTE — Telephone Encounter (Signed)
I have spoken with Doriann this afternoon, and per RAS, offered Diamox 250mg  po bid.  She is agreeable.  Rx. escribed to [facility] per her request/fim

## 2016-10-14 NOTE — Addendum Note (Signed)
Addended by: France Ravens I on: 10/14/2016 05:36 PM   Modules accepted: Orders

## 2016-10-15 NOTE — Addendum Note (Signed)
Addended by: France Ravens I on: 10/15/2016 04:22 PM   Modules accepted: Orders

## 2016-10-15 NOTE — Telephone Encounter (Signed)
I have spoken with [name] and verified dosing instructions for Diamox 250mg  is bid.  Rx. corrected in EPIC/fim

## 2016-10-15 NOTE — Telephone Encounter (Signed)
Patient called office in reference to Diamox 250mg  needing clarification on directions of taking the medication.  Please call

## 2016-11-20 ENCOUNTER — Other Ambulatory Visit: Payer: Self-pay | Admitting: *Deleted

## 2016-11-20 DIAGNOSIS — D472 Monoclonal gammopathy: Secondary | ICD-10-CM

## 2016-11-20 DIAGNOSIS — R531 Weakness: Secondary | ICD-10-CM

## 2016-11-20 DIAGNOSIS — R5383 Other fatigue: Secondary | ICD-10-CM

## 2016-11-20 NOTE — Telephone Encounter (Signed)
Pt said she is experiencing weakness/tired all the time. She has been reading on line it can cause potassium levels to drop. She is wanting to know if she should have labs to check. Please call her at [phone]

## 2016-11-20 NOTE — Telephone Encounter (Signed)
[place].  I spoke with [name] reported that her "crashes" of weakness/fatigue have resolved; but she is still having some more constant weakness/fatiguge.  Per RAS, ok to check a BMP.  Orders in EPIC.  Need her to come by the office at her convenience for lab work/fim

## 2016-11-20 NOTE — Telephone Encounter (Signed)
Spoke with [employer].  She is agreeable with BMP; will come in tomorrow b/t 8am-1130/fim

## 2016-11-21 ENCOUNTER — Other Ambulatory Visit (INDEPENDENT_AMBULATORY_CARE_PROVIDER_SITE_OTHER): Payer: Self-pay

## 2016-11-21 DIAGNOSIS — Z0289 Encounter for other administrative examinations: Secondary | ICD-10-CM

## 2016-11-21 DIAGNOSIS — D472 Monoclonal gammopathy: Secondary | ICD-10-CM

## 2016-11-21 DIAGNOSIS — R531 Weakness: Secondary | ICD-10-CM

## 2016-11-21 DIAGNOSIS — R5383 Other fatigue: Secondary | ICD-10-CM

## 2016-11-22 LAB — BASIC METABOLIC PANEL
BUN / CREAT RATIO: 14 (ref 12–28)
BUN: 14 mg/dL (ref 8–27)
CALCIUM: 9.8 mg/dL (ref 8.7–10.3)
CO2: 21 mmol/L (ref 18–29)
CREATININE: 0.97 mg/dL (ref 0.57–1.00)
Chloride: 105 mmol/L (ref 96–106)
GFR calc Af Amer: 72 mL/min/{1.73_m2} (ref >59)
GFR, EST NON AFRICAN AMERICAN: 63 mL/min/{1.73_m2} (ref >59)
GLUCOSE: 123 mg/dL — AB (ref 65–99)
POTASSIUM: 4.9 mmol/L (ref 3.5–5.2)
SODIUM: 141 mmol/L (ref 134–144)

## 2016-11-24 ENCOUNTER — Telehealth: Payer: Self-pay | Admitting: *Deleted

## 2016-11-24 NOTE — Telephone Encounter (Signed)
I have spoken with [name] this morning and per RAS, advised lab work done in our office is fine.  She verbalized understanding of same/fim

## 2016-11-24 NOTE — Telephone Encounter (Signed)
-----   Message from Britt Bottom, MD sent at 11/22/2016  5:25 PM EDT ----- Please let the patient know that the lab work is fine.

## 2017-01-08 ENCOUNTER — Ambulatory Visit (INDEPENDENT_AMBULATORY_CARE_PROVIDER_SITE_OTHER): Payer: Medicare Other | Admitting: Neurology

## 2017-01-08 ENCOUNTER — Encounter (INDEPENDENT_AMBULATORY_CARE_PROVIDER_SITE_OTHER): Payer: Self-pay

## 2017-01-08 ENCOUNTER — Encounter: Payer: Self-pay | Admitting: Neurology

## 2017-01-08 VITALS — BP 127/79 | HR 71 | Ht 62.5 in | Wt 158.5 lb

## 2017-01-08 DIAGNOSIS — R531 Weakness: Secondary | ICD-10-CM

## 2017-01-08 DIAGNOSIS — D472 Monoclonal gammopathy: Secondary | ICD-10-CM

## 2017-01-08 DIAGNOSIS — Z9989 Dependence on other enabling machines and devices: Secondary | ICD-10-CM | POA: Diagnosis not present

## 2017-01-08 DIAGNOSIS — G4733 Obstructive sleep apnea (adult) (pediatric): Secondary | ICD-10-CM

## 2017-01-08 NOTE — Progress Notes (Signed)
[facility]  PATIENT: [name] DOB: 02-03-1956  REFERRING DOCTOR OR PCP:  Dr. Harlan Stains SOURCE: patient, records from Dr. Dema Severin, imaging and lab reports  _________________________________   HISTORICAL  CHIEF COMPLAINT:  Chief Complaint  Patient presents with  . Episodic Weakness    Her symptoms have improved since starting Diamox 250mg , BID.  Reports only having mild, constant, generalized weakness that is tolerable.    HISTORY OF PRESENT ILLNESS:   [name] is a 61 y.o. woman wih episodes of weakness.  At the last visit, Diamox was started. She feels that the generalized weakness has improved and is more tolerable.   She was unable to tolerate the potassium that was first tried.   On Diamox, she had had a couple mild episodes but may concern.   The worse episode a few weeks ago only lasted one hour (others lasted up to 8 hours).   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    The KCNJ2 and SCN4A genes were negative.   Periodic weakness   Starting 14 years ago, she began to have spells of weakness.    The first one occurred after driving to work.  When she got to the parking lot she felt her body was very weak and she was trembling trying to support her weight.   She felt her body weighted a thousand pounds.   Her heart felt like it was beating very fast.   For the next 8 hours she was weak and she was taken home to rest.    Since then, she would have episodes every 2 months (1-3 month intervals).   The second episode, she wen to the ER and saw cardiology and she was told everything was ok (EKG, blood, stress test, catheter).  The onset of the episode is very rapid over a couple minutes.   Episodes would last 2 to 9 hours.  Recovery was more gradual over a couple hours most times.    She denied cramps but sometimes  her muscles feel like they are burning.      Episodes are mostly in the afternoon about 3 hours after lunch and she notes she is usually just mildly more active before an episode.  She underwent an endocrinology evaluation and rheumatology evaluation and was told everything was normal.   Heavy meals do not trigger an episode.   She wore a glucose monitor and sugars were mildly high (160) before the episode and mildly low during the episode (70's).   A low carbohydrate, high protein diet has not helped.        Sleep/OSA:   In 2017, she was diagnosed with severe obstructive sleep apnea. She was placed on CPAP. Although sleepiness improved with CPAP, she continued to experience the episodes.  MGUS:  She has MGUS associated with fatigue. There is no evidence of any lytic lesions. She is followed by Dr. Alvy Bimler of [place].   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  She denies numbness.    REVIEW OF SYSTEMS: Constitutional: No fevers, chills, sweats, or change in appetite Eyes: No visual changes, double vision, eye pain Ear, nose and throat: No hearing loss, ear pain, nasal congestion, sore throat Cardiovascular: No chest pain, palpitations Respiratory: No shortness of breath at rest or with exertion.   No wheezes GastrointestinaI: No nausea, vomiting, diarrhea, abdominal pain,  fecal incontinence.  She has GERD Genitourinary: No dysuria, urinary retention or frequency.  No nocturia. Musculoskeletal: No neck pain, back pain Integumentary: No rash, pruritus, skin lesions Neurological: as above Psychiatric: Notes depression and anxiety Endocrine: No palpitations, diaphoresis, change in appetite, change in weigh or increased thirst Hematologic/Lymphatic: No anemia, purpura, petechiae.   She has MGUS Allergic/Immunologic: No itchy/runny eyes, nasal congestion, recent allergic reactions, rashes  ALLERGIES: Allergies  Allergen Reactions  . Hydrocodone Other (See Comments)    Headache  .  Percocet [Oxycodone-Acetaminophen] Other (See Comments)    Causes headaches    HOME MEDICATIONS:  Current Outpatient Prescriptions:  .  acetaminophen (TYLENOL) 500 MG tablet, Take 1,000 mg by mouth daily as needed for mild pain. Take 1000 mg daily, Disp: , Rfl:  .  acetaZOLAMIDE (DIAMOX) 250 MG tablet, Take 250 mg by mouth 2 (two) times daily., Disp: , Rfl:  .  ALPRAZolam (XANAX) 0.5 MG tablet, Take 0.25-0.5 mg by mouth 2 (two) times daily as needed for anxiety. , Disp: , Rfl:  .  aspirin EC 81 MG tablet, Take 81 mg by mouth daily., Disp: , Rfl:  .  Brexpiprazole (REXULTI) 2 MG TABS, Take by mouth at bedtime., Disp: , Rfl:  .  Cholecalciferol (VITAMIN D-3) 5000 UNITS TABS, Take 1 tablet by mouth daily., Disp: , Rfl:  .  Coenzyme Q10 300 MG CAPS, Take 1 capsule by mouth daily., Disp: , Rfl:  .  desvenlafaxine (PRISTIQ) 100 MG 24 hr tablet, Take 100 mg by mouth daily., Disp: , Rfl:  .  fenofibrate 160 MG tablet, Take 160 mg by mouth daily., Disp: , Rfl:  .  ibuprofen (ADVIL,MOTRIN) 600 MG tablet, Take 1 tablet (600 mg total) by mouth every 6 (six) hours as needed (mild pain)., Disp: 30 tablet, Rfl: 0 .  losartan (COZAAR) 50 MG tablet, Take 50 mg by mouth daily., Disp: , Rfl:  .  magnesium oxide (MAG-OX) 400 MG tablet, Take 400 mg by mouth daily., Disp: , Rfl:  .  metoprolol succinate (TOPROL-XL) 100 MG 24 hr tablet, Take 100 mg by mouth daily. Take with or immediately following a meal., Disp: , Rfl:  .  Omega-3 Fatty Acids (FISH OIL PO), Take 500 mg by mouth daily. , Disp: , Rfl:  .  omeprazole (PRILOSEC) 20 MG capsule, Take 20 mg by mouth daily., Disp: , Rfl:  .  tizanidine (ZANAFLEX) 2 MG capsule, Take 2 mg by mouth every 4 (four) hours as needed for muscle spasms., Disp: , Rfl:   PAST MEDICAL HISTORY: Past Medical History:  Diagnosis Date  . Cervical disc disease   . Complication of anesthesia    prolonged sedation  . Depression   . GERD (gastroesophageal reflux disease)   . HTN  (hypertension)   . Hyperlipidemia   . MGUS (monoclonal gammopathy of unknown significance) 03/15/2015  . Migraine headache   . Mitral valve prolapse   . Monoclonal (M) protein disease, multiple 'M' protein   . Osteoarthritis    generalized  . PONV (postoperative nausea and vomiting)   . Sleep apnea   . Vision abnormalities     PAST SURGICAL HISTORY: Past Surgical History:  Procedure Laterality Date  . ANTERIOR AND POSTERIOR REPAIR N/A 10/16/2015   Procedure: ANTERIOR (CYSTOCELE) AND POSTERIOR REPAIR (RECTOCELE);  Surgeon: Nunzio Cobbs, MD;  Location: [facility];  Service: Gynecology;  Laterality: N/A;  . APPENDECTOMY  1974  . BLADDER SUSPENSION N/A 10/16/2015   Procedure: TRANSVAGINAL TAPE (TVT)  PROCEDURE exact midurethral sling;  Surgeon: Nunzio Cobbs, MD;  Location: [facility];  Service: Gynecology;  Laterality: N/A;  . BREAST BIOPSY     negative x 2   . BREAST EXCISIONAL BIOPSY Left 1998  . BREAST EXCISIONAL BIOPSY Right 1986  . BUNIONECTOMY     w/hammer toe repair  . CARPAL TUNNEL RELEASE Right   . CERVICAL DISC SURGERY  6/06  . CYSTO N/A 10/16/2015   Procedure: Kathrene Alu;  Surgeon: Nunzio Cobbs, MD;  Location: [facility];  Service: Gynecology;  Laterality: N/A;  . CYSTOSCOPY N/A 02/26/2016   Procedure: CYSTOSCOPY;  Surgeon: Nunzio Cobbs, MD;  Location: [facility];  Service: Gynecology;  Laterality: N/A;  . KNEE ARTHROSCOPY     right knee x 2  . MENISCUS REPAIR     right knee x 1  . TENS     upper and lower back  . TONSILECTOMY, ADENOIDECTOMY, BILATERAL MYRINGOTOMY AND TUBES    . TOTAL VAGINAL HYSTERECTOMY  05/2004   adenomyosis, prolapse--ovaries remain  . TRANSVAGINAL TAPE (TVT) REMOVAL N/A 02/26/2016   Procedure: Lysis of mid urethral sling, Anterior Colporrhaphy, Cystoscopy ;  Surgeon: Nunzio Cobbs, MD;  Location: [facility];  Service: Gynecology;  Laterality: N/A;  first case/ move first case to follow this. Need 1 hour/   . TUBAL  LIGATION      FAMILY HISTORY: Family History  Problem Relation Age of Onset  . Heart failure Mother        congestive  . Diabetes Mother   . Hypertension Mother   . Heart disease Father        MI  . Diabetes Father   . Heart attack Father   . Cancer Brother 45       lung cancer  . Cancer Brother        metastasized, unknown origin  . Colon cancer Paternal Grandmother   . Diabetes Brother   . Breast cancer Cousin   . Breast cancer Cousin     SOCIAL HISTORY:  Social History   Social History  . Marital status: Single    Spouse name: N/A  . Number of children: 2  . Years of education: N/A   Occupational History  .      Intake coordinator   Social History Main Topics  . Smoking status: Current Every Day Smoker    Packs/day: 0.50    Years: 35.00    Types: Cigarettes  . Smokeless tobacco: Never Used  . Alcohol use No  . Drug use: No  . Sexual activity: No     Comment: Hyst   Other Topics Concern  . Not on file   Social History Narrative  . No narrative on file     PHYSICAL EXAM  Vitals:   01/08/17 1046  BP: 127/79  Pulse: 71  Weight: 158 lb 8 oz (71.9 kg)  Height: 5' 2.5" (1.588 m)    Body mass index is 28.53 kg/m.   Gen. exam: The head is normocephalic and atraumatic. The neck is nontender. Neck has a good range of motion.  Mental status: She is alert and fully oriented with fluent speech. Memory and attention appear to be appropriate.  Cranial nerves: Extraocular muscles are intact. Pupils react to light and accommodation. Facial strength and sensation is normal. Trapezius strength is normal.  Motor: Muscle bulk is normal. Muscle tone is normal. Strength is 5/5 in the arms and legs.  Sensory: She has intact sensation to touch and vibration in the arms and legs  Coordination: Finger to nose is performed well bilaterally.  Gait: The station is normal. The gait and tandem walk are normal for age.    DIAGNOSTIC DATA (LABS, IMAGING,  TESTING) - I reviewed patient records, labs, notes, testing and imaging myself where available.  Lab Results  Component Value Date   WBC 7.8 03/07/2016   HGB 12.3 03/07/2016   HCT 37.8 03/07/2016   MCV 89.4 03/07/2016   PLT 414 (H) 03/07/2016      Component Value Date/Time   NA 141 11/21/2016 0951   NA 139 03/07/2016 0957   K 4.9 11/21/2016 0951   K 4.7 03/07/2016 0957   CL 105 11/21/2016 0951   CL 103 06/11/2012 1059   CO2 21 11/21/2016 0951   CO2 25 03/07/2016 0957   GLUCOSE 123 (H) 11/21/2016 0951   GLUCOSE 120 03/07/2016 0957   GLUCOSE 123 (H) 06/11/2012 1059   BUN 14 11/21/2016 0951   BUN 16.5 03/07/2016 0957   CREATININE 0.97 11/21/2016 0951   CREATININE 0.9 03/07/2016 0957   CALCIUM 9.8 11/21/2016 0951   CALCIUM 9.5 03/07/2016 0957   PROT 7.5 03/07/2016 0957   ALBUMIN 4.0 03/07/2016 0957   AST 14 03/07/2016 0957   ALT 12 03/07/2016 0957   ALKPHOS 72 03/07/2016 0957   BILITOT <0.30 03/07/2016 0957   GFRNONAA 63 11/21/2016 0951   GFRAA 72 11/21/2016 0951       ASSESSMENT AND PLAN  Episodic weakness  MGUS (monoclonal gammopathy of unknown significance)  OSA on CPAP   1.  Episodic weakness. The etiology remains uncertain but she has improved with Diamox. 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 2.   She will continue Diamox and we can increase the dose to 3 pills a day if more spells occur. 3.    Continue CPAP. 4.    Return in 6 months or sooner if there are new or worsening neurologic symptoms   Nitasha Jewel A. Felecia Shelling, MD, PhD 07/12/1094, 0:45 PM Certified in Neurology, Clinical Neurophysiology, Sleep Medicine, Pain Medicine and Neuroimaging  [facility] [address], [place], [place] [address] [phone]

## 2017-03-06 ENCOUNTER — Other Ambulatory Visit (HOSPITAL_BASED_OUTPATIENT_CLINIC_OR_DEPARTMENT_OTHER): Payer: Medicare Other

## 2017-03-06 DIAGNOSIS — D472 Monoclonal gammopathy: Secondary | ICD-10-CM

## 2017-03-06 LAB — CBC WITH DIFFERENTIAL/PLATELET
BASO%: 0.5 % (ref 0.0–2.0)
BASOS ABS: 0 10*3/uL (ref 0.0–0.1)
EOS ABS: 0.2 10*3/uL (ref 0.0–0.5)
EOS%: 2.9 % (ref 0.0–7.0)
HCT: 39.9 % (ref 34.8–46.6)
HGB: 12.6 g/dL (ref 11.6–15.9)
LYMPH%: 39.6 % (ref 14.0–49.7)
MCH: 29.4 pg (ref 25.1–34.0)
MCHC: 31.6 g/dL (ref 31.5–36.0)
MCV: 93 fL (ref 79.5–101.0)
MONO#: 0.5 10*3/uL (ref 0.1–0.9)
MONO%: 6.6 % (ref 0.0–14.0)
NEUT%: 50.4 % (ref 38.4–76.8)
NEUTROS ABS: 4.1 10*3/uL (ref 1.5–6.5)
Platelets: 404 10*3/uL — ABNORMAL HIGH (ref 145–400)
RBC: 4.29 10*6/uL (ref 3.70–5.45)
RDW: 12.8 % (ref 11.2–14.5)
WBC: 8.1 10*3/uL (ref 3.9–10.3)
lymph#: 3.2 10*3/uL (ref 0.9–3.3)

## 2017-03-06 LAB — COMPREHENSIVE METABOLIC PANEL
ALT: 12 U/L (ref 0–55)
AST: 14 U/L (ref 5–34)
Albumin: 4 g/dL (ref 3.5–5.0)
Alkaline Phosphatase: 60 U/L (ref 40–150)
Anion Gap: 8 mEq/L (ref 3–11)
BUN: 17.2 mg/dL (ref 7.0–26.0)
CHLORIDE: 107 meq/L (ref 98–109)
CO2: 25 meq/L (ref 22–29)
Calcium: 10 mg/dL (ref 8.4–10.4)
Creatinine: 1 mg/dL (ref 0.6–1.1)
EGFR: 60 mL/min/{1.73_m2} — AB (ref >90)
GLUCOSE: 111 mg/dL (ref 70–140)
POTASSIUM: 4.5 meq/L (ref 3.5–5.1)
SODIUM: 140 meq/L (ref 136–145)
Total Bilirubin: 0.3 mg/dL (ref 0.20–1.20)
Total Protein: 7.5 g/dL (ref 6.4–8.3)

## 2017-03-07 LAB — KAPPA/LAMBDA LIGHT CHAINS
IG KAPPA FREE LIGHT CHAIN: 21.2 mg/L — AB (ref 3.3–19.4)
IG LAMBDA FREE LIGHT CHAIN: 16.1 mg/L (ref 5.7–26.3)
KAPPA/LAMBDA FLC RATIO: 1.32 (ref 0.26–1.65)

## 2017-03-09 LAB — MULTIPLE MYELOMA PANEL, SERUM
ALPHA2 GLOB SERPL ELPH-MCNC: 0.8 g/dL (ref 0.4–1.0)
Albumin SerPl Elph-Mcnc: 3.9 g/dL (ref 2.9–4.4)
Albumin/Glob SerPl: 1.3 (ref 0.7–1.7)
Alpha 1: 0.2 g/dL (ref 0.0–0.4)
B-Globulin SerPl Elph-Mcnc: 1.2 g/dL (ref 0.7–1.3)
Gamma Glob SerPl Elph-Mcnc: 0.9 g/dL (ref 0.4–1.8)
Globulin, Total: 3.1 g/dL (ref 2.2–3.9)
IGG (IMMUNOGLOBIN G), SERUM: 872 mg/dL (ref 700–1600)
IGM (IMMUNOGLOBIN M), SRM: 153 mg/dL (ref 26–217)
IgA, Qn, Serum: 128 mg/dL (ref 87–352)
M Protein SerPl Elph-Mcnc: 0.3 g/dL — ABNORMAL HIGH
TOTAL PROTEIN: 7 g/dL (ref 6.0–8.5)

## 2017-03-13 ENCOUNTER — Encounter: Payer: Self-pay | Admitting: Hematology and Oncology

## 2017-03-13 ENCOUNTER — Telehealth: Payer: Self-pay

## 2017-03-13 ENCOUNTER — Ambulatory Visit (HOSPITAL_BASED_OUTPATIENT_CLINIC_OR_DEPARTMENT_OTHER): Payer: Medicare Other | Admitting: Hematology and Oncology

## 2017-03-13 VITALS — BP 132/68 | HR 60 | Temp 98.2°F | Resp 18 | Wt 157.1 lb

## 2017-03-13 DIAGNOSIS — G8929 Other chronic pain: Secondary | ICD-10-CM

## 2017-03-13 DIAGNOSIS — M545 Low back pain: Secondary | ICD-10-CM

## 2017-03-13 DIAGNOSIS — Z122 Encounter for screening for malignant neoplasm of respiratory organs: Secondary | ICD-10-CM

## 2017-03-13 DIAGNOSIS — D472 Monoclonal gammopathy: Secondary | ICD-10-CM | POA: Diagnosis not present

## 2017-03-13 DIAGNOSIS — Z720 Tobacco use: Secondary | ICD-10-CM | POA: Diagnosis not present

## 2017-03-13 DIAGNOSIS — M542 Cervicalgia: Secondary | ICD-10-CM | POA: Diagnosis not present

## 2017-03-13 DIAGNOSIS — M25569 Pain in unspecified knee: Secondary | ICD-10-CM

## 2017-03-13 NOTE — Progress Notes (Signed)
[place] OFFICE PROGRESS NOTE  Patient Care Team: Harlan Stains, MD as PCP - General (Family Medicine) Bo Merino, MD as Consulting Physician (Rheumatology) Harlan Stains, MD as Attending Physician (Family Medicine)  SUMMARY OF ONCOLOGIC HISTORY:  [name] was transferred to my care after her prior physician has left.  I reviewed the patient's records extensive and collaborated the history with the patient. Summary of her history is as follows: She reports she still has persistent diffuse bone pain in her shoulders, low back, bilateral hips, bilateral knees. She recently has TENS unit implantation however has not had much improvement of her diffuse bone pain.  She has mild fatigue due to chronic bone pain. She had abnormal M spike detected with no evidence of lytic lesion. She is being observed.  INTERVAL HISTORY: Please see below for problem oriented charting. She denies recent infection. She continues to have chronic back pain, knee pain and neck pain. She continues to smoke and is attempting to quit smoking Her appetite is stable.  No recent weight loss.  REVIEW OF SYSTEMS:   Constitutional: Denies fevers, chills or abnormal weight loss Eyes: Denies blurriness of vision Ears, nose, mouth, throat, and face: Denies mucositis or sore throat Respiratory: Denies cough, dyspnea or wheezes Cardiovascular: Denies palpitation, chest discomfort or lower extremity swelling Gastrointestinal:  Denies nausea, heartburn or change in bowel habits Skin: Denies abnormal skin rashes Lymphatics: Denies new lymphadenopathy or easy bruising Neurological:Denies numbness, tingling or new weaknesses Behavioral/Psych: Mood is stable, no new changes  All other systems were reviewed with the patient and are negative.  I have reviewed the past medical history, past surgical history, social history and family history with the patient and they are unchanged from previous  note.  ALLERGIES:  is allergic to hydrocodone and percocet [oxycodone-acetaminophen].  MEDICATIONS:  Current Outpatient Prescriptions  Medication Sig Dispense Refill  . acetaminophen (TYLENOL) 500 MG tablet Take 1,000 mg by mouth daily as needed for mild pain. Take 1000 mg daily    . acetaZOLAMIDE (DIAMOX) 250 MG tablet Take 250 mg by mouth 2 (two) times daily.    Marland Kitchen ALPRAZolam (XANAX) 0.5 MG tablet Take 0.25-0.5 mg by mouth 2 (two) times daily as needed for anxiety.     Marland Kitchen aspirin EC 81 MG tablet Take 81 mg by mouth daily.    . Brexpiprazole (REXULTI) 2 MG TABS Take by mouth at bedtime.    . Cholecalciferol (VITAMIN D-3) 5000 UNITS TABS Take 1 tablet by mouth daily.    . Coenzyme Q10 300 MG CAPS Take 1 capsule by mouth daily.    Marland Kitchen desvenlafaxine (PRISTIQ) 100 MG 24 hr tablet Take 100 mg by mouth daily.    . fenofibrate 160 MG tablet Take 160 mg by mouth daily.    Marland Kitchen ibuprofen (ADVIL,MOTRIN) 600 MG tablet Take 1 tablet (600 mg total) by mouth every 6 (six) hours as needed (mild pain). 30 tablet 0  . losartan (COZAAR) 50 MG tablet Take 50 mg by mouth daily.    . magnesium oxide (MAG-OX) 400 MG tablet Take 400 mg by mouth daily.    . metoprolol succinate (TOPROL-XL) 100 MG 24 hr tablet Take 100 mg by mouth daily. Take with or immediately following a meal.    . Omega-3 Fatty Acids (FISH OIL PO) Take 500 mg by mouth daily.     Marland Kitchen omeprazole (PRILOSEC) 20 MG capsule Take 20 mg by mouth daily.    . tizanidine (ZANAFLEX) 2 MG capsule Take  2 mg by mouth every 4 (four) hours as needed for muscle spasms.     No current facility-administered medications for this visit.     PHYSICAL EXAMINATION: ECOG PERFORMANCE STATUS: 0 - Asymptomatic  Vitals:   03/13/17 1110  BP: 132/68  Pulse: 60  Resp: 18  Temp: 98.2 F (36.8 C)  SpO2: 100%   Filed Weights   03/13/17 1110  Weight: 157 lb 1.6 oz (71.3 kg)    GENERAL:alert, no distress and comfortable SKIN: skin color, texture, turgor are normal, no  rashes or significant lesions EYES: normal, Conjunctiva are pink and non-injected, sclera clear OROPHARYNX:no exudate, no erythema and lips, buccal mucosa, and tongue normal  NECK: supple, thyroid normal size, non-tender, without nodularity LYMPH:  no palpable lymphadenopathy in the cervical, axillary or inguinal LUNGS: She has mild crackles at the lung bases, no wheezes, normal breathing effort HEART: regular rate & rhythm and no murmurs and no lower extremity edema ABDOMEN:abdomen soft, non-tender and normal bowel sounds Musculoskeletal:no cyanosis of digits and no clubbing  NEURO: alert & oriented x 3 with fluent speech, no focal motor/sensory deficits  LABORATORY DATA:  I have reviewed the data as listed    Component Value Date/Time   NA 140 03/06/2017 1027   K 4.5 03/06/2017 1027   CL 105 11/21/2016 0951   CL 103 06/11/2012 1059   CO2 25 03/06/2017 1027   GLUCOSE 111 03/06/2017 1027   GLUCOSE 123 (H) 06/11/2012 1059   BUN 17.2 03/06/2017 1027   CREATININE 1.0 03/06/2017 1027   CALCIUM 10.0 03/06/2017 1027   PROT 7.5 03/06/2017 1027   PROT 7.0 03/06/2017 1027   ALBUMIN 4.0 03/06/2017 1027   AST 14 03/06/2017 1027   ALT 12 03/06/2017 1027   ALKPHOS 60 03/06/2017 1027   BILITOT 0.30 03/06/2017 1027   GFRNONAA 63 11/21/2016 0951   GFRAA 72 11/21/2016 0951    No results found for: SPEP, UPEP  Lab Results  Component Value Date   WBC 8.1 03/06/2017   NEUTROABS 4.1 03/06/2017   HGB 12.6 03/06/2017   HCT 39.9 03/06/2017   MCV 93.0 03/06/2017   PLT 404 (H) 03/06/2017      Chemistry      Component Value Date/Time   NA 140 03/06/2017 1027   K 4.5 03/06/2017 1027   CL 105 11/21/2016 0951   CL 103 06/11/2012 1059   CO2 25 03/06/2017 1027   BUN 17.2 03/06/2017 1027   CREATININE 1.0 03/06/2017 1027      Component Value Date/Time   CALCIUM 10.0 03/06/2017 1027   ALKPHOS 60 03/06/2017 1027   AST 14 03/06/2017 1027   ALT 12 03/06/2017 1027   BILITOT 0.30 03/06/2017  1027       ASSESSMENT & PLAN:  MGUS (monoclonal gammopathy of unknown significance) Clinically, she has no evidence of disease progression or end organ damage. I recommend yearly visit with history, physical examination, blood work and defer skeletal survey.   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 CT CHEST LUNG CA SCREEN LOW DOSE W/O CM  Standing Status:   Future    Standing Expiration Date:   05/13/2018    Order Specific Question:   Reason for Exam (SYMPTOM  OR DIAGNOSIS REQUIRED)    Answer:   lung cancer screening, smoker 48 years, chronic cough    Order Specific Question:   Preferred Imaging Location?    Answer:   [facility]    Order Specific Question:   Preferred imaging location?    Answer:   [facility]    Order Specific Question:   Radiology Contrast Protocol - do NOT remove file path    Answer:   [email or web address]   All questions were answered. The patient knows to call the clinic with any problems, questions or concerns. No barriers to learning was detected. I spent 15 minutes counseling the patient face to face. The total time spent in the appointment was 20 minutes and more than 50% was on counseling and review of test results     Heath Lark, MD 03/13/2017 1:13 PM

## 2017-03-13 NOTE — Assessment & Plan Note (Signed)
The patient has coarse crackles at the lung bases likely due to emphysema from chronic tobacco abuse She is attempting to quit smoking on her own We discussed lung cancer screening program and she will qualify, based on her extensive smoking history of almost 50 pack years Ultimately, she agreed to proceed I will call her with test results I continue to advise her to quit smoking

## 2017-03-13 NOTE — Assessment & Plan Note (Signed)
Clinically, she has no evidence of disease progression or end organ damage. I recommend yearly visit with history, physical examination, blood work and defer skeletal survey.

## 2017-03-13 NOTE — Telephone Encounter (Signed)
Gave patient avs and calender for August and September appointment. Per 9/7 los.

## 2017-03-20 ENCOUNTER — Ambulatory Visit (HOSPITAL_COMMUNITY): Payer: Medicare Other

## 2017-03-25 ENCOUNTER — Ambulatory Visit (HOSPITAL_COMMUNITY)
Admission: RE | Admit: 2017-03-25 | Discharge: 2017-03-25 | Disposition: A | Discharge status: Home / Self Care | Payer: Medicare Other | Source: Ambulatory Visit | Attending: Hematology and Oncology | Admitting: Hematology and Oncology

## 2017-03-25 ENCOUNTER — Telehealth: Payer: Self-pay

## 2017-03-25 DIAGNOSIS — Z122 Encounter for screening for malignant neoplasm of respiratory organs: Secondary | ICD-10-CM | POA: Insufficient documentation

## 2017-03-25 DIAGNOSIS — F17210 Nicotine dependence, cigarettes, uncomplicated: Secondary | ICD-10-CM | POA: Insufficient documentation

## 2017-03-25 DIAGNOSIS — I700 Atherosclerosis of aorta: Secondary | ICD-10-CM | POA: Diagnosis not present

## 2017-03-25 DIAGNOSIS — R05 Cough: Secondary | ICD-10-CM | POA: Diagnosis present

## 2017-03-25 DIAGNOSIS — F17200 Nicotine dependence, unspecified, uncomplicated: Secondary | ICD-10-CM | POA: Diagnosis present

## 2017-03-25 NOTE — Telephone Encounter (Signed)
-----   Message from Heath Lark, MD sent at 03/25/2017  3:29 PM EDT ----- Regarding: CT scan pls let her know CT is negative Advise her to stop smoking ----- Message ----- From: Interface, Rad Results In Sent: 03/25/2017   3:18 PM To: Heath Lark, MD

## 2017-03-25 NOTE — Telephone Encounter (Signed)
Called with below message. 

## 2017-07-08 ENCOUNTER — Ambulatory Visit: Payer: Medicare Other | Admitting: Obstetrics and Gynecology

## 2017-07-08 ENCOUNTER — Ambulatory Visit: Payer: Medicare Other | Admitting: Nurse Practitioner

## 2017-07-15 ENCOUNTER — Encounter: Payer: Self-pay | Admitting: Neurology

## 2017-07-15 ENCOUNTER — Ambulatory Visit (INDEPENDENT_AMBULATORY_CARE_PROVIDER_SITE_OTHER): Payer: Medicare Other | Admitting: Neurology

## 2017-07-15 ENCOUNTER — Other Ambulatory Visit: Payer: Self-pay

## 2017-07-15 VITALS — BP 122/64 | HR 74 | Resp 18 | Ht 62.5 in | Wt 158.0 lb

## 2017-07-15 DIAGNOSIS — D472 Monoclonal gammopathy: Secondary | ICD-10-CM | POA: Diagnosis not present

## 2017-07-15 DIAGNOSIS — R531 Weakness: Secondary | ICD-10-CM | POA: Diagnosis not present

## 2017-07-15 DIAGNOSIS — Z9989 Dependence on other enabling machines and devices: Secondary | ICD-10-CM

## 2017-07-15 DIAGNOSIS — G4733 Obstructive sleep apnea (adult) (pediatric): Secondary | ICD-10-CM | POA: Diagnosis not present

## 2017-07-15 MED ORDER — ACETAZOLAMIDE 250 MG PO TABS: 250.0000 mg | ORAL_TABLET | Freq: Two times a day (BID) | ORAL | 12 refills | Status: DC

## 2017-07-15 NOTE — Progress Notes (Signed)
[facility]  PATIENT: ALA KRATZ DOB: 05/03/1956  REFERRING DOCTOR OR PCP:  Dr. Harlan Stains SOURCE: patient, records from Dr. Dema Severin, imaging and lab reports  _________________________________   HISTORICAL  CHIEF COMPLAINT:  Chief Complaint  Patient presents with  . Episodic Weakness    Sts.episodic weakness is completely resolved with Diamox.  Sts. she is compliant with CPAP/fim  . MGUS  . Sleep Apnea    HISTORY OF PRESENT ILLNESS:   [name] is a 62 y.o. woman wih episodes of weakness.  Update 07/15/2017: She has had episodes of periodic weakness. 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     I had started Diamox and she feels that the episodes of weakness had completely resolved. She also avoids high carbohydrate loads.   These episodes used to occur 20 times a month for a couple hours each.     No one else in her family has a similar condition.   She has 2 children.     A second problem is obstructive sleep apnea.    She uses CPAP nightly and feels less sleepy.   She sleeps well most nights with 7 hours of sleep and only 0-1 nocturia.     She has an IgG kappa MGUS and follows Dr. Alvy Bimler.    She denies any numbness or weakness in her feet/hands but notes some numbness in her head.     From 01/08/2017: At the last visit, Diamox was started. She feels that the generalized weakness has improved and is more tolerable.   She was unable to tolerate the potassium that was first tried.   On Diamox, she had had a couple mild episodes but may concern.   The worse episode a few weeks ago only lasted one hour (others lasted up to 8 hours).   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    The KCNJ2 and SCN4A genes were negative.   Periodic weakness    Starting 14 years ago, she began to have spells of weakness.    The first one occurred after driving to work.  When she got to the parking lot she felt her body was very weak and she was trembling trying to support her weight.   She felt her body weighted a thousand pounds.   Her heart felt like it was beating very fast.   For the next 8 hours she was weak and she was taken home to rest.    Since then, she would have episodes every 2 months (1-3 month intervals).   The second episode, she wen to the ER and saw cardiology and she was told everything was ok (EKG, blood, stress test, catheter).  The onset of the episode is very rapid over a couple minutes.   Episodes would last 2 to 9 hours.  Recovery was more gradual over a couple hours most times.    She denied cramps but sometimes her muscles feel like they are burning.      Episodes are mostly in the afternoon about 3 hours after lunch and she notes she is usually just mildly more active before an episode.  She underwent an endocrinology evaluation and rheumatology evaluation and was told everything was normal.   Heavy meals do not trigger an episode.   She wore a glucose  monitor and sugars were mildly high (160) before the episode and mildly low during the episode (70's).   A low carbohydrate, high protein diet has not helped.        Sleep/OSA:   In 2017, she was diagnosed with severe obstructive sleep apnea. She was placed on CPAP. Although sleepiness improved with CPAP, she continued to experience the episodes.  MGUS:  She has MGUS associated with fatigue. There is no evidence of any lytic lesions. She is followed by Dr. Alvy Bimler of [place].   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  She denies numbness.    REVIEW OF SYSTEMS: Constitutional: No fevers, chills, sweats, or change in appetite Eyes: No visual changes, double vision, eye pain Ear, nose and throat: No hearing loss, ear pain, nasal congestion, sore throat Cardiovascular: No chest  pain, palpitations Respiratory: No shortness of breath at rest or with exertion.   No wheezes GastrointestinaI: No nausea, vomiting, diarrhea, abdominal pain, fecal incontinence.  She has GERD Genitourinary: No dysuria, urinary retention or frequency.  No nocturia. Musculoskeletal: No neck pain, back pain Integumentary: No rash, pruritus, skin lesions Neurological: as above Psychiatric: Notes depression and anxiety Endocrine: No palpitations, diaphoresis, change in appetite, change in weigh or increased thirst Hematologic/Lymphatic: No anemia, purpura, petechiae.   She has MGUS Allergic/Immunologic: No itchy/runny eyes, nasal congestion, recent allergic reactions, rashes  ALLERGIES: Allergies  Allergen Reactions  . Hydrocodone Other (See Comments)    Headache  . Percocet [Oxycodone-Acetaminophen] Other (See Comments)    Causes headaches    HOME MEDICATIONS:  Current Outpatient Medications:  .  acetaminophen (TYLENOL) 500 MG tablet, Take 1,000 mg by mouth daily as needed for mild pain. Take 1000 mg daily, Disp: , Rfl:  .  acetaZOLAMIDE (DIAMOX) 250 MG tablet, Take 1 tablet (250 mg total) by mouth 2 (two) times daily., Disp: 60 tablet, Rfl: 12 .  ALPRAZolam (XANAX) 0.5 MG tablet, Take 0.25-0.5 mg by mouth 2 (two) times daily as needed for anxiety. , Disp: , Rfl:  .  aspirin EC 81 MG tablet, Take 81 mg by mouth daily., Disp: , Rfl:  .  Brexpiprazole (REXULTI) 2 MG TABS, Take by mouth at bedtime., Disp: , Rfl:  .  Cholecalciferol (VITAMIN D-3) 5000 UNITS TABS, Take 1 tablet by mouth daily., Disp: , Rfl:  .  Coenzyme Q10 300 MG CAPS, Take 1 capsule by mouth daily., Disp: , Rfl:  .  desvenlafaxine (PRISTIQ) 100 MG 24 hr tablet, Take 100 mg by mouth daily., Disp: , Rfl:  .  fenofibrate 160 MG tablet, Take 160 mg by mouth daily., Disp: , Rfl:  .  ibuprofen (ADVIL,MOTRIN) 600 MG tablet, Take 1 tablet (600 mg total) by mouth every 6 (six) hours as needed (mild pain)., Disp: 30 tablet, Rfl:  0 .  losartan (COZAAR) 50 MG tablet, Take 50 mg by mouth daily., Disp: , Rfl:  .  magnesium oxide (MAG-OX) 400 MG tablet, Take 400 mg by mouth daily., Disp: , Rfl:  .  metoprolol succinate (TOPROL-XL) 100 MG 24 hr tablet, Take 100 mg by mouth daily. Take with or immediately following a meal., Disp: , Rfl:  .  Omega-3 Fatty Acids (FISH OIL PO), Take 500 mg by mouth daily. , Disp: , Rfl:  .  omeprazole (PRILOSEC) 20 MG capsule, Take 20 mg by mouth daily., Disp: , Rfl:  .  tizanidine (ZANAFLEX) 2 MG capsule, Take 2 mg by mouth every 4 (four) hours as needed for muscle spasms., Disp: ,  Rfl:   PAST MEDICAL HISTORY: Past Medical History:  Diagnosis Date  . Cervical disc disease   . Complication of anesthesia    prolonged sedation  . Depression   . GERD (gastroesophageal reflux disease)   . HTN (hypertension)   . Hyperlipidemia   . MGUS (monoclonal gammopathy of unknown significance) 03/15/2015  . Migraine headache   . Mitral valve prolapse   . Monoclonal (M) protein disease, multiple 'M' protein   . Osteoarthritis    generalized  . PONV (postoperative nausea and vomiting)   . Sleep apnea   . Vision abnormalities     PAST SURGICAL HISTORY: Past Surgical History:  Procedure Laterality Date  . ANTERIOR AND POSTERIOR REPAIR N/A 10/16/2015   Procedure: ANTERIOR (CYSTOCELE) AND POSTERIOR REPAIR (RECTOCELE);  Surgeon: Nunzio Cobbs, MD;  Location: [facility];  Service: Gynecology;  Laterality: N/A;  . APPENDECTOMY  1974  . BLADDER SUSPENSION N/A 10/16/2015   Procedure: TRANSVAGINAL TAPE (TVT) PROCEDURE exact midurethral sling;  Surgeon: Nunzio Cobbs, MD;  Location: [facility];  Service: Gynecology;  Laterality: N/A;  . BREAST BIOPSY     negative x 2   . BREAST EXCISIONAL BIOPSY Left 1998  . BREAST EXCISIONAL BIOPSY Right 1986  . BUNIONECTOMY     w/hammer toe repair  . CARPAL TUNNEL RELEASE Right   . CERVICAL DISC SURGERY  6/06  . CYSTO N/A 10/16/2015   Procedure: Kathrene Alu;   Surgeon: Nunzio Cobbs, MD;  Location: [facility];  Service: Gynecology;  Laterality: N/A;  . CYSTOSCOPY N/A 02/26/2016   Procedure: CYSTOSCOPY;  Surgeon: Nunzio Cobbs, MD;  Location: [facility];  Service: Gynecology;  Laterality: N/A;  . KNEE ARTHROSCOPY     right knee x 2  . MENISCUS REPAIR     right knee x 1  . TENS     upper and lower back  . TONSILECTOMY, ADENOIDECTOMY, BILATERAL MYRINGOTOMY AND TUBES    . TOTAL VAGINAL HYSTERECTOMY  05/2004   adenomyosis, prolapse--ovaries remain  . TRANSVAGINAL TAPE (TVT) REMOVAL N/A 02/26/2016   Procedure: Lysis of mid urethral sling, Anterior Colporrhaphy, Cystoscopy ;  Surgeon: Nunzio Cobbs, MD;  Location: [facility];  Service: Gynecology;  Laterality: N/A;  first case/ move first case to follow this. Need 1 hour/   . TUBAL LIGATION      FAMILY HISTORY: Family History  Problem Relation Age of Onset  . Heart failure Mother        congestive  . Diabetes Mother   . Hypertension Mother   . Heart disease Father        MI  . Diabetes Father   . Heart attack Father   . Cancer Brother 5       lung cancer  . Cancer Brother        metastasized, unknown origin  . Colon cancer Paternal Grandmother   . Diabetes Brother   . Breast cancer Cousin   . Breast cancer Cousin     SOCIAL HISTORY:  Social History   Socioeconomic History  . Marital status: Single    Spouse name: Not on file  . Number of children: 2  . Years of education: Not on file  . Highest education level: Not on file  Social Needs  . Financial resource strain: Not on file  . Food insecurity - worry: Not on file  . Food insecurity - inability: Not on file  . Transportation needs -  medical: Not on file  . Transportation needs - non-medical: Not on file  Occupational History    Comment: Intake coordinator  Tobacco Use  . Smoking status: Current Every Day Smoker    Packs/day: 0.50    Years: 35.00    Pack years: 17.50    Types: Cigarettes  .  Smokeless tobacco: Never Used  Substance and Sexual Activity  . Alcohol use: No    Alcohol/week: 0.0 oz  . Drug use: No  . Sexual activity: No    Partners: Female    Birth control/protection: Surgical    Comment: Hyst  Other Topics Concern  . Not on file  Social History Narrative  . Not on file     PHYSICAL EXAM  Vitals:   07/15/17 1126  BP: 122/64  Pulse: 74  Resp: 18  Weight: 158 lb (71.7 kg)  Height: 5' 2.5" (1.588 m)    Body mass index is 28.44 kg/m.   Gen. exam: The head is normocephalic and atraumatic. The neck is nontender. Neck has a good range of motion.  Mental status: She is alert and fully oriented with fluent speech. Memory and attention appear to be appropriate.  Cranial nerves: Extraocular muscles are intact. Pupils react to light and accommodation. Facial strength and sensation is normal. Trapezius strength is normal.   Hearing symmetric  Motor: Muscle bulk is normal. Muscle tone is normal. Strength is 5/5 in the muscles of the arms and legs.  Sensory: She has intact sensation to touch and vibration in the arms and legs  Coordination: Finger to nose is performed well bilaterally.  Gait: The station is normal. The gait and tandem walk are normal for age.    DIAGNOSTIC DATA (LABS, IMAGING, TESTING) - I reviewed patient records, labs, notes, testing and imaging myself where available.  Lab Results  Component Value Date   WBC 8.1 03/06/2017   HGB 12.6 03/06/2017   HCT 39.9 03/06/2017   MCV 93.0 03/06/2017   PLT 404 (H) 03/06/2017      Component Value Date/Time   NA 140 03/06/2017 1027   K 4.5 03/06/2017 1027   CL 105 11/21/2016 0951   CL 103 06/11/2012 1059   CO2 25 03/06/2017 1027   GLUCOSE 111 03/06/2017 1027   GLUCOSE 123 (H) 06/11/2012 1059   BUN 17.2 03/06/2017 1027   CREATININE 1.0 03/06/2017 1027   CALCIUM 10.0 03/06/2017 1027   PROT 7.5 03/06/2017 1027   PROT 7.0 03/06/2017 1027   ALBUMIN 4.0 03/06/2017 1027   AST 14  03/06/2017 1027   ALT 12 03/06/2017 1027   ALKPHOS 60 03/06/2017 1027   BILITOT 0.30 03/06/2017 1027   GFRNONAA 63 11/21/2016 0951   GFRAA 72 11/21/2016 0951       ASSESSMENT AND PLAN  Episodic weakness  MGUS (monoclonal gammopathy of unknown significance)  OSA on CPAP   1.   She will continue Diamox and reduce her carbohydrate intake to try to prevent spells of episodic weakness. 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   The gene variation is of unknown significance.    Increase the dose to 3 pills a day if more spells occur. 2.    Continue CPAP. 3.     Return in 12 months or sooner if there are new or worsening neurologic symptoms   Dinari Stgermaine A. Felecia Shelling, MD, PhD 4/0/9811, 9:14 PM Certified in Neurology, Clinical Neurophysiology, Sleep Medicine, Pain Medicine and Neuroimaging  [facility] [address] [place]  [place], [place] [address] [phone]

## 2017-08-10 ENCOUNTER — Other Ambulatory Visit: Payer: Self-pay | Admitting: Nurse Practitioner

## 2017-08-10 DIAGNOSIS — Z139 Encounter for screening, unspecified: Secondary | ICD-10-CM

## 2017-09-01 ENCOUNTER — Ambulatory Visit (INDEPENDENT_AMBULATORY_CARE_PROVIDER_SITE_OTHER): Payer: Medicare Other | Admitting: Obstetrics and Gynecology

## 2017-09-01 ENCOUNTER — Other Ambulatory Visit: Payer: Self-pay

## 2017-09-01 ENCOUNTER — Encounter: Payer: Self-pay | Admitting: Obstetrics and Gynecology

## 2017-09-01 VITALS — BP 116/60 | HR 58 | Resp 14 | Ht 63.0 in | Wt 157.2 lb

## 2017-09-01 DIAGNOSIS — Z01419 Encounter for gynecological examination (general) (routine) without abnormal findings: Secondary | ICD-10-CM

## 2017-09-01 NOTE — Patient Instructions (Signed)
EXERCISE AND DIET:  We recommended that you start or continue a regular exercise program for good health. Regular exercise means any activity that makes your heart beat faster and makes you sweat.  We recommend exercising at least 30 minutes per day at least 3 days a week, preferably 4 or 5.  We also recommend a diet low in fat and sugar.  Inactivity, poor dietary choices and obesity can cause diabetes, heart attack, stroke, and kidney damage, among others.    ALCOHOL AND SMOKING:  Women should limit their alcohol intake to no more than 7 drinks/beers/glasses of wine (combined, not each!) per week. Moderation of alcohol intake to this level decreases your risk of breast cancer and liver damage. And of course, no recreational drugs are part of a healthy lifestyle.  And absolutely no smoking or even second hand smoke. Most people know smoking can cause heart and lung diseases, but did you know it also contributes to weakening of your bones? Aging of your skin?  Yellowing of your teeth and nails?  CALCIUM AND VITAMIN D:  Adequate intake of calcium and Vitamin D are recommended.  The recommendations for exact amounts of these supplements seem to change often, but generally speaking 600 mg of calcium (either carbonate or citrate) and 800 units of Vitamin D per day seems prudent. Certain women may benefit from higher intake of Vitamin D.  If you are among these women, your doctor will have told you during your visit.    PAP SMEARS:  Pap smears, to check for cervical cancer or precancers,  have traditionally been done yearly, although recent scientific advances have shown that most women can have pap smears less often.  However, every woman still should have a physical exam from her gynecologist every year. It will include a breast check, inspection of the vulva and vagina to check for abnormal growths or skin changes, a visual exam of the cervix, and then an exam to evaluate the size and shape of the uterus and  ovaries.  And after 63 years of age, a rectal exam is indicated to check for rectal cancers. We will also provide age appropriate advice regarding health maintenance, like when you should have certain vaccines, screening for sexually transmitted diseases, bone density testing, colonoscopy, mammograms, etc.   MAMMOGRAMS:  All women over 40 years old should have a yearly mammogram. Many facilities now offer a "3D" mammogram, which may cost around $50 extra out of pocket. If possible,  we recommend you accept the option to have the 3D mammogram performed.  It both reduces the number of women who will be called back for extra views which then turn out to be normal, and it is better than the routine mammogram at detecting truly abnormal areas.    COLONOSCOPY:  Colonoscopy to screen for colon cancer is recommended for all women at age 50.  We know, you hate the idea of the prep.  We agree, BUT, having colon cancer and not knowing it is worse!!  Colon cancer so often starts as a polyp that can be seen and removed at colonscopy, which can quite literally save your life!  And if your first colonoscopy is normal and you have no family history of colon cancer, most women don't have to have it again for 10 years.  Once every ten years, you can do something that may end up saving your life, right?  We will be happy to help you get it scheduled when you are ready.    Be sure to check your insurance coverage so you understand how much it will cost.  It may be covered as a preventative service at no cost, but you should check your particular policy.      Kegel Exercises Kegel exercises help strengthen the muscles that support the rectum, vagina, small intestine, bladder, and uterus. Doing Kegel exercises can help:  Improve bladder and bowel control.  Improve sexual response.  Reduce problems and discomfort during pregnancy.  Kegel exercises involve squeezing your pelvic floor muscles, which are the same muscles you  squeeze when you try to stop the flow of urine. The exercises can be done while sitting, standing, or lying down, but it is best to vary your position. Phase 1 exercises 1. Squeeze your pelvic floor muscles tight. You should feel a tight lift in your rectal area. If you are a female, you should also feel a tightness in your vaginal area. Keep your stomach, buttocks, and legs relaxed. 2. Hold the muscles tight for up to 10 seconds. 3. Relax your muscles. Repeat this exercise 50 times a day or as many times as told by your health care provider. Continue to do this exercise for at least 4-6 weeks or for as long as told by your health care provider. This information is not intended to replace advice given to you by your health care provider. Make sure you discuss any questions you have with your health care provider. Document Released: 06/09/2012 Document Revised: 02/16/2016 Document Reviewed: 05/13/2015 Elsevier Interactive Patient Education  2018 [employer]  

## 2017-09-01 NOTE — Progress Notes (Signed)
62 y.o. [id number] [name] here for annual exam.    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 States that her body was crashing.  She is taking Diamox and this is working well for her.  States she feels like a new person now.  Previously was "ready to give up."  ROS - frequent urination, excessive thirst (taking Diamox), muscle/joint pain.  Does have some leakage of urine - with cough especially per patient. Double voiding. This is an improvement over what she had prior to surgery.  Normal bowel function. Lost 19 pounds through low carb diet.   PCP:   Dr. Harlan Stains Dr. Felecia Shelling - neurology.  Patient's last menstrual period was 03/07/2004 (approximate).           Sexually active: Yes.    The current method of family planning is status post hysterectomy.    Exercising: No.  The patient does not participate in regular exercise at present. Smoker:  yes  Health Maintenance: Pap:  04/21/06 negative  History of abnormal Pap:  no MMG: 09/05/16 density cat B/negative/[facility] 1: [facility].  Has an appointment next week.  Colonoscopy:  2017; Minimal diverticuliti, repeat in 10 years BMD:   09/05/16 Result normal  TDaP:  11/04/08  Pneumococcal and shingles and flu vaccines done.  Screening Labs:  Hb today: PCP, Urine today: not collected    reports that she has been smoking cigarettes.  She has a 17.50 pack-year smoking history. she has never used smokeless tobacco. She reports that she does not drink alcohol or use drugs.  Past Medical History:  Diagnosis Date  . Cervical disc disease   . Complication of anesthesia    prolonged sedation  . Depression   . GERD (gastroesophageal reflux disease)   . HTN (hypertension)   . Hyperlipidemia   . MGUS (monoclonal gammopathy of unknown significance) 03/15/2015  . Migraine headache   . Mitral valve prolapse   . Monoclonal (M) protein disease, multiple 'M' protein   . Osteoarthritis    generalized  . PONV  (postoperative nausea and vomiting)   . Sleep apnea   . Vision abnormalities     Past Surgical History:  Procedure Laterality Date  . ANTERIOR AND POSTERIOR REPAIR N/A 10/16/2015   Procedure: ANTERIOR (CYSTOCELE) AND POSTERIOR REPAIR (RECTOCELE);  Surgeon: Nunzio Cobbs, MD;  Location: [facility];  Service: Gynecology;  Laterality: N/A;  . APPENDECTOMY  1974  . BLADDER SUSPENSION N/A 10/16/2015   Procedure: TRANSVAGINAL TAPE (TVT) PROCEDURE exact midurethral sling;  Surgeon: Nunzio Cobbs, MD;  Location: [facility];  Service: Gynecology;  Laterality: N/A;  . BREAST BIOPSY     negative x 2   . BREAST EXCISIONAL BIOPSY Left 1998  . BREAST EXCISIONAL BIOPSY Right 1986  . BUNIONECTOMY     w/hammer toe repair  . CARPAL TUNNEL RELEASE Right   . CERVICAL DISC SURGERY  6/06  . CYSTO N/A 10/16/2015   Procedure: Kathrene Alu;  Surgeon: Nunzio Cobbs, MD;  Location: [facility];  Service: Gynecology;  Laterality: N/A;  . CYSTOSCOPY N/A 02/26/2016   Procedure: CYSTOSCOPY;  Surgeon: Nunzio Cobbs, MD;  Location: [facility];  Service: Gynecology;  Laterality: N/A;  . KNEE ARTHROSCOPY     right knee x 2  . MENISCUS REPAIR     right knee x 1  . TENS     upper and lower back  . TONSILECTOMY, ADENOIDECTOMY, BILATERAL MYRINGOTOMY  AND TUBES    . TOTAL VAGINAL HYSTERECTOMY  05/2004   adenomyosis, prolapse--ovaries remain  . TRANSVAGINAL TAPE (TVT) REMOVAL N/A 02/26/2016   Procedure: Lysis of mid urethral sling, Anterior Colporrhaphy, Cystoscopy ;  Surgeon: Nunzio Cobbs, MD;  Location: [facility];  Service: Gynecology;  Laterality: N/A;  first case/ move first case to follow this. Need 1 hour/   . TUBAL LIGATION      Current Outpatient Medications  Medication Sig Dispense Refill  . acetaminophen (TYLENOL) 500 MG tablet Take 1,000 mg by mouth daily as needed for mild pain. Take 1000 mg daily    . acetaZOLAMIDE (DIAMOX) 250 MG tablet Take 1 tablet (250 mg total) by mouth 2  (two) times daily. 60 tablet 12  . ALPRAZolam (XANAX) 0.5 MG tablet Take 0.25-0.5 mg by mouth 2 (two) times daily as needed for anxiety.     Marland Kitchen aspirin EC 81 MG tablet Take 81 mg by mouth daily.    . Brexpiprazole (REXULTI) 2 MG TABS Take by mouth at bedtime.    . Cholecalciferol (VITAMIN D-3) 5000 UNITS TABS Take 1 tablet by mouth daily.    . Coenzyme Q10 300 MG CAPS Take 1 capsule by mouth daily.    Marland Kitchen desvenlafaxine (PRISTIQ) 100 MG 24 hr tablet Take 100 mg by mouth daily.    . fenofibrate 160 MG tablet Take 160 mg by mouth daily.    Marland Kitchen ibuprofen (ADVIL,MOTRIN) 600 MG tablet Take 1 tablet (600 mg total) by mouth every 6 (six) hours as needed (mild pain). 30 tablet 0  . losartan (COZAAR) 50 MG tablet Take 50 mg by mouth daily.    . magnesium oxide (MAG-OX) 400 MG tablet Take 400 mg by mouth daily.    . metoprolol succinate (TOPROL-XL) 100 MG 24 hr tablet Take 100 mg by mouth daily. Take with or immediately following a meal.    . Omega-3 Fatty Acids (FISH OIL PO) Take 500 mg by mouth daily.     Marland Kitchen omeprazole (PRILOSEC) 20 MG capsule Take 20 mg by mouth daily.    . tizanidine (ZANAFLEX) 2 MG capsule Take 2 mg by mouth every 4 (four) hours as needed for muscle spasms.     No current facility-administered medications for this visit.     Family History  Problem Relation Age of Onset  . Heart failure Mother        congestive  . Diabetes Mother   . Hypertension Mother   . Heart disease Father        MI  . Diabetes Father   . Heart attack Father   . Cancer Brother 67       lung cancer  . Cancer Brother        metastasized, unknown origin  . Colon cancer Paternal Grandmother   . Diabetes Brother   . Breast cancer Cousin   . Breast cancer Cousin     ROS:  Pertinent items are noted in HPI.  Otherwise, a comprehensive ROS was negative.  Exam:   BP 116/60 (BP Location: Right Arm, Patient Position: Sitting, Cuff Size: Normal)   Pulse (!) 58   Resp 14   Ht 5\' 3"  (1.6 m)   Wt 157 lb 4 oz  (71.3 kg)   LMP 03/07/2004 (Approximate)   BMI 27.86 kg/m     General appearance: alert, cooperative and appears stated age Head: Normocephalic, without obvious abnormality, atraumatic Neck: no adenopathy, supple, symmetrical, trachea midline and thyroid normal to  inspection and palpation Lungs: clear to auscultation bilaterally Breasts: normal appearance, no masses or tenderness, No nipple retraction or dimpling, No nipple discharge or bleeding, No axillary or supraclavicular adenopathy Heart: regular rate and rhythm Abdomen: soft, non-tender; no masses, no organomegaly Extremities: extremities normal, atraumatic, no cyanosis or edema Skin: Skin color, texture, turgor normal. No rashes or lesions Lymph nodes: Cervical, supraclavicular, and axillary nodes normal. No abnormal inguinal nodes palpated Neurologic: Grossly normal  Pelvic: External genitalia:  no lesions              Urethra:  normal appearing urethra with no masses, tenderness or lesions              Bartholins and Skenes: normal                 Vagina: normal appearing vagina with normal color and discharge, no lesions.  Good support.              Cervix:  absent              Pap taken: No. Bimanual Exam:  Uterus:   Absent.              Adnexa: no mass, fullness, tenderness              Rectal exam: Yes.  .  Confirms.              Anus:  normal sphincter tone, no lesions  Chaperone was present for exam.  Assessment:   Well woman visit with normal exam. Status post TVH - fibroids, adenomyosis.  Ovaries remain.  Status post A and P repair with TVT and cysto.  Status post lysis of midurethral sling.  Mild GSI.  MGUS.  FH multiple cancers.  Hx chronic medical issues.  Periodic paralysis. Genetic testing negative. On Diamox.  Plan: Mammogram screening discussed. Recommended self breast awareness. Pap and HR HPV as above. Guidelines for Calcium, Vitamin D, regular exercise program including cardiovascular and weight  bearing exercise. Kegel's.  Follow up annually and prn.   After visit summary provided.

## 2017-09-07 ENCOUNTER — Ambulatory Visit
Admission: RE | Admit: 2017-09-07 | Discharge: 2017-09-07 | Disposition: A | Discharge status: Home / Self Care | Payer: Medicare Other | Source: Ambulatory Visit | Attending: Nurse Practitioner | Admitting: Nurse Practitioner

## 2017-09-07 DIAGNOSIS — Z139 Encounter for screening, unspecified: Secondary | ICD-10-CM

## 2017-10-22 ENCOUNTER — Other Ambulatory Visit: Payer: Self-pay | Admitting: Neurology

## 2018-03-04 ENCOUNTER — Encounter (INDEPENDENT_AMBULATORY_CARE_PROVIDER_SITE_OTHER): Payer: Self-pay | Admitting: Orthopaedic Surgery

## 2018-03-04 ENCOUNTER — Ambulatory Visit (INDEPENDENT_AMBULATORY_CARE_PROVIDER_SITE_OTHER): Payer: Medicare Other | Admitting: Orthopaedic Surgery

## 2018-03-04 ENCOUNTER — Ambulatory Visit (INDEPENDENT_AMBULATORY_CARE_PROVIDER_SITE_OTHER): Payer: Medicare Other

## 2018-03-04 ENCOUNTER — Other Ambulatory Visit: Payer: Self-pay | Admitting: Hematology and Oncology

## 2018-03-04 DIAGNOSIS — D890 Polyclonal hypergammaglobulinemia: Principal | ICD-10-CM

## 2018-03-04 DIAGNOSIS — D472 Monoclonal gammopathy: Secondary | ICD-10-CM

## 2018-03-04 DIAGNOSIS — M25551 Pain in right hip: Secondary | ICD-10-CM

## 2018-03-04 MED ORDER — METHYLPREDNISOLONE ACETATE 40 MG/ML IJ SUSP: 40.0000 mg | Freq: Once | INTRAMUSCULAR | Status: DC

## 2018-03-04 NOTE — Progress Notes (Signed)
Office Visit Note   Patient: [name]           Date of Birth: [birth year]           MRN: [id number] Visit Date: 03/04/2018              Requested by: Harlan Stains, MD [place], [place] [address] PCP: Harlan Stains, MD   Assessment & Plan: Visit Diagnoses:  1. 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  The patient continues to have groin pain and we feel it is appropriate to proceed with a diagnostic and hopefully therapeutic right hip intra-articular cortisone injection.  Dr. Junius Roads will perform this today.  She will follow-up with [place] as needed.  Call if concerns or questions in the meantime.  Follow-Up Instructions: Return if symptoms worsen or fail to improve.   Orders:  Orders Placed This Encounter  Procedures  . XR HIP UNILAT W OR W/O PELVIS 2-3 VIEWS RIGHT  . XR Lumbar Spine 2-3 Views   No orders of the defined types were placed in this encounter.     Procedures: No procedures performed   Clinical Data: No additional findings.   Subjective: Chief Complaint  Patient presents with  . Right Hip - Pain    HPI patient is a pleasant 62 year old female who presents to our clinic today with right lateral hip and groin pain.  This began on 12/28/2017.  She was going down a set of stairs when she missed the last step and jammed her right foot into the ground.  Since then she has had pain to the groin as well as lateral hip.  She was seen by Dr. Delilah [facility] where she was given an injection to the right trochanteric bursa.  This significantly helped her lateral hip pain but not the pain to the groin.  She has increased pain going up stairs as well as sitting, standing, bending or sleeping on the right side.  She does have pain that wakes her up at night.  No numbness, tingling or burning.  She does admit to lumbar problems in the past for which she is undergone epidural  steroid injections.  No bowel or bladder change and no saddle paresthesias.  Review of Systems as detailed in HPI.  All others reviewed and are negative.   Objective: Vital Signs: LMP 03/07/2004 (Approximate)   Physical Exam well-developed well-nourished female no acute distress.  Alert and oriented x3.  Ortho Exam examination of her right lower extremity reveals marked tenderness at the trochanteric bursa.  Negative logroll.  Minimally positive straight leg raise.  Full range of motion without pain to her lumbar spine.  No focal weakness.  She is neurovascularly intact distally.  Specialty Comments:  No specialty comments available.  Imaging: Xr Hip Unilat W Or W/o Pelvis 2-3 Views Right  Result Date: 03/04/2018 Mild to moderate degenerative changes right hip  Xr Lumbar Spine 2-3 Views  Result Date: 03/04/2018 Decreased joint space L4-5 with slight abnormal straightening of the lumbar spine    PMFS History: Patient Active Problem List   Diagnosis Date Noted  . Pain in right hip 03/04/2018  . Episodic weakness 10/08/2016  . Depression 10/08/2016  . OSA on CPAP 10/08/2016  . Status post surgery 10/16/2015  . MGUS (monoclonal gammopathy of unknown significance) 03/15/2015  . Tobacco abuse 03/14/2014  . Monoclonal (M) protein disease, multiple 'M'  protein   . Osteoarthritis   . Mitral valve prolapse   . HTN (hypertension)   . Hyperlipidemia    Past Medical History:  Diagnosis Date  . Cervical disc disease   . Complication of anesthesia    prolonged sedation  . Depression   . GERD (gastroesophageal reflux disease)   . HTN (hypertension)   . Hyperlipidemia   . MGUS (monoclonal gammopathy of unknown significance) 03/15/2015  . Migraine headache   . Mitral valve prolapse   . Monoclonal (M) protein disease, multiple 'M' protein   . Osteoarthritis    generalized  . PONV (postoperative nausea and vomiting)   . Sleep apnea   . Vision abnormalities     Family History    Problem Relation Age of Onset  . Heart failure Mother        congestive  . Diabetes Mother   . Hypertension Mother   . Heart disease Father        MI  . Diabetes Father   . Heart attack Father   . Cancer Brother 58       lung cancer  . Cancer Brother        metastasized, unknown origin  . Colon cancer Paternal Grandmother   . Diabetes Brother   . Breast cancer Cousin   . Breast cancer Cousin     Past Surgical History:  Procedure Laterality Date  . ANTERIOR AND POSTERIOR REPAIR N/A 10/16/2015   Procedure: ANTERIOR (CYSTOCELE) AND POSTERIOR REPAIR (RECTOCELE);  Surgeon: Nunzio Cobbs, MD;  Location: [facility];  Service: Gynecology;  Laterality: N/A;  . APPENDECTOMY  1974  . BLADDER SUSPENSION N/A 10/16/2015   Procedure: TRANSVAGINAL TAPE (TVT) PROCEDURE exact midurethral sling;  Surgeon: Nunzio Cobbs, MD;  Location: [facility];  Service: Gynecology;  Laterality: N/A;  . BREAST BIOPSY     negative x 2   . BREAST EXCISIONAL BIOPSY Left 1998  . BREAST EXCISIONAL BIOPSY Right 1986  . BUNIONECTOMY     w/hammer toe repair  . CARPAL TUNNEL RELEASE Right   . CERVICAL DISC SURGERY  6/06  . CYSTO N/A 10/16/2015   Procedure: Kathrene Alu;  Surgeon: Nunzio Cobbs, MD;  Location: [facility];  Service: Gynecology;  Laterality: N/A;  . CYSTOSCOPY N/A 02/26/2016   Procedure: CYSTOSCOPY;  Surgeon: Nunzio Cobbs, MD;  Location: [facility];  Service: Gynecology;  Laterality: N/A;  . KNEE ARTHROSCOPY     right knee x 2  . MENISCUS REPAIR     right knee x 1  . TENS     upper and lower back  . TONSILECTOMY, ADENOIDECTOMY, BILATERAL MYRINGOTOMY AND TUBES    . TOTAL VAGINAL HYSTERECTOMY  05/2004   adenomyosis, prolapse--ovaries remain  . TRANSVAGINAL TAPE (TVT) REMOVAL N/A 02/26/2016   Procedure: Lysis of mid urethral sling, Anterior Colporrhaphy, Cystoscopy ;  Surgeon: Nunzio Cobbs, MD;  Location: [facility];  Service: Gynecology;  Laterality: N/A;  first case/ move  first case to follow this. Need 1 hour/   . TUBAL LIGATION     Social History   Occupational History    Comment: Intake coordinator  Tobacco Use  . Smoking status: Current Every Day Smoker    Packs/day: 0.50    Years: 35.00    Pack years: 17.50    Types: Cigarettes  . Smokeless tobacco: Never Used  Substance and Sexual Activity  . Alcohol use: No    Alcohol/week:  0.0 standard drinks  . Drug use: No  . Sexual activity: Never    Partners: Female    Birth control/protection: Surgical    Comment: Hyst

## 2018-03-04 NOTE — Progress Notes (Signed)
Procedure: She was referred to me by Dr. Erlinda Hong for a diagnostic/therapeutic right hip injection.  Pain in the hip since June.  Partial relief with greater trochanter injection.  Still having quite a bit of groin pain.  Ultrasound-guided right hip injection: After sterile prep with Betadine, injected 8 cc 1% lidocaine without epinephrine then 40 mg methylprednisolone using a 22-gauge spinal needle passing the needle through the iliofemoral ligament into the femoral head/neck junction.  She had excellent pain relief during the immediate anesthetic phase.  She will follow-up with Dr. Erlinda Hong as directed.

## 2018-03-05 ENCOUNTER — Inpatient Hospital Stay: Payer: Medicare Other

## 2018-03-05 ENCOUNTER — Telehealth: Payer: Self-pay | Admitting: Hematology and Oncology

## 2018-03-05 NOTE — Telephone Encounter (Signed)
Patient left message to cx appointments 8/30 and 9/6. Returned call to reschedule and was unable to reach patient. Appointments cxd - lm and asked that patient return call to reschedule.

## 2018-03-12 ENCOUNTER — Ambulatory Visit: Payer: Self-pay | Admitting: Hematology and Oncology

## 2018-06-18 ENCOUNTER — Encounter

## 2018-07-15 ENCOUNTER — Other Ambulatory Visit: Payer: Self-pay

## 2018-07-15 ENCOUNTER — Ambulatory Visit (INDEPENDENT_AMBULATORY_CARE_PROVIDER_SITE_OTHER): Payer: Medicare Other | Admitting: Neurology

## 2018-07-15 ENCOUNTER — Encounter: Payer: Self-pay | Admitting: Neurology

## 2018-07-15 VITALS — BP 138/70 | HR 62 | Ht 63.0 in | Wt 154.5 lb

## 2018-07-15 DIAGNOSIS — R531 Weakness: Secondary | ICD-10-CM

## 2018-07-15 DIAGNOSIS — G723 Periodic paralysis: Secondary | ICD-10-CM

## 2018-07-15 DIAGNOSIS — D472 Monoclonal gammopathy: Secondary | ICD-10-CM | POA: Diagnosis not present

## 2018-07-15 DIAGNOSIS — G4733 Obstructive sleep apnea (adult) (pediatric): Secondary | ICD-10-CM | POA: Diagnosis not present

## 2018-07-15 DIAGNOSIS — Z9989 Dependence on other enabling machines and devices: Secondary | ICD-10-CM

## 2018-07-15 MED ORDER — ACETAZOLAMIDE 250 MG PO TABS: 250.0000 mg | ORAL_TABLET | Freq: Two times a day (BID) | ORAL | 12 refills | Status: DC

## 2018-07-15 NOTE — Progress Notes (Signed)
[facility]  PATIENT: [name] DOB: 07/14/1955  REFERRING DOCTOR OR PCP:  Dr. Harlan Stains SOURCE: patient, records from Dr. Dema Severin, imaging and lab reports  _________________________________   HISTORICAL  CHIEF COMPLAINT:  Chief Complaint  Patient presents with  . Follow-up    RM 13, alone. Last seen 07/15/2017.  Marland Kitchen MGUS    Taking Diamox 250mg  BID. Episodic weakness has resolved. She is doing well.   . CPAP    Still using every night. No problems. DME: [facility].     HISTORY OF PRESENT ILLNESS:   [name] is a 63 y.o. woman wih episodes of weakness.  Update 07/15/2018: She had had spells of weakness since her 40's occurring multiple days/month (20/30 days month) and last for several hours.    Since starting Diamox she has not had a single spell.  She notes mild dry mouth and mild dry eyes at times.    This is better with eyedrops.   She has slight altered taste (esp with sodas).   No numbness/tingling.   She still notes being more stiff than others in cold weather.   No myotonia.   She feels like a new person since starting Diamox.    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     She also has well controlled MGUS (IgG Kappa).   She denies any neuropathic symptoms.    She has OSA (sees Dr. Maxwell Caul) and she is compliant with CPAP.  She sleeps 7 hours most nights.    Update 07/15/2017: She has had episodes of periodic weakness. 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     I had started Diamox and she feels that the episodes of weakness had completely resolved. She also avoids high carbohydrate loads.   These episodes used to occur 20 times a month for a couple hours each.     No one else in her family has a similar condition.   She has 2 children.     A second problem is obstructive sleep apnea.     She uses CPAP nightly and feels less sleepy.   She sleeps well most nights with 7 hours of sleep and only 0-1 nocturia.     She has an IgG kappa MGUS and follows Dr. Alvy Bimler.    She denies any numbness or weakness in her feet/hands but notes some numbness in her head.     From 01/08/2017: At the last visit, Diamox was started. She feels that the generalized weakness has improved and is more tolerable.   She was unable to tolerate the potassium that was first tried.   On Diamox, she had had a couple mild episodes but may concern.   The worse episode a few weeks ago only lasted one hour (others lasted up to 8 hours).   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 The CACNA1S gene showed a variant of uncertain clinical significance was seen was a [id number] ([id number]) mutation.    The KCNJ2 and SCN4A genes were negative.   Periodic weakness   Starting 14 years ago, she began to have spells of weakness.    The first one occurred after driving to work.  When she got to the parking lot she felt her body was very weak and she was trembling trying to support her weight.   She felt her body  weighted a thousand pounds.   Her heart felt like it was beating very fast.   For the next 8 hours she was weak and she was taken home to rest.    Since then, she would have episodes every 2 months (1-3 month intervals).   The second episode, she wen to the ER and saw cardiology and she was told everything was ok (EKG, blood, stress test, catheter).  The onset of the episode is very rapid over a couple minutes.   Episodes would last 2 to 9 hours.  Recovery was more gradual over a couple hours most times.    She denied cramps but sometimes her muscles feel like they are burning.      Episodes are mostly in the afternoon about 3 hours after lunch and she notes she is usually just mildly more active before an episode.  She underwent an endocrinology evaluation and rheumatology evaluation and was told everything was  normal.   Heavy meals do not trigger an episode.   She wore a glucose monitor and sugars were mildly high (160) before the episode and mildly low during the episode (70's).   A low carbohydrate, high protein diet has not helped.        Sleep/OSA:   In 2017, she was diagnosed with severe obstructive sleep apnea. She was placed on CPAP. Although sleepiness improved with CPAP, she continued to experience the episodes.  MGUS:  She has MGUS associated with fatigue. There is no evidence of any lytic lesions. She is followed by Dr. Alvy Bimler of [place].   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  She denies numbness.    REVIEW OF SYSTEMS: Constitutional: No fevers, chills, sweats, or change in appetite Eyes: No visual changes, double vision, eye pain Ear, nose and throat: No hearing loss, ear pain, nasal congestion, sore throat Cardiovascular: No chest pain, palpitations Respiratory: No shortness of breath at rest or with exertion.   No wheezes GastrointestinaI: No nausea, vomiting, diarrhea, abdominal pain, fecal incontinence.  She has GERD Genitourinary: No dysuria, urinary retention or frequency.  No nocturia. Musculoskeletal: No neck pain, back pain Integumentary: No rash, pruritus, skin lesions Neurological: as above Psychiatric: Notes depression and anxiety Endocrine: No palpitations, diaphoresis, change in appetite, change in weigh or increased thirst Hematologic/Lymphatic: No anemia, purpura, petechiae.   She has MGUS Allergic/Immunologic: No itchy/runny eyes, nasal congestion, recent allergic reactions, rashes  ALLERGIES: Allergies  Allergen Reactions  . Hydrocodone Other (See Comments)    Headache  . Percocet [Oxycodone-Acetaminophen] Other (See Comments)    Causes headaches    HOME MEDICATIONS:  Current Outpatient Medications:  .  acetaminophen (TYLENOL) 500 MG tablet, Take 1,000 mg by mouth daily as needed for mild pain. Take 1000 mg daily, Disp: , Rfl:  .   acetaZOLAMIDE (DIAMOX) 250 MG tablet, Take 1 tablet (250 mg total) by mouth 2 (two) times daily., Disp: 60 tablet, Rfl: 12 .  ALPRAZolam (XANAX) 0.5 MG tablet, Take 0.25-0.5 mg by mouth 2 (two) times daily as needed for anxiety. , Disp: , Rfl:  .  aspirin EC 81 MG tablet, Take 81 mg by mouth daily., Disp: , Rfl:  .  Brexpiprazole (REXULTI) 2 MG TABS, Take by mouth at bedtime., Disp: , Rfl:  .  Cholecalciferol (VITAMIN D-3) 5000 UNITS TABS, Take 1 tablet by mouth daily., Disp: , Rfl:  .  Coenzyme Q10 300 MG CAPS, Take 1 capsule by mouth daily., Disp: , Rfl:  .  desvenlafaxine (PRISTIQ)  100 MG 24 hr tablet, Take 100 mg by mouth daily., Disp: , Rfl:  .  fenofibrate 160 MG tablet, Take 160 mg by mouth daily., Disp: , Rfl:  .  ibuprofen (ADVIL,MOTRIN) 600 MG tablet, Take 1 tablet (600 mg total) by mouth every 6 (six) hours as needed (mild pain)., Disp: 30 tablet, Rfl: 0 .  losartan (COZAAR) 50 MG tablet, Take 50 mg by mouth daily., Disp: , Rfl:  .  magnesium oxide (MAG-OX) 400 MG tablet, Take 400 mg by mouth daily., Disp: , Rfl:  .  metoprolol succinate (TOPROL-XL) 100 MG 24 hr tablet, Take 100 mg by mouth daily. Take with or immediately following a meal., Disp: , Rfl:  .  Omega-3 Fatty Acids (FISH OIL PO), Take 500 mg by mouth daily. , Disp: , Rfl:  .  omeprazole (PRILOSEC) 20 MG capsule, Take 20 mg by mouth daily., Disp: , Rfl:  .  tizanidine (ZANAFLEX) 2 MG capsule, Take 2 mg by mouth every 4 (four) hours as needed for muscle spasms., Disp: , Rfl:   Current Facility-Administered Medications:  .  methylPREDNISolone acetate (DEPO-MEDROL) injection 40 mg, 40 mg, Intra-articular, Once, Hilts, Michael, MD  PAST MEDICAL HISTORY: Past Medical History:  Diagnosis Date  . Cervical disc disease   . Complication of anesthesia    prolonged sedation  . Depression   . GERD (gastroesophageal reflux disease)   . HTN (hypertension)   . Hyperlipidemia   . MGUS (monoclonal gammopathy of unknown significance)  03/15/2015  . Migraine headache   . Mitral valve prolapse   . Monoclonal (M) protein disease, multiple 'M' protein   . Osteoarthritis    generalized  . PONV (postoperative nausea and vomiting)   . Sleep apnea   . Vision abnormalities     PAST SURGICAL HISTORY: Past Surgical History:  Procedure Laterality Date  . ANTERIOR AND POSTERIOR REPAIR N/A 10/16/2015   Procedure: ANTERIOR (CYSTOCELE) AND POSTERIOR REPAIR (RECTOCELE);  Surgeon: Nunzio Cobbs, MD;  Location: [facility];  Service: Gynecology;  Laterality: N/A;  . APPENDECTOMY  1974  . BLADDER SUSPENSION N/A 10/16/2015   Procedure: TRANSVAGINAL TAPE (TVT) PROCEDURE exact midurethral sling;  Surgeon: Nunzio Cobbs, MD;  Location: [facility];  Service: Gynecology;  Laterality: N/A;  . BREAST BIOPSY     negative x 2   . BREAST EXCISIONAL BIOPSY Left 1998  . BREAST EXCISIONAL BIOPSY Right 1986  . BUNIONECTOMY     w/hammer toe repair  . CARPAL TUNNEL RELEASE Right   . CERVICAL DISC SURGERY  6/06  . CYSTO N/A 10/16/2015   Procedure: Kathrene Alu;  Surgeon: Nunzio Cobbs, MD;  Location: [facility];  Service: Gynecology;  Laterality: N/A;  . CYSTOSCOPY N/A 02/26/2016   Procedure: CYSTOSCOPY;  Surgeon: Nunzio Cobbs, MD;  Location: [facility];  Service: Gynecology;  Laterality: N/A;  . KNEE ARTHROSCOPY     right knee x 2  . MENISCUS REPAIR     right knee x 1  . TENS     upper and lower back  . TONSILECTOMY, ADENOIDECTOMY, BILATERAL MYRINGOTOMY AND TUBES    . TOTAL VAGINAL HYSTERECTOMY  05/2004   adenomyosis, prolapse--ovaries remain  . TRANSVAGINAL TAPE (TVT) REMOVAL N/A 02/26/2016   Procedure: Lysis of mid urethral sling, Anterior Colporrhaphy, Cystoscopy ;  Surgeon: Nunzio Cobbs, MD;  Location: [facility];  Service: Gynecology;  Laterality: N/A;  first case/ move first case to follow this. Need  1 hour/   . TUBAL LIGATION      FAMILY HISTORY: Family History  Problem Relation Age of Onset  . Heart  failure Mother        congestive  . Diabetes Mother   . Hypertension Mother   . Heart disease Father        MI  . Diabetes Father   . Heart attack Father   . Cancer Brother 34       lung cancer  . Cancer Brother        metastasized, unknown origin  . Colon cancer Paternal Grandmother   . Diabetes Brother   . Breast cancer Cousin   . Breast cancer Cousin     SOCIAL HISTORY:  Social History   Socioeconomic History  . Marital status: Single    Spouse name: Not on file  . Number of children: 2  . Years of education: Not on file  . Highest education level: Not on file  Occupational History    Comment: Intake coordinator  Social Needs  . Financial resource strain: Not on file  . Food insecurity:    Worry: Not on file    Inability: Not on file  . Transportation needs:    Medical: Not on file    Non-medical: Not on file  Tobacco Use  . Smoking status: Current Every Day Smoker    Packs/day: 0.50    Years: 35.00    Pack years: 17.50    Types: Cigarettes  . Smokeless tobacco: Never Used  Substance and Sexual Activity  . Alcohol use: No    Alcohol/week: 0.0 standard drinks  . Drug use: No  . Sexual activity: Never    Partners: Female    Birth control/protection: Surgical    Comment: Hyst  Lifestyle  . Physical activity:    Days per week: Not on file    Minutes per session: Not on file  . Stress: Not on file  Relationships  . Social connections:    Talks on phone: Not on file    Gets together: Not on file    Attends religious service: Not on file    Active member of club or organization: Not on file    Attends meetings of clubs or organizations: Not on file    Relationship status: Not on file  . Intimate partner violence:    Fear of current or ex partner: Not on file    Emotionally abused: Not on file    Physically abused: Not on file    Forced sexual activity: Not on file  Other Topics Concern  . Not on file  Social History Narrative  . Not on file      PHYSICAL EXAM  Vitals:   07/15/18 1137  BP: 138/70  Pulse: 62  Weight: 154 lb 8 oz (70.1 kg)  Height: 5\' 3"  (1.6 m)    Body mass index is 27.37 kg/m.   Gen. exam: The head is normocephalic and atraumatic. The neck is nontender. Neck has a good range of motion.  Mental status: She is alert and fully oriented with fluent speech. Memory and attention appear to be appropriate.  Cranial nerves: Extraocular muscles are intact. Pupils react to light and accommodation. Facial strength and sensation is normal. Trapezius strength is normal.   Hearing symmetric  Motor: Muscle bulk is normal. Muscle tone is normal.  Strength is 5/5 in the arms and legs.  No myotonia.  Sensory: She has intact sensation to touch and vibration  in the arms and legs  Coordination: Finger to nose is performed well bilaterally.  Gait: The station is normal. The gait and tandem walk are normal for age.  DTRs: Normal and symmetric in arms and knees    DIAGNOSTIC DATA (LABS, IMAGING, TESTING) - I reviewed patient records, labs, notes, testing and imaging myself where available.  Lab Results  Component Value Date   WBC 8.1 03/06/2017   HGB 12.6 03/06/2017   HCT 39.9 03/06/2017   MCV 93.0 03/06/2017   PLT 404 (H) 03/06/2017      Component Value Date/Time   NA 140 03/06/2017 1027   K 4.5 03/06/2017 1027   CL 105 11/21/2016 0951   CL 103 06/11/2012 1059   CO2 25 03/06/2017 1027   GLUCOSE 111 03/06/2017 1027   GLUCOSE 123 (H) 06/11/2012 1059   BUN 17.2 03/06/2017 1027   CREATININE 1.0 03/06/2017 1027   CALCIUM 10.0 03/06/2017 1027   PROT 7.5 03/06/2017 1027   PROT 7.0 03/06/2017 1027   ALBUMIN 4.0 03/06/2017 1027   AST 14 03/06/2017 1027   ALT 12 03/06/2017 1027   ALKPHOS 60 03/06/2017 1027   BILITOT 0.30 03/06/2017 1027   GFRNONAA 63 11/21/2016 0951   GFRAA 72 11/21/2016 0951       ASSESSMENT AND PLAN  Episodic weakness  Periodic paralysis  MGUS (monoclonal gammopathy of  unknown significance)  OSA on CPAP   1.   Continue Diamox and low carbohydrate intake.   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   The gene variation is of unknown significance.    Increase the dose to 3 pills a day if more spells occur. 2.    Continue CPAP. 3.     Return in 12 months or sooner if there are new or worsening neurologic symptoms.     Can alternate between seeing me and a nurse practitioner   Richard A. Felecia Shelling, MD, PhD 09/09/6292, 76:54 PM Certified in Neurology, Clinical Neurophysiology, Sleep Medicine, Pain Medicine and Neuroimaging  [facility] [address], [place], [place] [address] [phone]

## 2018-07-23 ENCOUNTER — Other Ambulatory Visit: Payer: Self-pay | Admitting: Acute Care

## 2018-07-23 DIAGNOSIS — F17210 Nicotine dependence, cigarettes, uncomplicated: Principal | ICD-10-CM

## 2018-07-23 DIAGNOSIS — Z87891 Personal history of nicotine dependence: Secondary | ICD-10-CM

## 2018-07-23 DIAGNOSIS — Z122 Encounter for screening for malignant neoplasm of respiratory organs: Secondary | ICD-10-CM

## 2018-08-18 ENCOUNTER — Encounter: Payer: Self-pay | Admitting: Acute Care

## 2018-08-18 ENCOUNTER — Ambulatory Visit (INDEPENDENT_AMBULATORY_CARE_PROVIDER_SITE_OTHER): Payer: Medicare Other | Admitting: Acute Care

## 2018-08-18 ENCOUNTER — Ambulatory Visit (INDEPENDENT_AMBULATORY_CARE_PROVIDER_SITE_OTHER)
Admission: RE | Admit: 2018-08-18 | Discharge: 2018-08-18 | Disposition: A | Discharge status: Home / Self Care | Payer: Medicare Other | Source: Ambulatory Visit | Attending: Acute Care | Admitting: Acute Care

## 2018-08-18 VITALS — BP 116/60 | HR 56 | Ht 62.5 in | Wt 154.6 lb

## 2018-08-18 DIAGNOSIS — Z87891 Personal history of nicotine dependence: Secondary | ICD-10-CM | POA: Diagnosis not present

## 2018-08-18 DIAGNOSIS — F17210 Nicotine dependence, cigarettes, uncomplicated: Secondary | ICD-10-CM

## 2018-08-18 DIAGNOSIS — Z122 Encounter for screening for malignant neoplasm of respiratory organs: Secondary | ICD-10-CM

## 2018-08-18 NOTE — Progress Notes (Signed)
Shared Decision Making Visit Lung Cancer Screening Program [phone])   Eligibility:  Age 63 y.o.  Pack Years Smoking History Calculation 57 pack year smoking history (# packs/per year x # years smoked)  Recent History of coughing up blood  no  Unexplained weight loss? no ( >Than 15 pounds within the last 6 months )  Prior History Lung / other cancer no (Diagnosis within the last 5 years already requiring surveillance chest CT Scans).  Smoking Status Current Smoker  Former Smokers: Years since quit: NA  Quit Date: NA  Visit Components:  Discussion included one or more decision making aids. yes  Discussion included risk/benefits of screening. yes  Discussion included potential follow up diagnostic testing for abnormal scans. yes  Discussion included meaning and risk of over diagnosis. yes  Discussion included meaning and risk of False Positives. yes  Discussion included meaning of total radiation exposure. yes  Counseling Included:  Importance of adherence to annual lung cancer LDCT screening. yes  Impact of comorbidities on ability to participate in the program. yes  Ability and willingness to under diagnostic treatment. yes  Smoking Cessation Counseling:  Current Smokers:   Discussed importance of smoking cessation. yes  Information about tobacco cessation classes and interventions provided to patient. yes  Patient provided with "ticket" for LDCT Scan. yes  Symptomatic Patient. no  Counseling  Diagnosis Code: Tobacco Use Z72.0  Asymptomatic Patient yes  Counseling (Intermediate counseling: > three minutes counseling) [id number]  Former Smokers:   Discussed the importance of maintaining cigarette abstinence. yes  Diagnosis Code: Personal History of Nicotine Dependence. N62.952  Information about tobacco cessation classes and interventions provided to patient. Yes  Patient provided with "ticket" for LDCT Scan. yes  Written Order for Lung Cancer  Screening with LDCT placed in Epic. Yes (CT Chest Lung Cancer Screening Low Dose W/O CM) [id number] Z12.2-Screening of respiratory organs Z87.891-Personal history of nicotine dependence  I have spent 25 minutes of face to face time with Ms. Oguin  discussing the risks and benefits of lung cancer screening. We viewed a power point together that explained in detail the above noted topics. We paused at intervals to allow for questions to be asked and answered to ensure understanding.We discussed that the single most powerful action that she can take to decrease her risk of developing lung cancer is to quit smoking. We discussed whether or not she is ready to commit to setting a quit date. We discussed options for tools to aid in quitting smoking including nicotine replacement therapy, non-nicotine medications, support groups, Quit Smart classes, and behavior modification. We discussed that often times setting smaller, more achievable goals, such as eliminating 1 cigarette a day for a week and then 2 cigarettes a day for a week can be helpful in slowly decreasing the number of cigarettes smoked. This allows for a sense of accomplishment as well as providing a clinical benefit. I gave her the " Be Stronger Than Your Excuses" card with contact information for community resources, classes, free nicotine replacement therapy, and access to mobile apps, text messaging, and on-line smoking cessation help. I have also given her my card and contact information in the event she needs to contact me. We discussed the time and location of the scan, and that either Doroteo Glassman RN or I will call with the results within 24-48 hours of receiving them. I have offered her  a copy of the power point we viewed  as a resource in the event they need reinforcement  of the concepts we discussed today in the office. The patient verbalized understanding of all of  the above and had no further questions upon leaving the office. They have my  contact information in the event they have any further questions.  I spent 3 minutes counseling on smoking cessation and the health risks of continued tobacco abuse. We discussed reduce to quit as an option for reducing her daily smoking with the ultimate goal of becoming smoke free.  I explained to the patient that there has been a high incidence of coronary artery disease noted on these exams. I explained that this is a non-gated exam therefore degree or severity cannot be determined. This patient is not on statin therapy. I have asked the patient to follow-up with their PCP regarding any incidental finding of coronary artery disease and management with diet or medication as their PCP  feels is clinically indicated. The patient verbalized understanding of the above and had no further questions upon completion of the visit.      Magdalen Spatz, NP 08/18/2018 11:58 AM

## 2018-08-19 ENCOUNTER — Other Ambulatory Visit: Payer: Self-pay | Admitting: Acute Care

## 2018-08-19 DIAGNOSIS — Z122 Encounter for screening for malignant neoplasm of respiratory organs: Secondary | ICD-10-CM

## 2018-08-19 DIAGNOSIS — F17210 Nicotine dependence, cigarettes, uncomplicated: Principal | ICD-10-CM

## 2018-08-19 DIAGNOSIS — Z87891 Personal history of nicotine dependence: Secondary | ICD-10-CM

## 2018-09-06 ENCOUNTER — Ambulatory Visit: Payer: Medicare Other | Admitting: Obstetrics and Gynecology

## 2018-09-13 ENCOUNTER — Encounter: Payer: Self-pay | Admitting: Obstetrics and Gynecology

## 2018-09-13 ENCOUNTER — Other Ambulatory Visit: Payer: Self-pay

## 2018-09-13 ENCOUNTER — Ambulatory Visit (INDEPENDENT_AMBULATORY_CARE_PROVIDER_SITE_OTHER): Payer: Medicare Other | Admitting: Obstetrics and Gynecology

## 2018-09-13 ENCOUNTER — Other Ambulatory Visit: Payer: Self-pay | Admitting: Nurse Practitioner

## 2018-09-13 VITALS — BP 122/64 | HR 60 | Resp 16 | Ht 62.5 in | Wt 152.0 lb

## 2018-09-13 DIAGNOSIS — Z23 Encounter for immunization: Secondary | ICD-10-CM

## 2018-09-13 DIAGNOSIS — Z1231 Encounter for screening mammogram for malignant neoplasm of breast: Secondary | ICD-10-CM

## 2018-09-13 DIAGNOSIS — R350 Frequency of micturition: Secondary | ICD-10-CM

## 2018-09-13 DIAGNOSIS — Z01419 Encounter for gynecological examination (general) (routine) without abnormal findings: Secondary | ICD-10-CM | POA: Diagnosis not present

## 2018-09-13 LAB — POCT URINALYSIS DIPSTICK
Bilirubin, UA: NEGATIVE
Blood, UA: NEGATIVE
GLUCOSE UA: NEGATIVE
Ketones, UA: NEGATIVE
LEUKOCYTES UA: NEGATIVE
Nitrite, UA: NEGATIVE
Protein, UA: NEGATIVE
Urobilinogen, UA: 0.2 E.U./dL
pH, UA: 5 (ref 5.0–8.0)

## 2018-09-13 NOTE — Patient Instructions (Signed)

## 2018-09-13 NOTE — Progress Notes (Signed)
63 y.o. [id number] [name] here for annual exam.    Patient complains of decrease sexual interest. Taking Rexalti and thinks it coincides with this.  She and her partner both have this and are [place] with it.   Urinary frequency.  Takes Diamox.  Good urinary control with laugh, cough.   Bowel function is good.   Urine Dip:Neg  PCP: Harlan Stains, MD  Patient's last menstrual period was 03/07/2004 (approximate).           Sexually active: No. Female The current method of family planning is status post hysterectomy.    Exercising: No.  The patient does not participate in regular exercise at present. Smoker:  Yes, smokes 1/2 ppd  Health Maintenance: Pap: 04-21-06 Neg History of abnormal Pap:  no MMG: 09-07-17 3D Neg/density B/BiRads1--Appt. 10-11-18 Colonoscopy:  2017; Minimal diverticuliti, repeat in 10 years BMD: 09-05-16  Result :normal TDaP: 11-04-08 Gardasil:   no HIV: 04-07-17 NR Hep C: 04-07-17 Neg Screening Labs:  Hb today: PCP Flu vaccine done in November.  Pneumococcal vaccine:  Done.  Will do Shingrix with PCP.    reports that she has been smoking cigarettes. She has a 24.00 pack-year smoking history. She has never used smokeless tobacco. She reports that she does not drink alcohol or use drugs.  Past Medical History:  Diagnosis Date  . Cervical disc disease   . Complication of anesthesia    prolonged sedation  . Depression   . GERD (gastroesophageal reflux disease)   . HTN (hypertension)   . Hyperlipidemia   . MGUS (monoclonal gammopathy of unknown significance) 03/15/2015  . Migraine headache   . Mitral valve prolapse   . Monoclonal (M) protein disease, multiple 'M' protein   . Osteoarthritis    generalized  . PONV (postoperative nausea and vomiting)   . Sleep apnea   . Vision abnormalities     Past Surgical History:  Procedure Laterality Date  . ANTERIOR AND POSTERIOR REPAIR N/A 10/16/2015   Procedure: ANTERIOR (CYSTOCELE) AND POSTERIOR REPAIR  (RECTOCELE);  Surgeon: Nunzio Cobbs, MD;  Location: [facility];  Service: Gynecology;  Laterality: N/A;  . APPENDECTOMY  1974  . BLADDER SUSPENSION N/A 10/16/2015   Procedure: TRANSVAGINAL TAPE (TVT) PROCEDURE exact midurethral sling;  Surgeon: Nunzio Cobbs, MD;  Location: [facility];  Service: Gynecology;  Laterality: N/A;  . BREAST BIOPSY     negative x 2   . BREAST EXCISIONAL BIOPSY Left 1998  . BREAST EXCISIONAL BIOPSY Right 1986  . BUNIONECTOMY     w/hammer toe repair  . CARPAL TUNNEL RELEASE Right   . CERVICAL DISC SURGERY  6/06  . CYSTO N/A 10/16/2015   Procedure: Kathrene Alu;  Surgeon: Nunzio Cobbs, MD;  Location: [facility];  Service: Gynecology;  Laterality: N/A;  . CYSTOSCOPY N/A 02/26/2016   Procedure: CYSTOSCOPY;  Surgeon: Nunzio Cobbs, MD;  Location: [facility];  Service: Gynecology;  Laterality: N/A;  . KNEE ARTHROSCOPY     right knee x 2  . MENISCUS REPAIR     right knee x 1  . TENS     upper and lower back  . TONSILECTOMY, ADENOIDECTOMY, BILATERAL MYRINGOTOMY AND TUBES    . TOTAL VAGINAL HYSTERECTOMY  05/2004   adenomyosis, prolapse--ovaries remain  . TRANSVAGINAL TAPE (TVT) REMOVAL N/A 02/26/2016   Procedure: Lysis of mid urethral sling, Anterior Colporrhaphy, Cystoscopy ;  Surgeon: Nunzio Cobbs, MD;  Location: [facility];  Service: Gynecology;  Laterality: N/A;  first case/ move first case to follow this. Need 1 hour/   . TUBAL LIGATION      Current Outpatient Medications  Medication Sig Dispense Refill  . acetaminophen (TYLENOL) 500 MG tablet Take 1,000 mg by mouth daily as needed for mild pain. Take 1000 mg daily    . acetaZOLAMIDE (DIAMOX) 250 MG tablet Take 1 tablet (250 mg total) by mouth 2 (two) times daily. 60 tablet 12  . ALPRAZolam (XANAX) 0.5 MG tablet Take 0.25-0.5 mg by mouth 2 (two) times daily as needed for anxiety.     Marland Kitchen aspirin EC 81 MG tablet Take 81 mg by mouth daily.    . Brexpiprazole (REXULTI) 2 MG TABS Take by  mouth at bedtime.    . Cholecalciferol (VITAMIN D-3) 5000 UNITS TABS Take 1 tablet by mouth daily.    . Coenzyme Q10 300 MG CAPS Take 1 capsule by mouth daily.    Marland Kitchen desvenlafaxine (PRISTIQ) 100 MG 24 hr tablet Take 100 mg by mouth daily.    . fenofibrate 160 MG tablet Take 160 mg by mouth daily.    Marland Kitchen ibuprofen (ADVIL,MOTRIN) 600 MG tablet Take 1 tablet (600 mg total) by mouth every 6 (six) hours as needed (mild pain). 30 tablet 0  . losartan (COZAAR) 50 MG tablet Take 50 mg by mouth daily.    . magnesium oxide (MAG-OX) 400 MG tablet Take 400 mg by mouth daily.    . metoprolol succinate (TOPROL-XL) 100 MG 24 hr tablet Take 100 mg by mouth daily. Take with or immediately following a meal.    . Omega-3 Fatty Acids (FISH OIL PO) Take 500 mg by mouth daily.     Marland Kitchen omeprazole (PRILOSEC) 20 MG capsule Take 20 mg by mouth daily.    . tizanidine (ZANAFLEX) 2 MG capsule Take 2 mg by mouth every 4 (four) hours as needed for muscle spasms.     Current Facility-Administered Medications  Medication Dose Route Frequency Provider Last Rate Last Dose  . methylPREDNISolone acetate (DEPO-MEDROL) injection 40 mg  40 mg Intra-articular Once Hilts, Michael, MD        Family History  Problem Relation Age of Onset  . Heart failure Mother        congestive  . Diabetes Mother   . Hypertension Mother   . Heart disease Father        MI  . Diabetes Father   . Heart attack Father   . Cancer Brother 36       lung cancer  . Cancer Brother        metastasized, unknown origin  . Colon cancer Paternal Grandmother   . Diabetes Brother   . Breast cancer Cousin   . Breast cancer Cousin     Review of Systems  Genitourinary: Positive for frequency.  Musculoskeletal:       Muscle and joint aches  All other systems reviewed and are negative.   Exam:   BP 122/64 (BP Location: Right Arm, Patient Position: Sitting, Cuff Size: Normal)   Pulse 60   Resp 16   Ht 5' 2.5" (1.588 m)   Wt 152 lb (68.9 kg)   LMP  03/07/2004 (Approximate)   BMI 27.36 kg/m     General appearance: alert, cooperative and appears stated age Head: Normocephalic, without obvious abnormality, atraumatic Neck: no adenopathy, supple, symmetrical, trachea midline and thyroid normal to inspection and palpation Lungs: clear to auscultation bilaterally Breasts: normal appearance, no masses  or tenderness, No nipple retraction or dimpling, No nipple discharge or bleeding, No axillary or supraclavicular adenopathy Heart: regular rate and rhythm Abdomen: soft, non-tender; no masses, no organomegaly Extremities: extremities normal, atraumatic, no cyanosis or edema Skin: Skin color, texture, turgor normal. No rashes or lesions Lymph nodes: Cervical, supraclavicular, and axillary nodes normal. No abnormal inguinal nodes palpated Neurologic: Grossly normal  Pelvic: External genitalia:  no lesions              Urethra:  normal appearing urethra with no masses, tenderness or lesions              Bartholins and Skenes: normal                 Vagina: normal appearing vagina with normal color and discharge, no lesions.  Some scar at vaginal apex.  Good vaginal support and urethral support.              Cervix:  absent              Pap taken: No. Bimanual Exam:  Uterus:  Absent.                Adnexa: no mass, fullness, tenderness              Rectal exam: Yes.  .  Confirms.              Anus:  normal sphincter tone, no lesions  Chaperone was present for exam.  Assessment:   Well woman visit with normal exam. Status post TVH - fibroids, adenomyosis.  Ovaries remain.  Status post A and P repair with TVT and cysto.  Status post lysis of midurethral sling.  Mild GSI.  MGUS.  FH multiple cancers.  Hx chronic medical issues.  Periodic paralysis. Genetic testing negative.  Decreased libido.  Smoker.  Declines cessation.   Plan: Mammogram screening. Recommended self breast awareness. Pap and HR HPV as above. Guidelines for Calcium,  Vitamin D, regular exercise program including cardiovascular and weight bearing exercise. TDap.  Labs with PCP. Follow up annually and prn.   After visit summary provided.

## 2018-10-11 ENCOUNTER — Ambulatory Visit: Payer: Self-pay

## 2018-10-14 ENCOUNTER — Other Ambulatory Visit: Payer: Self-pay | Admitting: Obstetrics and Gynecology

## 2018-10-14 DIAGNOSIS — Z1231 Encounter for screening mammogram for malignant neoplasm of breast: Secondary | ICD-10-CM

## 2019-02-07 ENCOUNTER — Inpatient Hospital Stay
Admission: RE | Admit: 2019-02-07 | Discharge: 2019-02-07 | Disposition: A | Discharge status: Home / Self Care | Payer: Medicare Other | Source: Ambulatory Visit | Attending: Nurse Practitioner | Admitting: Nurse Practitioner

## 2019-02-07 ENCOUNTER — Other Ambulatory Visit: Payer: Self-pay

## 2019-02-07 DIAGNOSIS — Z1231 Encounter for screening mammogram for malignant neoplasm of breast: Secondary | ICD-10-CM

## 2019-04-26 HISTORY — PX: CT CTA CORONARY W/CA SCORE W/CM &/OR WO/CM: HXRAD787

## 2019-05-17 HISTORY — PX: TRANSTHORACIC ECHOCARDIOGRAM: SHX275

## 2019-07-21 ENCOUNTER — Other Ambulatory Visit: Payer: Self-pay

## 2019-07-21 ENCOUNTER — Encounter: Payer: Self-pay | Admitting: Neurology

## 2019-07-21 ENCOUNTER — Ambulatory Visit (INDEPENDENT_AMBULATORY_CARE_PROVIDER_SITE_OTHER): Payer: Medicare Other | Admitting: Neurology

## 2019-07-21 VITALS — BP 110/70 | HR 58 | Temp 97.6°F | Ht 62.5 in | Wt 158.5 lb

## 2019-07-21 DIAGNOSIS — G4733 Obstructive sleep apnea (adult) (pediatric): Secondary | ICD-10-CM

## 2019-07-21 DIAGNOSIS — R531 Weakness: Secondary | ICD-10-CM | POA: Diagnosis not present

## 2019-07-21 DIAGNOSIS — D472 Monoclonal gammopathy: Secondary | ICD-10-CM | POA: Diagnosis not present

## 2019-07-21 DIAGNOSIS — Z9989 Dependence on other enabling machines and devices: Secondary | ICD-10-CM

## 2019-07-21 DIAGNOSIS — G723 Periodic paralysis: Secondary | ICD-10-CM | POA: Diagnosis not present

## 2019-07-21 MED ORDER — ACETAZOLAMIDE 250 MG PO TABS: 250.0000 mg | ORAL_TABLET | Freq: Two times a day (BID) | ORAL | 12 refills | Status: DC

## 2019-07-21 NOTE — Progress Notes (Signed)
[facility]  PATIENT: [name] DOB: 1956/02/10  REFERRING DOCTOR OR PCP:  Dr. Harlan Stains SOURCE: patient, records from Dr. Dema Severin, imaging and lab reports  _________________________________   HISTORICAL  CHIEF COMPLAINT:  Chief Complaint  Patient presents with  . Follow-up    RM 12, alone. Last seen 07/15/2018.   Marland Kitchen Sleep Apnea    DME: [facility]. Uses CPAP. Using every night.  Marland Kitchen MGUS    Taking Diamox 250mg  BID. She is doing well.    HISTORY OF PRESENT ILLNESS:   [name] is a 63 y.o. woman wih episodes of weakness.  Update 07/21/2019: She has periodic paralysis.   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   She has no family history.  We initiated Diamox in 2018 and she has not had a single major spell since starting (was having 3-5 spells of weakness a week lasting hours at a time).    She has had a couple spells of minimal weakness lasting 5 minutes or so.   These may or may not be related.   She has become more lax with her low carbohydrate diet as it has not seemed to affect the PP.      She has IgG kappa MGUS (diagnosed in 2013) and sees Dr. Alvy Bimler.   Her M-spike was 0.3 in 2018. She had a BM biopsy and bone survey at diagnosis.    She has not had much numbness though she gets a needles sensation in her heels at times.  Balance is fine and she does not need to hold on to the wall when eyes closed (I.e shampooing hair).   She plans on making an appointment to see him soon.  Bone survey in 2017 showed stable lytic inferior pubic rami and stable subtle skull lucencies.    She has had issues with depression and anxiety and is on Pristiq and Rexulti (sees Rose Fillers)  Update 07/15/2018: She had had spells of weakness since her 40's occurring multiple days/month (20/30 days month) and last for several hours.    Since starting Diamox she has not had a  single spell.  She notes mild dry mouth and mild dry eyes at times.    This is better with eyedrops.   She has slight altered taste (esp with sodas).   No numbness/tingling.   She still notes being more stiff than others in cold weather.   No myotonia.   She feels like a new person since starting Diamox.    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     She also has well controlled MGUS (IgG Kappa).   She denies any neuropathic symptoms.    She has OSA (sees Dr. Maxwell Caul) and she is compliant with CPAP.  She sleeps 7 hours most nights.    Update 07/15/2017: She has had episodes of periodic weakness. 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     I had started Diamox and she feels that the episodes of weakness had completely resolved. She also avoids high carbohydrate loads.   These episodes used to occur 20 times a month for a couple hours each.     No one else in her family has a similar condition.   She has 2 children.     A second problem is  obstructive sleep apnea.    She uses CPAP nightly and feels less sleepy.   She sleeps well most nights with 7 hours of sleep and only 0-1 nocturia.     She has an IgG kappa MGUS and follows Dr. Alvy Bimler.    She denies any numbness or weakness in her feet/hands but notes some numbness in her head.     From 01/08/2017: At the last visit, Diamox was started. She feels that the generalized weakness has improved and is more tolerable.   She was unable to tolerate the potassium that was first tried.   On Diamox, she had had a couple mild episodes but may concern.   The worse episode a few weeks ago only lasted one hour (others lasted up to 8 hours).   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     The KCNJ2 and SCN4A genes were negative.   Periodic weakness   Starting 14 years ago, she began to have spells of weakness.    The first one occurred after driving to work.  When she got to the parking lot she felt her body was very weak and she was trembling trying to support her weight.   She felt her body weighted a thousand pounds.   Her heart felt like it was beating very fast.   For the next 8 hours she was weak and she was taken home to rest.    Since then, she would have episodes every 2 months (1-3 month intervals).   The second episode, she wen to the ER and saw cardiology and she was told everything was ok (EKG, blood, stress test, catheter).  The onset of the episode is very rapid over a couple minutes.   Episodes would last 2 to 9 hours.  Recovery was more gradual over a couple hours most times.    She denied cramps but sometimes her muscles feel like they are burning.      Episodes are mostly in the afternoon about 3 hours after lunch and she notes she is usually just mildly more active before an episode.  She underwent an endocrinology evaluation and rheumatology evaluation and was told everything was normal.   Heavy meals do not trigger an episode.   She wore a glucose monitor and sugars were mildly high (160) before the episode and mildly low during the episode (70's).   A low carbohydrate, high protein diet has not helped.        Sleep/OSA:   In 2017, she was diagnosed with severe obstructive sleep apnea. She was placed on CPAP. Although sleepiness improved with CPAP, she continued to experience the episodes.  MGUS:  She has MGUS associated with fatigue. There is no evidence of any lytic lesions. She is followed by Dr. Alvy Bimler of [facility].   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  She denies numbness.    REVIEW OF SYSTEMS: Constitutional: No fevers, chills, sweats, or change in appetite Eyes: No visual changes, double vision, eye pain Ear, nose and throat: No hearing loss, ear  pain, nasal congestion, sore throat Cardiovascular: No chest pain, palpitations Respiratory: No shortness of breath at rest or with exertion.   No wheezes GastrointestinaI: No nausea, vomiting, diarrhea, abdominal pain, fecal incontinence.  She has GERD Genitourinary: No dysuria, urinary retention or frequency.  No nocturia. Musculoskeletal: No neck pain, back pain Integumentary:  No rash, pruritus, skin lesions Neurological: as above Psychiatric: Notes depression and anxiety Endocrine: No palpitations, diaphoresis, change in appetite, change in weigh or increased thirst Hematologic/Lymphatic: No anemia, purpura, petechiae.   She has MGUS Allergic/Immunologic: No itchy/runny eyes, nasal congestion, recent allergic reactions, rashes  ALLERGIES: Allergies  Allergen Reactions  . Hydrocodone Other (See Comments)    Headache  . Percocet [Oxycodone-Acetaminophen] Other (See Comments)    Causes headaches    HOME MEDICATIONS:  Current Outpatient Medications:  .  acetaminophen (TYLENOL) 500 MG tablet, Take 1,000 mg by mouth daily as needed for mild pain. Take 1000 mg daily, Disp: , Rfl:  .  acetaZOLAMIDE (DIAMOX) 250 MG tablet, Take 1 tablet (250 mg total) by mouth 2 (two) times daily., Disp: 60 tablet, Rfl: 12 .  ALPRAZolam (XANAX) 0.5 MG tablet, Take 0.25-0.5 mg by mouth 2 (two) times daily as needed for anxiety. , Disp: , Rfl:  .  aspirin EC 81 MG tablet, Take 81 mg by mouth daily., Disp: , Rfl:  .  Brexpiprazole (REXULTI) 2 MG TABS, Take by mouth at bedtime., Disp: , Rfl:  .  Cholecalciferol (VITAMIN D-3) 5000 UNITS TABS, Take 1 tablet by mouth daily., Disp: , Rfl:  .  Coenzyme Q10 300 MG CAPS, Take 1 capsule by mouth daily., Disp: , Rfl:  .  desvenlafaxine (PRISTIQ) 100 MG 24 hr tablet, Take 100 mg by mouth daily., Disp: , Rfl:  .  fenofibrate 160 MG tablet, Take 160 mg by mouth daily., Disp: , Rfl:  .  ibuprofen (ADVIL,MOTRIN) 600 MG tablet, Take 1 tablet (600 mg total) by mouth  every 6 (six) hours as needed (mild pain)., Disp: 30 tablet, Rfl: 0 .  losartan (COZAAR) 50 MG tablet, Take 50 mg by mouth daily., Disp: , Rfl:  .  magnesium oxide (MAG-OX) 400 MG tablet, Take 400 mg by mouth daily., Disp: , Rfl:  .  metoprolol succinate (TOPROL-XL) 100 MG 24 hr tablet, Take 100 mg by mouth daily. Take with or immediately following a meal., Disp: , Rfl:  .  Omega-3 Fatty Acids (FISH OIL PO), Take 500 mg by mouth daily. , Disp: , Rfl:  .  omeprazole (PRILOSEC) 20 MG capsule, Take 20 mg by mouth daily., Disp: , Rfl:  .  tizanidine (ZANAFLEX) 2 MG capsule, Take 2 mg by mouth every 4 (four) hours as needed for muscle spasms., Disp: , Rfl:   Current Facility-Administered Medications:  .  methylPREDNISolone acetate (DEPO-MEDROL) injection 40 mg, 40 mg, Intra-articular, Once, Hilts, Michael, MD  PAST MEDICAL HISTORY: Past Medical History:  Diagnosis Date  . Cervical disc disease   . Complication of anesthesia    prolonged sedation  . Depression   . GERD (gastroesophageal reflux disease)   . HTN (hypertension)   . Hyperlipidemia   . MGUS (monoclonal gammopathy of unknown significance) 03/15/2015  . Migraine headache   . Mitral valve prolapse   . Monoclonal (M) protein disease, multiple 'M' protein   . Osteoarthritis    generalized  . PONV (postoperative nausea and vomiting)   . Sleep apnea   . Vision abnormalities     PAST SURGICAL HISTORY: Past Surgical History:  Procedure Laterality Date  . ANTERIOR AND POSTERIOR REPAIR N/A 10/16/2015   Procedure: ANTERIOR (CYSTOCELE) AND POSTERIOR REPAIR (RECTOCELE);  Surgeon: Nunzio Cobbs, MD;  Location: [facility];  Service: Gynecology;  Laterality: N/A;  . APPENDECTOMY  1974  . BLADDER SUSPENSION N/A 10/16/2015   Procedure: TRANSVAGINAL TAPE (TVT)  PROCEDURE exact midurethral sling;  Surgeon: Nunzio Cobbs, MD;  Location: [facility];  Service: Gynecology;  Laterality: N/A;  . BREAST BIOPSY     negative x 2   . BREAST  EXCISIONAL BIOPSY Left 1998  . BREAST EXCISIONAL BIOPSY Right 1986  . BUNIONECTOMY     w/hammer toe repair  . CARPAL TUNNEL RELEASE Right   . CERVICAL DISC SURGERY  6/06  . CYSTO N/A 10/16/2015   Procedure: Kathrene Alu;  Surgeon: Nunzio Cobbs, MD;  Location: [facility];  Service: Gynecology;  Laterality: N/A;  . CYSTOSCOPY N/A 02/26/2016   Procedure: CYSTOSCOPY;  Surgeon: Nunzio Cobbs, MD;  Location: [facility];  Service: Gynecology;  Laterality: N/A;  . KNEE ARTHROSCOPY     right knee x 2  . MENISCUS REPAIR     right knee x 1  . TENS     upper and lower back  . TONSILECTOMY, ADENOIDECTOMY, BILATERAL MYRINGOTOMY AND TUBES    . TOTAL VAGINAL HYSTERECTOMY  05/2004   adenomyosis, prolapse--ovaries remain  . TRANSVAGINAL TAPE (TVT) REMOVAL N/A 02/26/2016   Procedure: Lysis of mid urethral sling, Anterior Colporrhaphy, Cystoscopy ;  Surgeon: Nunzio Cobbs, MD;  Location: [facility];  Service: Gynecology;  Laterality: N/A;  first case/ move first case to follow this. Need 1 hour/   . TUBAL LIGATION      FAMILY HISTORY: Family History  Problem Relation Age of Onset  . Heart failure Mother        congestive  . Diabetes Mother   . Hypertension Mother   . Heart disease Father        MI  . Diabetes Father   . Heart attack Father   . Cancer Brother 72       lung cancer  . Cancer Brother        metastasized, unknown origin  . Colon cancer Paternal Grandmother   . Diabetes Brother   . Breast cancer Cousin   . Breast cancer Cousin     SOCIAL HISTORY:  Social History   Socioeconomic History  . Marital status: Single    Spouse name: Not on file  . Number of children: 2  . Years of education: Not on file  . Highest education level: Not on file  Occupational History    Comment: Intake coordinator  Tobacco Use  . Smoking status: Current Every Day Smoker    Packs/day: 0.50    Years: 48.00    Pack years: 24.00    Types: Cigarettes  . Smokeless tobacco: Never  Used  . Tobacco comment: feb 202 at 1/2 to 3/4 pack cigs per day  Substance and Sexual Activity  . Alcohol use: No    Alcohol/week: 0.0 standard drinks  . Drug use: No  . Sexual activity: Not Currently    Partners: Female    Birth control/protection: Surgical    Comment: Hyst  Other Topics Concern  . Not on file  Social History Narrative   Right handed    Caffeine use: 3 cups per day   Lives with partner: Gerald Stabs.   Social Determinants of Health   Financial Resource Strain:   . Difficulty of Paying Living Expenses: Not on file  Food Insecurity:   . Worried About Charity fundraiser in the Last Year: Not on file  . Ran Out of Food in the Last Year: Not on file  Transportation Needs:   . Lack of Transportation (  Medical): Not on file  . Lack of Transportation (Non-Medical): Not on file  Physical Activity:   . Days of Exercise per Week: Not on file  . Minutes of Exercise per Session: Not on file  Stress:   . Feeling of Stress : Not on file  Social Connections:   . Frequency of Communication with Friends and Family: Not on file  . Frequency of Social Gatherings with Friends and Family: Not on file  . Attends Religious Services: Not on file  . Active Member of Clubs or Organizations: Not on file  . Attends Archivist Meetings: Not on file  . Marital Status: Not on file  Intimate Partner Violence:   . Fear of Current or Ex-Partner: Not on file  . Emotionally Abused: Not on file  . Physically Abused: Not on file  . Sexually Abused: Not on file     PHYSICAL EXAM  Vitals:   07/21/19 1127  BP: 110/70  Pulse: (!) 58  Temp: 97.6 F (36.4 C)  Weight: 158 lb 8 oz (71.9 kg)  Height: 5' 2.5" (1.588 m)    Body mass index is 28.53 kg/m.   Gen. exam: The head is normocephalic and atraumatic. The neck is nontender. Neck has a good range of motion.  Mental status: She is alert and fully oriented with fluent speech. Memory and attention appear to be  appropriate.  Cranial nerves: Extraocular muscles are intact. Pupils react to light and accommodation. Facial strength and sensation is normal. Trapezius strength is normal.   Hearing symmetric  Motor: Muscle bulk is normal. Muscle tone is normal.  Strength is 5/5 in the arms and legs.  No myotonia. Sensory: She has intact sensation to touch and vibration in the arms and legs  Coordination: Finger-nose-finger and heel-to-shin is performed well.  Gait: The station is normal. The gait and tandem walk are normal for age.  DTRs: Normal and symmetric in arms and knees    DIAGNOSTIC DATA (LABS, IMAGING, TESTING) - I reviewed patient records, labs, notes, testing and imaging myself where available.  Lab Results  Component Value Date   WBC 8.1 03/06/2017   HGB 12.6 03/06/2017   HCT 39.9 03/06/2017   MCV 93.0 03/06/2017   PLT 404 (H) 03/06/2017      Component Value Date/Time   NA 140 03/06/2017 1027   K 4.5 03/06/2017 1027   CL 105 11/21/2016 0951   CL 103 06/11/2012 1059   CO2 25 03/06/2017 1027   GLUCOSE 111 03/06/2017 1027   GLUCOSE 123 (H) 06/11/2012 1059   BUN 17.2 03/06/2017 1027   CREATININE 1.0 03/06/2017 1027   CALCIUM 10.0 03/06/2017 1027   PROT 7.5 03/06/2017 1027   PROT 7.0 03/06/2017 1027   ALBUMIN 4.0 03/06/2017 1027   AST 14 03/06/2017 1027   ALT 12 03/06/2017 1027   ALKPHOS 60 03/06/2017 1027   BILITOT 0.30 03/06/2017 1027   GFRNONAA 63 11/21/2016 0951   GFRAA 72 11/21/2016 0951       ASSESSMENT AND PLAN  Periodic paralysis  Episodic weakness  MGUS (monoclonal gammopathy of unknown significance)  OSA on CPAP   1.   She continues to do well with no major episodes and just a couple minor episodes of weakness that only lasted a few minutes.  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    We will increase the  acetazolamide dose to 3  pills a day if more spells occur. 2.    Continue CPAP. 3.     Return in 12 months or sooner if there are new or worsening neurologic symptoms.     Can alternate between seeing me and a nurse practitioner   Aniza Shor A. Felecia Shelling, MD, PhD [id number], [id number] PM Certified in Neurology, Clinical Neurophysiology, Sleep Medicine, Pain Medicine and Neuroimaging  [facility] [address], [place], [place] [address] [phone]

## 2019-08-22 ENCOUNTER — Encounter (INDEPENDENT_AMBULATORY_CARE_PROVIDER_SITE_OTHER): Payer: Self-pay

## 2019-08-22 ENCOUNTER — Ambulatory Visit (INDEPENDENT_AMBULATORY_CARE_PROVIDER_SITE_OTHER)
Admission: RE | Admit: 2019-08-22 | Discharge: 2019-08-22 | Disposition: A | Discharge status: Home / Self Care | Payer: Medicare Other | Source: Ambulatory Visit | Attending: Acute Care | Admitting: Acute Care

## 2019-08-22 ENCOUNTER — Other Ambulatory Visit: Payer: Self-pay

## 2019-08-22 DIAGNOSIS — F17210 Nicotine dependence, cigarettes, uncomplicated: Secondary | ICD-10-CM

## 2019-08-22 DIAGNOSIS — Z87891 Personal history of nicotine dependence: Secondary | ICD-10-CM | POA: Diagnosis not present

## 2019-08-22 DIAGNOSIS — Z122 Encounter for screening for malignant neoplasm of respiratory organs: Secondary | ICD-10-CM

## 2019-08-24 NOTE — Progress Notes (Signed)
Please call patient and let them  know their  low dose Ct was read as a Lung RADS 2: nodules that are benign in appearance and behavior with a very low likelihood of becoming a clinically active cancer due to size or lack of growth. Recommendation per radiology is for a repeat LDCT in 12 months. .Please let them  know we will order and schedule their  annual screening scan for 08/2020. Please let them  know there was notation of CAD on their  scan.  Please remind the patient  that this is a non-gated exam therefore degree or severity of disease  cannot be determined. Please have them  follow up with their PCP regarding potential risk factor modification, dietary therapy or pharmacologic therapy if clinically indicated. Pt.  is not  currently on statin therapy. Please place order for annual  screening scan for  08/2020 and fax results to PCP. Thanks so much.

## 2019-08-29 ENCOUNTER — Other Ambulatory Visit: Payer: Self-pay | Admitting: *Deleted

## 2019-08-29 DIAGNOSIS — F17210 Nicotine dependence, cigarettes, uncomplicated: Secondary | ICD-10-CM

## 2019-09-14 ENCOUNTER — Other Ambulatory Visit: Payer: Self-pay

## 2019-09-14 NOTE — Progress Notes (Signed)
64 y.o. [id number] [name] here for annual exam.    Getting her second vaccine for Covid today.   Had a right breast abscess in January, 2021.  It went away on it's own and she has a little bit of a lump and redness. She did not see a provider.  Good control of her bladder.  No leakage with cough or sneeze.  Emptying well.   She has hot flashes during the day and night.  Does not want prescription medication.   PCP: Harlan Stains, MD     Patient's last menstrual period was 03/07/2004 (approximate).           Sexually active: No.  The current method of family planning is status post hysterectomy.    Exercising: Yes.    waks 1 mile 3-4x/week Smoker:  yes  Health Maintenance: Pap: 04-21-06 Neg History of abnormal Pap:  no MMG: 02-07-19 3D/Neg/density B/BiRads1 Colonoscopy: 2017; Minimal diverticuliti, repeat in 10 years BMD: 09-05-16  Result : Normal TDaP: 09-13-18  Gardasil:   no HIV:04-07-17 NR Hep C: 04-07-17 Neg Screening Labs:  PCP.   reports that she has been smoking cigarettes. She has a 24.00 pack-year smoking history. She has never used smokeless tobacco. She reports that she does not drink alcohol or use drugs.  Past Medical History:  Diagnosis Date  . Cervical disc disease   . Complication of anesthesia    prolonged sedation  . Depression   . GERD (gastroesophageal reflux disease)   . HTN (hypertension)   . Hyperlipidemia   . MGUS (monoclonal gammopathy of unknown significance) 03/15/2015  . Migraine headache   . Mitral valve prolapse   . Monoclonal (M) protein disease, multiple 'M' protein   . Osteoarthritis    generalized  . PONV (postoperative nausea and vomiting)   . Sleep apnea   . Vision abnormalities     Past Surgical History:  Procedure Laterality Date  . ANTERIOR AND POSTERIOR REPAIR N/A 10/16/2015   Procedure: ANTERIOR (CYSTOCELE) AND POSTERIOR REPAIR (RECTOCELE);  Surgeon: Nunzio Cobbs, MD;  Location: [facility];  Service:  Gynecology;  Laterality: N/A;  . APPENDECTOMY  1974  . BLADDER SUSPENSION N/A 10/16/2015   Procedure: TRANSVAGINAL TAPE (TVT) PROCEDURE exact midurethral sling;  Surgeon: Nunzio Cobbs, MD;  Location: [facility];  Service: Gynecology;  Laterality: N/A;  . BREAST BIOPSY     negative x 2   . BREAST EXCISIONAL BIOPSY Left 1998  . BREAST EXCISIONAL BIOPSY Right 1986  . BUNIONECTOMY     w/hammer toe repair  . CARPAL TUNNEL RELEASE Right   . CERVICAL DISC SURGERY  6/06  . CYSTO N/A 10/16/2015   Procedure: Kathrene Alu;  Surgeon: Nunzio Cobbs, MD;  Location: [facility];  Service: Gynecology;  Laterality: N/A;  . CYSTOSCOPY N/A 02/26/2016   Procedure: CYSTOSCOPY;  Surgeon: Nunzio Cobbs, MD;  Location: [facility];  Service: Gynecology;  Laterality: N/A;  . KNEE ARTHROSCOPY     right knee x 2  . MENISCUS REPAIR     right knee x 1  . TENS     upper and lower back  . TONSILECTOMY, ADENOIDECTOMY, BILATERAL MYRINGOTOMY AND TUBES    . TOTAL VAGINAL HYSTERECTOMY  05/2004   adenomyosis, prolapse--ovaries remain  . TRANSVAGINAL TAPE (TVT) REMOVAL N/A 02/26/2016   Procedure: Lysis of mid urethral sling, Anterior Colporrhaphy, Cystoscopy ;  Surgeon: Nunzio Cobbs, MD;  Location: [facility];  Service: Gynecology;  Laterality: N/A;  first case/ move first case to follow this. Need 1 hour/   . TUBAL LIGATION      Current Outpatient Medications  Medication Sig Dispense Refill  . acetaminophen (TYLENOL) 500 MG tablet Take 1,000 mg by mouth daily as needed for mild pain. Take 1000 mg daily    . acetaZOLAMIDE (DIAMOX) 250 MG tablet Take 1 tablet (250 mg total) by mouth 2 (two) times daily. 60 tablet 12  . ALPRAZolam (XANAX) 0.5 MG tablet Take 0.25-0.5 mg by mouth 2 (two) times daily as needed for anxiety.     Marland Kitchen aspirin EC 81 MG tablet Take 81 mg by mouth daily.    . Brexpiprazole (REXULTI) 2 MG TABS Take by mouth at bedtime.    . Cholecalciferol (VITAMIN D-3) 5000 UNITS TABS Take 1  tablet by mouth daily.    . Coenzyme Q10 300 MG CAPS Take 1 capsule by mouth daily.    Marland Kitchen desvenlafaxine (PRISTIQ) 100 MG 24 hr tablet Take 100 mg by mouth daily.    . fenofibrate 160 MG tablet Take 160 mg by mouth daily.    Marland Kitchen ibuprofen (ADVIL,MOTRIN) 600 MG tablet Take 1 tablet (600 mg total) by mouth every 6 (six) hours as needed (mild pain). 30 tablet 0  . losartan (COZAAR) 50 MG tablet Take 50 mg by mouth daily.    . magnesium oxide (MAG-OX) 400 MG tablet Take 400 mg by mouth daily.    . metoprolol succinate (TOPROL-XL) 100 MG 24 hr tablet Take 100 mg by mouth daily. Take with or immediately following a meal.    . Omega-3 Fatty Acids (FISH OIL PO) Take 500 mg by mouth daily.     Marland Kitchen omeprazole (PRILOSEC) 20 MG capsule Take 20 mg by mouth daily.    . tizanidine (ZANAFLEX) 2 MG capsule Take 2 mg by mouth every 4 (four) hours as needed for muscle spasms.     No current facility-administered medications for this visit.    Family History  Problem Relation Age of Onset  . Heart failure Mother        congestive  . Diabetes Mother   . Hypertension Mother   . Heart disease Father        MI  . Diabetes Father   . Heart attack Father   . Cancer Brother 48       lung cancer  . Cancer Brother        metastasized, unknown origin  . Colon cancer Paternal Grandmother   . Diabetes Brother   . Breast cancer Cousin   . Breast cancer Cousin     Review of Systems  All other systems reviewed and are negative.   Exam:   BP (!) 142/78   Pulse 60   Temp (!) 97.1 F (36.2 C) (Temporal)   Resp 20   Ht 5' 2.5" (1.588 m)   Wt 158 lb 6.4 oz (71.8 kg)   LMP 03/07/2004 (Approximate)   BMI 28.51 kg/m     General appearance: alert, cooperative and appears stated age Head: normocephalic, without obvious abnormality, atraumatic Neck: no adenopathy, supple, symmetrical, trachea midline and thyroid normal to inspection and palpation Lungs: clear to auscultation bilaterally Breasts: normal  appearance, no masses or tenderness, No nipple retraction or dimpling, No nipple discharge or bleeding, No axillary adenopathy.  3 mm sebaceous cyst of right medial breast edge.  Hair follicle opening present at this site. Heart: regular rate and rhythm Abdomen: soft, non-tender;  no masses, no organomegaly Extremities: extremities normal, atraumatic, no cyanosis or edema Skin: skin color, texture, turgor normal. No rashes or lesions Lymph nodes: cervical, supraclavicular, and axillary nodes normal. Neurologic: grossly normal  Pelvic: External genitalia:  no lesions              No abnormal inguinal nodes palpated.              Urethra:  normal appearing urethra with no masses, tenderness or lesions              Bartholins and Skenes: normal                 Vagina: normal appearing vagina with normal color and discharge, no lesions              Cervix: absent              Pap taken: No. Bimanual Exam:  Uterus:  absent              Adnexa: no mass, fullness, tenderness              Rectal exam: Yes.  .  Confirms.              Anus:  normal sphincter tone, no lesions  Chaperone was present for exam.  Assessment:   Well woman visit with normal exam. Status post TVH - fibroids, adenomyosis. Ovaries remain.  Status post A and P repair with TVT and cysto.  Status post lysis of midurethral sling. Mild GSI.  MGUS.  FH multiple cancers.  Genetic testing negative.  Hx chronic medical issues.  Periodic paralysis.   Hx decreased libido.  Smoker.  Declines cessation.  Sebaceous cyst of the skin of right medial breast.   Plan: Mammogram screening discussed.  Routine screening mammogram ok. Self breast awareness reviewed. Pap and HR HPV as above. Guidelines for Calcium, Vitamin D, regular exercise program including cardiovascular and weight bearing exercise. I reviewed options of Gabapentin versus herbal tx of menopausal symptoms.  Follow up annually and prn.   After visit summary  provided.

## 2019-09-15 ENCOUNTER — Encounter: Payer: Self-pay | Admitting: Obstetrics and Gynecology

## 2019-09-15 ENCOUNTER — Ambulatory Visit (INDEPENDENT_AMBULATORY_CARE_PROVIDER_SITE_OTHER): Payer: Medicare Other | Admitting: Obstetrics and Gynecology

## 2019-09-15 VITALS — BP 142/78 | HR 60 | Temp 97.1°F | Resp 20 | Ht 62.5 in | Wt 158.4 lb

## 2019-09-15 DIAGNOSIS — Z01419 Encounter for gynecological examination (general) (routine) without abnormal findings: Secondary | ICD-10-CM

## 2019-09-15 NOTE — Patient Instructions (Signed)

## 2019-11-21 ENCOUNTER — Telehealth: Payer: Self-pay | Admitting: Obstetrics and Gynecology

## 2019-11-21 NOTE — Telephone Encounter (Signed)
AEX 09/15/19 MMG 02/2019 Birads 1, neg at [facility] No H/o breast Cancer  Spoke with pt. Pt states noticed breast spot/lump in the same area as sebaceous cyst on the right breast from AEX. Pt noticed yesterday. Pt states is slightly red around it like last time, but denies warm to touch. Denies fever, chills, nipple discharge or breast skin changes or size. Pt request OV for evaluation.  Pt scheduled with Dr [name] as OV on 11/23/19 at 8 am. Pt agreeable and verbalized understanding.   Routing to Dr [name] for review  Encounter closed.

## 2019-11-21 NOTE — Telephone Encounter (Signed)
Patient found a breast lump and would like to see Dr.Silva.

## 2019-11-22 NOTE — Progress Notes (Signed)
GYNECOLOGY  VISIT   HPI: 64 y.o.   Single  Caucasian  female   [id number] with Patient's last menstrual period was 03/07/2004 (approximate).   here for right breast mass.   States her right breast lump returned a couple of nights ago.  No pain this time.  Some redness.  No drainage.  No trauma.   Patient reported at her visit on 09/15/19 that she had a breast abscess in January that resolved in the area.  I detected a 3 mm sebaceous cyst at the right medial breast edge.    No other skin lesions.   Her second Covid vaccine was in her right arm on 09/15/19.   GYNECOLOGIC HISTORY: Patient's last menstrual period was 03/07/2004 (approximate). Contraception: Hyst Menopausal hormone therapy: none Last mammogram:  02-07-19 3D/Neg/density B/BiRads1 Last pap smear:  04-21-06 Neg        OB History    Gravida  3   Para  2   Term  2   Preterm  0   AB  1   Living  2     SAB  1   TAB  0   Ectopic  0   Multiple  0   Live Births  2              Patient Active Problem List   Diagnosis Date Noted  . 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 Pain in right hip 03/04/2018  . Episodic weakness 10/08/2016  . Depression 10/08/2016  . OSA on CPAP 10/08/2016  . Status post surgery 10/16/2015  . MGUS (monoclonal gammopathy of unknown significance) 03/15/2015  . Tobacco abuse 03/14/2014  . Monoclonal (M) protein disease, multiple 'M' protein   . Osteoarthritis   . Mitral valve prolapse   . HTN (hypertension)   . Hyperlipidemia     Past Medical History:  Diagnosis Date  . Cervical disc disease   . Complication of anesthesia    prolonged sedation  . Depression   . GERD (gastroesophageal reflux disease)   . HTN (hypertension)   . Hyperlipidemia   . MGUS (monoclonal gammopathy of unknown significance) 03/15/2015  . Migraine headache   . Mitral valve prolapse   . Monoclonal (M) protein disease, multiple 'M' protein   . Osteoarthritis    generalized  . PONV (postoperative nausea  and vomiting)   . Sleep apnea   . Vision abnormalities     Past Surgical History:  Procedure Laterality Date  . ANTERIOR AND POSTERIOR REPAIR N/A 10/16/2015   Procedure: ANTERIOR (CYSTOCELE) AND POSTERIOR REPAIR (RECTOCELE);  Surgeon: Nunzio Cobbs, MD;  Location: [facility];  Service: Gynecology;  Laterality: N/A;  . APPENDECTOMY  1974  . BLADDER SUSPENSION N/A 10/16/2015   Procedure: TRANSVAGINAL TAPE (TVT) PROCEDURE exact midurethral sling;  Surgeon: Nunzio Cobbs, MD;  Location: [facility];  Service: Gynecology;  Laterality: N/A;  . BREAST BIOPSY     negative x 2   . BREAST EXCISIONAL BIOPSY Left 1998  . BREAST EXCISIONAL BIOPSY Right 1986  . BUNIONECTOMY     w/hammer toe repair  . CARPAL TUNNEL RELEASE Right   . CERVICAL DISC SURGERY  6/06  . CYSTO N/A 10/16/2015   Procedure: Kathrene Alu;  Surgeon: Nunzio Cobbs, MD;  Location: [facility];  Service: Gynecology;  Laterality: N/A;  . CYSTOSCOPY N/A 02/26/2016   Procedure: CYSTOSCOPY;  Surgeon: Nunzio Cobbs, MD;  Location: [facility];  Service: Gynecology;  Laterality: N/A;  .  KNEE ARTHROSCOPY     right knee x 2  . MENISCUS REPAIR     right knee x 1  . TENS     upper and lower back  . TONSILECTOMY, ADENOIDECTOMY, BILATERAL MYRINGOTOMY AND TUBES    . TOTAL VAGINAL HYSTERECTOMY  05/2004   adenomyosis, prolapse--ovaries remain  . TRANSVAGINAL TAPE (TVT) REMOVAL N/A 02/26/2016   Procedure: Lysis of mid urethral sling, Anterior Colporrhaphy, Cystoscopy ;  Surgeon: Nunzio Cobbs, MD;  Location: [facility];  Service: Gynecology;  Laterality: N/A;  first case/ move first case to follow this. Need 1 hour/   . TUBAL LIGATION      Current Outpatient Medications  Medication Sig Dispense Refill  . acetaminophen (TYLENOL) 500 MG tablet Take 1,000 mg by mouth daily as needed for mild pain. Take 1000 mg daily    . acetaZOLAMIDE (DIAMOX) 250 MG tablet Take 1 tablet (250 mg total) by mouth 2 (two) times daily. 60  tablet 12  . ALPRAZolam (XANAX) 0.5 MG tablet Take 0.25-0.5 mg by mouth 2 (two) times daily as needed for anxiety.     Marland Kitchen aspirin EC 81 MG tablet Take 81 mg by mouth daily.    . Brexpiprazole (REXULTI) 2 MG TABS Take by mouth at bedtime.    . Cholecalciferol (VITAMIN D-3) 5000 UNITS TABS Take 1 tablet by mouth daily.    . Coenzyme Q10 300 MG CAPS Take 1 capsule by mouth daily.    Marland Kitchen desvenlafaxine (PRISTIQ) 100 MG 24 hr tablet Take 100 mg by mouth daily.    . fenofibrate 160 MG tablet Take 160 mg by mouth daily.    Marland Kitchen ibuprofen (ADVIL,MOTRIN) 600 MG tablet Take 1 tablet (600 mg total) by mouth every 6 (six) hours as needed (mild pain). 30 tablet 0  . losartan (COZAAR) 50 MG tablet Take 50 mg by mouth daily.    . magnesium oxide (MAG-OX) 400 MG tablet Take 400 mg by mouth daily.    . metoprolol succinate (TOPROL-XL) 100 MG 24 hr tablet Take 100 mg by mouth daily. Take with or immediately following a meal.    . Omega-3 Fatty Acids (FISH OIL PO) Take 500 mg by mouth daily.     Marland Kitchen omeprazole (PRILOSEC) 20 MG capsule Take 20 mg by mouth daily.    Marland Kitchen tiZANidine (ZANAFLEX) 2 MG tablet Take 2 mg by mouth 3 (three) times daily.     No current facility-administered medications for this visit.     ALLERGIES: Hydrocodone and Percocet [oxycodone-acetaminophen]  Family History  Problem Relation Age of Onset  . Heart failure Mother        congestive  . Diabetes Mother   . Hypertension Mother   . Heart disease Father        MI  . Diabetes Father   . Heart attack Father   . Cancer Brother 49       lung cancer  . Cancer Brother        metastasized, unknown origin  . Colon cancer Paternal Grandmother   . Diabetes Brother   . Breast cancer Cousin   . Breast cancer Cousin     Social History   Socioeconomic History  . Marital status: Single    Spouse name: Not on file  . Number of children: 2  . Years of education: Not on file  . Highest education level: Not on file  Occupational History     Comment: Intake coordinator  Tobacco Use  .  Smoking status: Current Every Day Smoker    Packs/day: 0.50    Years: 48.00    Pack years: 24.00    Types: Cigarettes  . Smokeless tobacco: Never Used  . Tobacco comment: feb 202 at 1/2 to 3/4 pack cigs per day  Substance and Sexual Activity  . Alcohol use: No    Alcohol/week: 0.0 standard drinks  . Drug use: No  . Sexual activity: Not Currently    Partners: Female    Birth control/protection: Surgical    Comment: Hyst  Other Topics Concern  . Not on file  Social History Narrative   Right handed    Caffeine use: 3 cups per day   Lives with partner: Gerald Stabs.   Social Determinants of Health   Financial Resource Strain:   . Difficulty of Paying Living Expenses:   Food Insecurity:   . Worried About Charity fundraiser in the Last Year:   . Arboriculturist in the Last Year:   Transportation Needs:   . Film/video editor (Medical):   Marland Kitchen Lack of Transportation (Non-Medical):   Physical Activity:   . Days of Exercise per Week:   . Minutes of Exercise per Session:   Stress:   . Feeling of Stress :   Social Connections:   . Frequency of Communication with Friends and Family:   . Frequency of Social Gatherings with Friends and Family:   . Attends Religious Services:   . Active Member of Clubs or Organizations:   . Attends Archivist Meetings:   Marland Kitchen Marital Status:   Intimate Partner Violence:   . Fear of Current or Ex-Partner:   . Emotionally Abused:   Marland Kitchen Physically Abused:   . Sexually Abused:     Review of Systems  All other systems reviewed and are negative.   PHYSICAL EXAMINATION:    BP (!) 142/70   Pulse 60   Temp (!) 97.3 F (36.3 C) (Temporal)   Ht 5\' 2"  (1.575 m)   Wt 153 lb 3.2 oz (69.5 kg)   LMP 03/07/2004 (Approximate)   BMI 28.02 kg/m     General appearance: alert, cooperative and appears stated age   Breasts: left - normal appearance, no masses or tenderness, No nipple retraction or dimpling,  No nipple discharge or bleeding, No axillary or supraclavicular adenopathy Right - small area of erythema and mass 3 - 4 mm diameter of left medial inferior breast at margin.   No nipple retraction or dimpling, No nipple discharge or bleeding, No axillary or supraclavicular adenopathy   Chaperone was present for exam.  ASSESSMENT  Right breast mass, right medial breast, measuring 3 - 4 mm in size.  Possible sebaceous cyst.     PLAN  We discussed sebaceous cysts.  Will proceed with dx mammogram and right breast US at [facility].  No abx at this time.  Can use heat on it.  FU prn.    An After Visit Summary was printed and given to the patient.  _15_____ minutes face to face time of which over 50% was spent in counseling.

## 2019-11-23 ENCOUNTER — Other Ambulatory Visit: Payer: Self-pay

## 2019-11-23 ENCOUNTER — Encounter: Payer: Self-pay | Admitting: Obstetrics and Gynecology

## 2019-11-23 ENCOUNTER — Ambulatory Visit (INDEPENDENT_AMBULATORY_CARE_PROVIDER_SITE_OTHER): Payer: Medicare Other | Admitting: Obstetrics and Gynecology

## 2019-11-23 VITALS — BP 142/70 | HR 60 | Temp 97.3°F | Ht 62.0 in | Wt 153.2 lb

## 2019-11-23 DIAGNOSIS — N6310 Unspecified lump in the right breast, unspecified quadrant: Secondary | ICD-10-CM

## 2019-11-23 NOTE — Progress Notes (Signed)
Patient scheduled while in office for right breast Dx MMG and right breast US, if needed, on 12/01/19 at 9:50am, arrive at 9:30am. Patient is agreeable to date and time.   Placed in [place] hold.

## 2019-12-01 ENCOUNTER — Other Ambulatory Visit: Payer: Medicare Other

## 2019-12-16 ENCOUNTER — Telehealth: Payer: Self-pay | Admitting: Obstetrics and Gynecology

## 2019-12-16 NOTE — Telephone Encounter (Signed)
Patient cancelled and did not reschedule breast ultrasound and mammogram with [facility]. Patient was initially scheduled for 12/01/2019. Please advise on how to proceed.

## 2019-12-19 NOTE — Telephone Encounter (Signed)
Left message to call Milderd Manocchio, RN at [facility] [phone].   

## 2019-12-19 NOTE — Telephone Encounter (Signed)
Triage, please contact patient to reschedule patient's breast imaging.  She does have a mass, and needs evaluation.  We do not know if the mass is in the skin or in the breast.

## 2019-12-19 NOTE — Telephone Encounter (Signed)
Spoke with patient. Patient reports that the lump in the right breast resolved prior to the scheduled imaging, so she cancelled. Patient reports she no longer feels any lumps in the right breast, no current concerns.   Advised patient I will provide update to Dr. [name] and f/u with any additional recommendations. Patient agreeable.   Routing to Dr. [name].

## 2019-12-19 NOTE — Telephone Encounter (Signed)
Patient returned call

## 2019-12-20 NOTE — Telephone Encounter (Signed)
Return call from patient. Patient scheduled for 12/21/2019 at 1000AM with Dr. [name] for a breast recheck. Encounter closed.  Routing to Dr. [name] and Glorianne Manchester, RN for [facility]

## 2019-12-20 NOTE — Telephone Encounter (Signed)
Left message to call Elainna Eshleman, RN at [facility] [phone].   

## 2019-12-20 NOTE — Telephone Encounter (Signed)
Please have patient schedule a brief visit with me.  Thank you!

## 2019-12-21 ENCOUNTER — Other Ambulatory Visit: Payer: Self-pay

## 2019-12-21 ENCOUNTER — Ambulatory Visit (INDEPENDENT_AMBULATORY_CARE_PROVIDER_SITE_OTHER): Payer: Medicare Other | Admitting: Obstetrics and Gynecology

## 2019-12-21 ENCOUNTER — Encounter: Payer: Self-pay | Admitting: Obstetrics and Gynecology

## 2019-12-21 VITALS — BP 132/70 | HR 60 | Temp 97.2°F | Ht 62.0 in | Wt 151.0 lb

## 2019-12-21 DIAGNOSIS — Z87898 Personal history of other specified conditions: Secondary | ICD-10-CM | POA: Diagnosis not present

## 2019-12-21 NOTE — Progress Notes (Signed)
GYNECOLOGY  VISIT   HPI: 64 y.o.   Single  Caucasian  female   [id number] with Patient's last menstrual period was 03/07/2004 (approximate).   here for breast recheck.  She had a right breast mass.  No drainage.  Still a little red.  No soreness.   She cancelled her dx mammogram.  She did have this occur in January, 2021.   GYNECOLOGIC HISTORY: Patient's last menstrual period was 03/07/2004 (approximate). Contraception: Hyst Menopausal hormone therapy:  none Last mammogram:  02-07-19 3D/Neg/density B/BiRads1 Last pap smear: 04-21-06 Neg        OB History    Gravida  3   Para  2   Term  2   Preterm  0   AB  1   Living  2     SAB  1   TAB  0   Ectopic  0   Multiple  0   Live Births  2              Patient Active Problem List   Diagnosis Date Noted  . 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 Pain in right hip 03/04/2018  . Episodic weakness 10/08/2016  . Depression 10/08/2016  . OSA on CPAP 10/08/2016  . Status post surgery 10/16/2015  . MGUS (monoclonal gammopathy of unknown significance) 03/15/2015  . Tobacco abuse 03/14/2014  . Monoclonal (M) protein disease, multiple 'M' protein   . Osteoarthritis   . Mitral valve prolapse   . HTN (hypertension)   . Hyperlipidemia     Past Medical History:  Diagnosis Date  . Cervical disc disease   . Complication of anesthesia    prolonged sedation  . Depression   . GERD (gastroesophageal reflux disease)   . HTN (hypertension)   . Hyperlipidemia   . MGUS (monoclonal gammopathy of unknown significance) 03/15/2015  . Migraine headache   . Mitral valve prolapse   . Monoclonal (M) protein disease, multiple 'M' protein   . Osteoarthritis    generalized  . PONV (postoperative nausea and vomiting)   . Sleep apnea   . Vision abnormalities     Past Surgical History:  Procedure Laterality Date  . ANTERIOR AND POSTERIOR REPAIR N/A 10/16/2015   Procedure: ANTERIOR (CYSTOCELE) AND POSTERIOR REPAIR (RECTOCELE);  Surgeon:  Nunzio Cobbs, MD;  Location: [facility];  Service: Gynecology;  Laterality: N/A;  . APPENDECTOMY  1974  . BLADDER SUSPENSION N/A 10/16/2015   Procedure: TRANSVAGINAL TAPE (TVT) PROCEDURE exact midurethral sling;  Surgeon: Nunzio Cobbs, MD;  Location: [facility];  Service: Gynecology;  Laterality: N/A;  . BREAST BIOPSY     negative x 2   . BREAST EXCISIONAL BIOPSY Left 1998  . BREAST EXCISIONAL BIOPSY Right 1986  . BUNIONECTOMY     w/hammer toe repair  . CARPAL TUNNEL RELEASE Right   . CERVICAL DISC SURGERY  6/06  . CYSTO N/A 10/16/2015   Procedure: Kathrene Alu;  Surgeon: Nunzio Cobbs, MD;  Location: [facility];  Service: Gynecology;  Laterality: N/A;  . CYSTOSCOPY N/A 02/26/2016   Procedure: CYSTOSCOPY;  Surgeon: Nunzio Cobbs, MD;  Location: [facility];  Service: Gynecology;  Laterality: N/A;  . KNEE ARTHROSCOPY     right knee x 2  . MENISCUS REPAIR     right knee x 1  . TENS     upper and lower back  . TONSILECTOMY, ADENOIDECTOMY, BILATERAL MYRINGOTOMY AND TUBES    . TOTAL VAGINAL HYSTERECTOMY  05/2004   adenomyosis, prolapse--ovaries remain  . TRANSVAGINAL TAPE (TVT) REMOVAL N/A 02/26/2016   Procedure: Lysis of mid urethral sling, Anterior Colporrhaphy, Cystoscopy ;  Surgeon: Nunzio Cobbs, MD;  Location: [facility];  Service: Gynecology;  Laterality: N/A;  first case/ move first case to follow this. Need 1 hour/   . TUBAL LIGATION      Current Outpatient Medications  Medication Sig Dispense Refill  . acetaminophen (TYLENOL) 500 MG tablet Take 1,000 mg by mouth daily as needed for mild pain. Take 1000 mg daily    . acetaZOLAMIDE (DIAMOX) 250 MG tablet Take 1 tablet (250 mg total) by mouth 2 (two) times daily. 60 tablet 12  . ALPRAZolam (XANAX) 0.5 MG tablet Take 0.25-0.5 mg by mouth 2 (two) times daily as needed for anxiety.     Marland Kitchen aspirin EC 81 MG tablet Take 81 mg by mouth daily.    . Brexpiprazole (REXULTI) 2 MG TABS Take by mouth at bedtime.    .  Cholecalciferol (VITAMIN D-3) 5000 UNITS TABS Take 1 tablet by mouth daily.    . Coenzyme Q10 300 MG CAPS Take 1 capsule by mouth daily.    Marland Kitchen desvenlafaxine (PRISTIQ) 100 MG 24 hr tablet Take 100 mg by mouth daily.    . fenofibrate 160 MG tablet Take 160 mg by mouth daily.    Marland Kitchen ibuprofen (ADVIL,MOTRIN) 600 MG tablet Take 1 tablet (600 mg total) by mouth every 6 (six) hours as needed (mild pain). 30 tablet 0  . losartan (COZAAR) 50 MG tablet Take 50 mg by mouth daily.    . magnesium oxide (MAG-OX) 400 MG tablet Take 400 mg by mouth daily.    . metoprolol succinate (TOPROL-XL) 100 MG 24 hr tablet Take 100 mg by mouth daily. Take with or immediately following a meal.    . Omega-3 Fatty Acids (FISH OIL PO) Take 500 mg by mouth daily.     Marland Kitchen omeprazole (PRILOSEC) 20 MG capsule Take 20 mg by mouth daily.    Marland Kitchen tiZANidine (ZANAFLEX) 2 MG tablet Take 2 mg by mouth 3 (three) times daily.     No current facility-administered medications for this visit.     ALLERGIES: Hydrocodone and Percocet [oxycodone-acetaminophen]  Family History  Problem Relation Age of Onset  . Heart failure Mother        congestive  . Diabetes Mother   . Hypertension Mother   . Heart disease Father        MI  . Diabetes Father   . Heart attack Father   . Cancer Brother 6       lung cancer  . Cancer Brother        metastasized, unknown origin  . Colon cancer Paternal Grandmother   . Diabetes Brother   . Breast cancer Cousin   . Breast cancer Cousin     Social History   Socioeconomic History  . Marital status: Single    Spouse name: Not on file  . Number of children: 2  . Years of education: Not on file  . Highest education level: Not on file  Occupational History    Comment: Intake coordinator  Tobacco Use  . Smoking status: Current Every Day Smoker    Packs/day: 0.50    Years: 48.00    Pack years: 24.00    Types: Cigarettes  . Smokeless tobacco: Never Used  . Tobacco comment: feb 202 at 1/2 to 3/4  pack cigs per day  Vaping  Use  . Vaping Use: Never used  Substance and Sexual Activity  . Alcohol use: No    Alcohol/week: 0.0 standard drinks  . Drug use: No  . Sexual activity: Not Currently    Partners: Female    Birth control/protection: Surgical    Comment: Hyst  Other Topics Concern  . Not on file  Social History Narrative   Right handed    Caffeine use: 3 cups per day   Lives with partner: Gerald Stabs.   Social Determinants of Health   Financial Resource Strain:   . Difficulty of Paying Living Expenses:   Food Insecurity:   . Worried About Charity fundraiser in the Last Year:   . Arboriculturist in the Last Year:   Transportation Needs:   . Film/video editor (Medical):   Marland Kitchen Lack of Transportation (Non-Medical):   Physical Activity:   . Days of Exercise per Week:   . Minutes of Exercise per Session:   Stress:   . Feeling of Stress :   Social Connections:   . Frequency of Communication with Friends and Family:   . Frequency of Social Gatherings with Friends and Family:   . Attends Religious Services:   . Active Member of Clubs or Organizations:   . Attends Archivist Meetings:   Marland Kitchen Marital Status:   Intimate Partner Violence:   . Fear of Current or Ex-Partner:   . Emotionally Abused:   Marland Kitchen Physically Abused:   . Sexually Abused:     Review of Systems  All other systems reviewed and are negative.   PHYSICAL EXAMINATION:    BP 132/70   Pulse 60   Temp (!) 97.2 F (36.2 C) (Temporal)   Ht 5\' 2"  (1.575 m)   Wt 151 lb (68.5 kg)   LMP 03/07/2004 (Approximate)   BMI 27.62 kg/m     General appearance: alert, cooperative and appears stated age   Breasts: normal appearance, no masses or tenderness, No nipple retraction or dimpling, No nipple discharge or bleeding, No axillary or supraclavicular adenopathy   ASSESSMENT  History of right breast mass, resolved.  Recurrent skin lesions of breast.  ?MRSA  PLAN  Ok to proceed with routine screening  mammogram in August, 2021.  We discussed skin/wound cx if she has recurrent sores anywhere on the body.  I did educate her about mupirocin ointment if she tests positive for MRSA someday.  Fu prn.    An After Visit Summary was printed and given to the patient.  10____ minutes face to face time of which over 50% was spent in counseling.

## 2020-03-19 ENCOUNTER — Other Ambulatory Visit: Payer: Self-pay | Admitting: Obstetrics and Gynecology

## 2020-03-19 DIAGNOSIS — Z1231 Encounter for screening mammogram for malignant neoplasm of breast: Secondary | ICD-10-CM

## 2020-03-20 ENCOUNTER — Ambulatory Visit
Admission: RE | Admit: 2020-03-20 | Discharge: 2020-03-20 | Disposition: A | Discharge status: Home / Self Care | Payer: Medicare Other | Source: Ambulatory Visit | Attending: Obstetrics and Gynecology | Admitting: Obstetrics and Gynecology

## 2020-03-20 ENCOUNTER — Other Ambulatory Visit: Payer: Self-pay

## 2020-03-20 DIAGNOSIS — Z1231 Encounter for screening mammogram for malignant neoplasm of breast: Secondary | ICD-10-CM

## 2020-07-16 DIAGNOSIS — F17200 Nicotine dependence, unspecified, uncomplicated: Secondary | ICD-10-CM | POA: Diagnosis not present

## 2020-07-16 DIAGNOSIS — E782 Mixed hyperlipidemia: Secondary | ICD-10-CM | POA: Diagnosis not present

## 2020-07-16 DIAGNOSIS — D472 Monoclonal gammopathy: Secondary | ICD-10-CM | POA: Diagnosis not present

## 2020-07-16 DIAGNOSIS — I10 Essential (primary) hypertension: Secondary | ICD-10-CM | POA: Diagnosis not present

## 2020-07-16 DIAGNOSIS — R7303 Prediabetes: Secondary | ICD-10-CM | POA: Diagnosis not present

## 2020-07-17 ENCOUNTER — Other Ambulatory Visit: Payer: Self-pay | Admitting: Hematology and Oncology

## 2020-07-17 DIAGNOSIS — D472 Monoclonal gammopathy: Secondary | ICD-10-CM

## 2020-07-18 ENCOUNTER — Telehealth: Payer: Self-pay | Admitting: Hematology and Oncology

## 2020-07-18 NOTE — Telephone Encounter (Signed)
Scheduled appt per 1/11 sch msg - pt is aware of apt date and time  

## 2020-07-23 ENCOUNTER — Ambulatory Visit: Payer: Medicare Other | Admitting: Neurology

## 2020-07-25 ENCOUNTER — Encounter: Payer: Self-pay | Admitting: Hematology and Oncology

## 2020-07-25 ENCOUNTER — Inpatient Hospital Stay: Payer: Medicare Other | Attending: Hematology and Oncology | Admitting: Hematology and Oncology

## 2020-07-25 ENCOUNTER — Inpatient Hospital Stay: Payer: Medicare Other

## 2020-07-25 ENCOUNTER — Other Ambulatory Visit: Payer: Self-pay

## 2020-07-25 DIAGNOSIS — D472 Monoclonal gammopathy: Secondary | ICD-10-CM

## 2020-07-25 DIAGNOSIS — Z720 Tobacco use: Secondary | ICD-10-CM | POA: Diagnosis not present

## 2020-07-25 LAB — COMPREHENSIVE METABOLIC PANEL
ALT: 12 U/L (ref 0–44)
AST: 15 U/L (ref 15–41)
Albumin: 4.4 g/dL (ref 3.5–5.0)
Alkaline Phosphatase: 61 U/L (ref 38–126)
Anion gap: 10 (ref 5–15)
BUN: 17 mg/dL (ref 8–23)
CO2: 23 mmol/L (ref 22–32)
Calcium: 9.4 mg/dL (ref 8.9–10.3)
Chloride: 107 mmol/L (ref 98–111)
Creatinine, Ser: 1.12 mg/dL — ABNORMAL HIGH (ref 0.44–1.00)
GFR, Estimated: 55 mL/min — ABNORMAL LOW (ref >60)
Glucose, Bld: 116 mg/dL — ABNORMAL HIGH (ref 70–99)
Potassium: 4 mmol/L (ref 3.5–5.1)
Sodium: 140 mmol/L (ref 135–145)
Total Bilirubin: 0.3 mg/dL (ref 0.3–1.2)
Total Protein: 7.9 g/dL (ref 6.5–8.1)

## 2020-07-25 LAB — CBC WITH DIFFERENTIAL/PLATELET
Abs Immature Granulocytes: 0.02 10*3/uL (ref 0.00–0.07)
Basophils Absolute: 0.1 10*3/uL (ref 0.0–0.1)
Basophils Relative: 1 %
Eosinophils Absolute: 0.2 10*3/uL (ref 0.0–0.5)
Eosinophils Relative: 2 %
HCT: 42 % (ref 36.0–46.0)
Hemoglobin: 13.6 g/dL (ref 12.0–15.0)
Immature Granulocytes: 0 %
Lymphocytes Relative: 34 %
Lymphs Abs: 3 10*3/uL (ref 0.7–4.0)
MCH: 29.8 pg (ref 26.0–34.0)
MCHC: 32.4 g/dL (ref 30.0–36.0)
MCV: 91.9 fL (ref 80.0–100.0)
Monocytes Absolute: 0.5 10*3/uL (ref 0.1–1.0)
Monocytes Relative: 5 %
Neutro Abs: 5 10*3/uL (ref 1.7–7.7)
Neutrophils Relative %: 58 %
Platelets: 430 10*3/uL — ABNORMAL HIGH (ref 150–400)
RBC: 4.57 MIL/uL (ref 3.87–5.11)
RDW: 12.7 % (ref 11.5–15.5)
WBC: 8.7 10*3/uL (ref 4.0–10.5)
nRBC: 0 % (ref 0.0–0.2)

## 2020-07-25 NOTE — Assessment & Plan Note (Signed)
The patient is billed as a new patient because it has been more than 3 years since her last visit Myeloma panel is pending I will call her with results next week If her myeloma panel is stable, we will continue to see her once a year She is in agreement

## 2020-07-25 NOTE — Progress Notes (Signed)
[facility] FOLLOW-UP progress notes  Patient Care Team: Harlan Stains, MD as PCP - General (Family Medicine) Bo Merino, MD as Consulting Physician (Rheumatology) Harlan Stains, MD as Attending Physician (Family Medicine)  CHIEF COMPLAINTS/PURPOSE OF VISIT:  MGUS  HISTORY OF PRESENTING ILLNESS:  [name] 65 y.o. female was seen again after loss of follow-up formal over 3 years Since last time I saw her, she denies worsening back pain No peripheral neuropathy No recent infection She is up-to-date with influenza vaccination and COVID vaccination She continues to smoke She is up-to-date with her screening program  I reviewed the patient's records extensive and collaborated the history with the patient. Summary of her history is as follows: She reports she still has persistent diffuse bone pain in her shoulders, low back, bilateral hips, bilateral knees. She recently has TENS unit implantation however has not had much improvement of her diffuse bone pain.  She has mild fatigue due to chronic bone pain. She had abnormal M spike detected with no evidence of lytic lesion. She is being observed.  MEDICAL HISTORY:  Past Medical History:  Diagnosis Date  . Cervical disc disease   . Complication of anesthesia    prolonged sedation  . Depression   . GERD (gastroesophageal reflux disease)   . HTN (hypertension)   . Hyperlipidemia   . MGUS (monoclonal gammopathy of unknown significance) 03/15/2015  . Migraine headache   . Mitral valve prolapse   . Monoclonal (M) protein disease, multiple 'M' protein   . Osteoarthritis    generalized  . PONV (postoperative nausea and vomiting)   . Sleep apnea   . Vision abnormalities     SURGICAL HISTORY: Past Surgical History:  Procedure Laterality Date  . ANTERIOR AND POSTERIOR REPAIR N/A 10/16/2015   Procedure: ANTERIOR (CYSTOCELE) AND POSTERIOR REPAIR (RECTOCELE);  Surgeon: Nunzio Cobbs, MD;  Location:  [facility];  Service: Gynecology;  Laterality: N/A;  . APPENDECTOMY  1974  . BLADDER SUSPENSION N/A 10/16/2015   Procedure: TRANSVAGINAL TAPE (TVT) PROCEDURE exact midurethral sling;  Surgeon: Nunzio Cobbs, MD;  Location: [facility];  Service: Gynecology;  Laterality: N/A;  . BREAST BIOPSY     negative x 2   . BREAST EXCISIONAL BIOPSY Left 1998  . BREAST EXCISIONAL BIOPSY Right 1986  . BUNIONECTOMY     w/hammer toe repair  . CARPAL TUNNEL RELEASE Right   . CERVICAL DISC SURGERY  6/06  . CYSTO N/A 10/16/2015   Procedure: Kathrene Alu;  Surgeon: Nunzio Cobbs, MD;  Location: [facility];  Service: Gynecology;  Laterality: N/A;  . CYSTOSCOPY N/A 02/26/2016   Procedure: CYSTOSCOPY;  Surgeon: Nunzio Cobbs, MD;  Location: [facility];  Service: Gynecology;  Laterality: N/A;  . KNEE ARTHROSCOPY     right knee x 2  . MENISCUS REPAIR     right knee x 1  . TENS     upper and lower back  . TONSILECTOMY, ADENOIDECTOMY, BILATERAL MYRINGOTOMY AND TUBES    . TOTAL VAGINAL HYSTERECTOMY  05/2004   adenomyosis, prolapse--ovaries remain  . TRANSVAGINAL TAPE (TVT) REMOVAL N/A 02/26/2016   Procedure: Lysis of mid urethral sling, Anterior Colporrhaphy, Cystoscopy ;  Surgeon: Nunzio Cobbs, MD;  Location: [facility];  Service: Gynecology;  Laterality: N/A;  first case/ move first case to follow this. Need 1 hour/   . TUBAL LIGATION      SOCIAL HISTORY: Social History   Socioeconomic  History  . Marital status: Single    Spouse name: Not on file  . Number of children: 2  . Years of education: Not on file  . Highest education level: Not on file  Occupational History    Comment: Intake coordinator  Tobacco Use  . Smoking status: Current Every Day Smoker    Packs/day: 0.50    Years: 48.00    Pack years: 24.00    Types: Cigarettes  . Smokeless tobacco: Never Used  . Tobacco comment: feb 202 at 1/2 to 3/4 pack cigs per day  Vaping Use  . Vaping Use: Never used  Substance and Sexual  Activity  . Alcohol use: No    Alcohol/week: 0.0 standard drinks  . Drug use: No  . Sexual activity: Not Currently    Partners: Female    Birth control/protection: Surgical    Comment: Hyst  Other Topics Concern  . Not on file  Social History Narrative   Right handed    Caffeine use: 3 cups per day   Lives with partner: Gerald Stabs.   Social Determinants of Health   Financial Resource Strain: Not on file  Food Insecurity: Not on file  Transportation Needs: Not on file  Physical Activity: Not on file  Stress: Not on file  Social Connections: Not on file  Intimate Partner Violence: Not on file    FAMILY HISTORY: Family History  Problem Relation Age of Onset  . Heart failure Mother        congestive  . Diabetes Mother   . Hypertension Mother   . Heart disease Father        MI  . Diabetes Father   . Heart attack Father   . Cancer Brother 49       lung cancer  . Cancer Brother        metastasized, unknown origin  . Colon cancer Paternal Grandmother   . Diabetes Brother   . Breast cancer Cousin   . Breast cancer Cousin     ALLERGIES:  is allergic to hydrocodone and percocet [oxycodone-acetaminophen].  MEDICATIONS:  Current Outpatient Medications  Medication Sig Dispense Refill  . acetaminophen (TYLENOL) 500 MG tablet Take 1,000 mg by mouth daily as needed for mild pain. Take 1000 mg daily    . acetaZOLAMIDE (DIAMOX) 250 MG tablet Take 1 tablet (250 mg total) by mouth 2 (two) times daily. 60 tablet 12  . ALPRAZolam (XANAX) 0.5 MG tablet Take 0.25-0.5 mg by mouth 2 (two) times daily as needed for anxiety.     . Brexpiprazole (REXULTI) 2 MG TABS Take by mouth at bedtime.    . Cholecalciferol (VITAMIN D-3) 5000 UNITS TABS Take 1 tablet by mouth daily.    Marland Kitchen desvenlafaxine (PRISTIQ) 100 MG 24 hr tablet Take 100 mg by mouth daily.    . fenofibrate 160 MG tablet Take 160 mg by mouth daily.    Marland Kitchen ibuprofen (ADVIL,MOTRIN) 600 MG tablet Take 1 tablet (600 mg total) by mouth every 6  (six) hours as needed (mild pain). 30 tablet 0  . losartan (COZAAR) 50 MG tablet Take 50 mg by mouth daily.    . magnesium oxide (MAG-OX) 400 MG tablet Take 400 mg by mouth daily.    . metoprolol succinate (TOPROL-XL) 100 MG 24 hr tablet Take 100 mg by mouth daily. Take with or immediately following a meal.    . omeprazole (PRILOSEC) 20 MG capsule Take 20 mg by mouth daily.     No current facility-administered  medications for this visit.    REVIEW OF SYSTEMS:   Constitutional: Denies fevers, chills or abnormal night sweats Eyes: Denies blurriness of vision, double vision or watery eyes Ears, nose, mouth, throat, and face: Denies mucositis or sore throat Respiratory: Denies cough, dyspnea or wheezes Cardiovascular: Denies palpitation, chest discomfort or lower extremity swelling Gastrointestinal:  Denies nausea, heartburn or change in bowel habits Skin: Denies abnormal skin rashes Lymphatics: Denies new lymphadenopathy or easy bruising Neurological:Denies numbness, tingling or new weaknesses Behavioral/Psych: Mood is stable, no new changes  All other systems were reviewed with the patient and are negative.  PHYSICAL EXAMINATION: ECOG PERFORMANCE STATUS: 0 - Asymptomatic  Vitals:   07/25/20 1111  BP: (!) 145/76  Pulse: 62  Resp: 16  Temp: (!) 97.1 F (36.2 C)  SpO2: 100%   Filed Weights   07/25/20 1111  Weight: 157 lb (71.2 kg)    GENERAL:alert, no distress and comfortable SKIN: skin color, texture, turgor are normal, no rashes or significant lesions EYES: normal, conjunctiva are pink and non-injected, sclera clear OROPHARYNX:no exudate, normal lips, buccal mucosa, and tongue  NECK: supple, thyroid normal size, non-tender, without nodularity LYMPH:  no palpable lymphadenopathy in the cervical, axillary or inguinal LUNGS: clear to auscultation and percussion with normal breathing effort HEART: regular rate & rhythm and no murmurs without lower extremity  edema ABDOMEN:abdomen soft, non-tender and normal bowel sounds Musculoskeletal:no cyanosis of digits and no clubbing  PSYCH: alert & oriented x 3 with fluent speech NEURO: no focal motor/sensory deficits  LABORATORY DATA:  I have reviewed the data as listed Lab Results  Component Value Date   WBC 8.7 07/25/2020   HGB 13.6 07/25/2020   HCT 42.0 07/25/2020   MCV 91.9 07/25/2020   PLT 430 (H) 07/25/2020   Recent Labs    07/25/20 1049  NA 140  K 4.0  CL 107  CO2 23  GLUCOSE 116*  BUN 17  CREATININE 1.12*  CALCIUM 9.4  GFRNONAA 55*  PROT 7.9  ALBUMIN 4.4  AST 15  ALT 12  ALKPHOS 61  BILITOT 0.3    ASSESSMENT & PLAN:  MGUS (monoclonal gammopathy of unknown significance) The patient is billed as a new patient because it has been more than 3 years since her last visit Myeloma panel is pending I will call her with results next week If her myeloma panel is stable, we will continue to see her once a year She is in agreement  Tobacco abuse Unfortunately, she continues to smoke We discussed the importance of nicotine cessation   No orders of the defined types were placed in this encounter.   All questions were answered. The patient knows to call the clinic with any problems, questions or concerns. The total time spent in the appointment was 40 minutes encounter with patients including review of chart and various tests results, discussions about plan of care and coordination of care plan   Heath Lark, MD 07/25/2020 2:49 PM

## 2020-07-25 NOTE — Assessment & Plan Note (Signed)
Unfortunately, she continues to smoke We discussed the importance of nicotine cessation

## 2020-07-26 LAB — KAPPA/LAMBDA LIGHT CHAINS
Kappa free light chain: 27.2 mg/L — ABNORMAL HIGH (ref 3.3–19.4)
Kappa, lambda light chain ratio: 1.81 — ABNORMAL HIGH (ref 0.26–1.65)
Lambda free light chains: 15 mg/L (ref 5.7–26.3)

## 2020-07-27 ENCOUNTER — Telehealth: Payer: Self-pay

## 2020-07-27 LAB — MULTIPLE MYELOMA PANEL, SERUM
Albumin SerPl Elph-Mcnc: 4.1 g/dL (ref 2.9–4.4)
Albumin/Glob SerPl: 1.3 (ref 0.7–1.7)
Alpha 1: 0.2 g/dL (ref 0.0–0.4)
Alpha2 Glob SerPl Elph-Mcnc: 0.7 g/dL (ref 0.4–1.0)
B-Globulin SerPl Elph-Mcnc: 1.2 g/dL (ref 0.7–1.3)
Gamma Glob SerPl Elph-Mcnc: 1 g/dL (ref 0.4–1.8)
Globulin, Total: 3.2 g/dL (ref 2.2–3.9)
IgA: 136 mg/dL (ref 87–352)
IgG (Immunoglobin G), Serum: 931 mg/dL (ref 586–1602)
IgM (Immunoglobulin M), Srm: 176 mg/dL (ref 26–217)
M Protein SerPl Elph-Mcnc: 0.5 g/dL — ABNORMAL HIGH
Total Protein ELP: 7.3 g/dL (ref 6.0–8.5)

## 2020-07-27 NOTE — Telephone Encounter (Signed)
Called and given below message. She verbalized understanding. 

## 2020-07-27 NOTE — Telephone Encounter (Signed)
-----   Message from Heath Lark, MD sent at 07/27/2020  3:44 PM EST ----- Regarding: pls call and let her know myeloma panel looks good, stable, see her in 1 year

## 2020-07-31 ENCOUNTER — Other Ambulatory Visit: Payer: Self-pay | Admitting: Hematology and Oncology

## 2020-07-31 ENCOUNTER — Telehealth: Payer: Self-pay | Admitting: Hematology and Oncology

## 2020-07-31 DIAGNOSIS — D472 Monoclonal gammopathy: Secondary | ICD-10-CM

## 2020-07-31 NOTE — Telephone Encounter (Signed)
Scheduled appt per 12/1 sch msg - mailed letter with appt date and time  ° °

## 2020-08-03 DIAGNOSIS — G4733 Obstructive sleep apnea (adult) (pediatric): Secondary | ICD-10-CM | POA: Diagnosis not present

## 2020-08-06 ENCOUNTER — Encounter: Payer: Self-pay | Admitting: Neurology

## 2020-08-06 ENCOUNTER — Other Ambulatory Visit: Payer: Self-pay

## 2020-08-06 ENCOUNTER — Ambulatory Visit (INDEPENDENT_AMBULATORY_CARE_PROVIDER_SITE_OTHER): Payer: Medicare Other | Admitting: Neurology

## 2020-08-06 VITALS — BP 136/72 | HR 52 | Ht 62.0 in | Wt 159.5 lb

## 2020-08-06 DIAGNOSIS — Z9989 Dependence on other enabling machines and devices: Secondary | ICD-10-CM

## 2020-08-06 DIAGNOSIS — D472 Monoclonal gammopathy: Secondary | ICD-10-CM | POA: Diagnosis not present

## 2020-08-06 DIAGNOSIS — G723 Periodic paralysis: Secondary | ICD-10-CM

## 2020-08-06 DIAGNOSIS — R531 Weakness: Secondary | ICD-10-CM | POA: Diagnosis not present

## 2020-08-06 DIAGNOSIS — G4733 Obstructive sleep apnea (adult) (pediatric): Secondary | ICD-10-CM | POA: Diagnosis not present

## 2020-08-06 DIAGNOSIS — Z1589 Genetic susceptibility to other disease: Secondary | ICD-10-CM | POA: Insufficient documentation

## 2020-08-06 MED ORDER — ACETAZOLAMIDE 250 MG PO TABS: 250.0000 mg | ORAL_TABLET | Freq: Two times a day (BID) | ORAL | 12 refills | Status: DC

## 2020-08-06 MED ORDER — ACETAZOLAMIDE 250 MG PO TABS: 250.0000 mg | ORAL_TABLET | Freq: Two times a day (BID) | ORAL | 4 refills | Status: DC

## 2020-08-06 NOTE — Progress Notes (Signed)
[facility]  PATIENT: [name] DOB: 1955/07/23  REFERRING DOCTOR OR PCP:  Dr. Laurann Montana SOURCE: patient, records from Dr. Cliffton Asters, imaging and lab reports  _________________________________   HISTORICAL  CHIEF COMPLAINT:  Chief Complaint  Patient presents with  . Follow-up    RM 12, alone. Last seen 07/21/19. On CPAP (DME: [facility]) Doing well on this, wears about 8-9 hr per night. No issues.  MGUS- On Diamox.  Pt denies any new sx. Having "mini  episodes" where she feels weak/shaky. Occurs very randomly. Had 2 episodes in a 3 week period about 2 month ago. None since. Total of 5 episodes since last seen.    HISTORY OF PRESENT ILLNESS:   [name] is a 65 y.o. woman wih episodes of weakness.  Update 08/06/2020: She has periodic paralysis.   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   She has no family history of episodic weakness.    Since the last visit, she reports doing well --- she has had 5 minor episodes lasting just 15-30 minutes with mild weakness.     She started Diamox in 2018 and she has not had a single major spell since starting (prior to Diamox, she was having 3-5 spells of weakness a week lasting hours at a time).     She is on Diamox 250 mg po bi  She has IgG kappa MGUS (diagnosed in 2013) and sees Dr. Bertis Ruddy.   Her M-spike was 0.3 in 2018. She had a BM biopsy and bone survey at diagnosis.    She denies numbness as would be expected if she had polyneuropathy.  Balance is fine and she does not need to hold on to the wall when eyes closed (I.e shampooing hair).      Bone survey in 2017 showed stable lytic inferior pubic rami and stable subtle skull lucencies.    She sees hematology and is continuing with watchful waiting.    Depression and anxiety are doing well (sees Marchelle Folks)  In 2017, she was diagnosed with severe obstructive  sleep apnea. She was placed on CPAP by Dr. Earl Gala [facility]) . Sleepiness improved with every night use.       REVIEW OF SYSTEMS: Constitutional: No fevers, chills, sweats, or change in appetite Eyes: No visual changes, double vision, eye pain Ear, nose and throat: No hearing loss, ear pain, nasal congestion, sore throat Cardiovascular: No chest pain, palpitations Respiratory: No shortness of breath at rest or with exertion.   No wheezes GastrointestinaI: No nausea, vomiting, diarrhea, abdominal pain, fecal incontinence.  She has GERD Genitourinary: No dysuria, urinary retention or frequency.  No nocturia. Musculoskeletal: No neck pain, back pain Integumentary: No rash, pruritus, skin lesions Neurological: as above Psychiatric: Notes depression and anxiety Endocrine: No palpitations, diaphoresis, change in appetite, change in weigh or increased thirst Hematologic/Lymphatic: No anemia, purpura, petechiae.   She has MGUS Allergic/Immunologic: No itchy/runny eyes, nasal congestion, recent allergic reactions, rashes  ALLERGIES: Allergies  Allergen Reactions  . Hydrocodone Other (See Comments)    Headache  . Percocet [Oxycodone-Acetaminophen] Other (See Comments)    Causes headaches    HOME MEDICATIONS:  Current Outpatient Medications:  .  acetaminophen (TYLENOL) 500 MG tablet, Take 1,000 mg by mouth daily as needed for mild pain. Take 1000 mg daily, Disp: , Rfl:  .  ALPRAZolam (XANAX) 0.5 MG tablet, Take 0.25-0.5 mg by mouth 2 (two) times daily as  needed for anxiety. , Disp: , Rfl:  .  brexpiprazole (REXULTI) 2 MG TABS tablet, Take by mouth at bedtime., Disp: , Rfl:  .  Cholecalciferol (VITAMIN D-3) 5000 UNITS TABS, Take 1 tablet by mouth daily., Disp: , Rfl:  .  desvenlafaxine (PRISTIQ) 100 MG 24 hr tablet, Take 100 mg by mouth daily., Disp: , Rfl:  .  fenofibrate 160 MG tablet, Take 160 mg by mouth daily., Disp: , Rfl:  .  ibuprofen (ADVIL,MOTRIN) 600 MG tablet, Take 1 tablet  (600 mg total) by mouth every 6 (six) hours as needed (mild pain)., Disp: 30 tablet, Rfl: 0 .  losartan (COZAAR) 50 MG tablet, Take 50 mg by mouth daily., Disp: , Rfl:  .  magnesium oxide (MAG-OX) 400 MG tablet, Take 400 mg by mouth daily., Disp: , Rfl:  .  metoprolol succinate (TOPROL-XL) 100 MG 24 hr tablet, Take 100 mg by mouth daily. Take with or immediately following a meal., Disp: , Rfl:  .  omeprazole (PRILOSEC) 20 MG capsule, Take 20 mg by mouth daily., Disp: , Rfl:  .  acetaZOLAMIDE (DIAMOX) 250 MG tablet, Take 1 tablet (250 mg total) by mouth 2 (two) times daily., Disp: 180 tablet, Rfl: 4  PAST MEDICAL HISTORY: Past Medical History:  Diagnosis Date  . Cervical disc disease   . Complication of anesthesia    prolonged sedation  . Depression   . GERD (gastroesophageal reflux disease)   . HTN (hypertension)   . Hyperlipidemia   . MGUS (monoclonal gammopathy of unknown significance) 03/15/2015  . Migraine headache   . Mitral valve prolapse   . Monoclonal (M) protein disease, multiple 'M' protein   . Osteoarthritis    generalized  . PONV (postoperative nausea and vomiting)   . Sleep apnea   . Vision abnormalities     PAST SURGICAL HISTORY: Past Surgical History:  Procedure Laterality Date  . ANTERIOR AND POSTERIOR REPAIR N/A 10/16/2015   Procedure: ANTERIOR (CYSTOCELE) AND POSTERIOR REPAIR (RECTOCELE);  Surgeon: Nunzio Cobbs, MD;  Location: [facility] [place] [facility];  Service: Gynecology;  Laterality: N/A;  . APPENDECTOMY  1974  . BLADDER SUSPENSION N/A 10/16/2015   Procedure: TRANSVAGINAL TAPE (TVT) PROCEDURE exact midurethral sling;  Surgeon: Nunzio Cobbs, MD;  Location: [place] [place] [facility];  Service: Gynecology;  Laterality: N/A;  . BREAST BIOPSY     negative x 2   . BREAST EXCISIONAL BIOPSY Left 1998  . BREAST EXCISIONAL BIOPSY Right 1986  . BUNIONECTOMY     w/hammer toe repair  . CARPAL TUNNEL RELEASE Right   . CERVICAL DISC SURGERY  6/06  . CYSTO N/A 10/16/2015   Procedure:  Kathrene Alu;  Surgeon: Nunzio Cobbs, MD;  Location: [facility];  Service: Gynecology;  Laterality: N/A;  . CYSTOSCOPY N/A 02/26/2016   Procedure: CYSTOSCOPY;  Surgeon: Nunzio Cobbs, MD;  Location: [place] [place] [facility];  Service: Gynecology;  Laterality: N/A;  . KNEE ARTHROSCOPY     right knee x 2  . MENISCUS REPAIR     right knee x 1  . TENS     upper and lower back  . TONSILECTOMY, ADENOIDECTOMY, BILATERAL MYRINGOTOMY AND TUBES    . TOTAL VAGINAL HYSTERECTOMY  05/2004   adenomyosis, prolapse--ovaries remain  . TRANSVAGINAL TAPE (TVT) REMOVAL N/A 02/26/2016   Procedure: Lysis of mid urethral sling, Anterior Colporrhaphy, Cystoscopy ;  Surgeon: Nunzio Cobbs, MD;  Location: [facility];  Service: Gynecology;  Laterality: N/A;  first  case/ move first case to follow this. Need 1 hour/   . TUBAL LIGATION      FAMILY HISTORY: Family History  Problem Relation Age of Onset  . Heart failure Mother        congestive  . Diabetes Mother   . Hypertension Mother   . Heart disease Father        MI  . Diabetes Father   . Heart attack Father   . Cancer Brother 25       lung cancer  . Cancer Brother        metastasized, unknown origin  . Colon cancer Paternal Grandmother   . Diabetes Brother   . Breast cancer Cousin   . Breast cancer Cousin     SOCIAL HISTORY:  Social History   Socioeconomic History  . Marital status: Single    Spouse name: Not on file  . Number of children: 2  . Years of education: Not on file  . Highest education level: Not on file  Occupational History    Comment: Intake coordinator  Tobacco Use  . Smoking status: Current Every Day Smoker    Packs/day: 0.50    Years: 48.00    Pack years: 24.00    Types: Cigarettes  . Smokeless tobacco: Never Used  . Tobacco comment: feb 202 at 1/2 to 3/4 pack cigs per day  Vaping Use  . Vaping Use: Never used  Substance and Sexual Activity  . Alcohol use: No    Alcohol/week: 0.0 standard drinks  . Drug use: No  .  Sexual activity: Not Currently    Partners: Female    Birth control/protection: Surgical    Comment: Hyst  Other Topics Concern  . Not on file  Social History Narrative   Right handed    Caffeine use: 3 cups per day   Lives with partner: Gerald Stabs.   Social Determinants of Health   Financial Resource Strain: Not on file  Food Insecurity: Not on file  Transportation Needs: Not on file  Physical Activity: Not on file  Stress: Not on file  Social Connections: Not on file  Intimate Partner Violence: Not on file     PHYSICAL EXAM  Vitals:   08/06/20 1257  BP: 136/72  Pulse: (!) 52  Weight: 159 lb 8 oz (72.3 kg)  Height: 5\' 2"  (1.575 m)    Body mass index is 29.17 kg/m.   Gen. exam: The head is normocephalic and atraumatic. The neck is nontender. Neck has a good range of motion.  Mental status: She is alert and fully oriented with fluent speech. Memory and attention appear to be appropriate.  Cranial nerves: Extraocular muscles are intact. Pupils react to light and accommodation. Facial strength and sensation is normal. Trapezius strength is normal.   Hearing symmetric  Motor: Muscle bulk is normal. Muscle tone is normal.  Strength is 5/5 in the arms and legs.  No myotonia . Sensory: She has intact sensation to touch and vibration in the arms and legs  Coordination: Finger-nose-finger and heel-to-shin is performed well.  Gait: The station is normal. The gait and tandem walk are normal for age.  DTRs: Normal and symmetric in arms and knees    DIAGNOSTIC DATA (LABS, IMAGING, TESTING) - I reviewed patient records, labs, notes, testing and imaging myself where available.  Lab Results  Component Value Date   WBC 8.7 07/25/2020   HGB 13.6 07/25/2020   HCT 42.0 07/25/2020   MCV 91.9 07/25/2020  PLT 430 (H) 07/25/2020      Component Value Date/Time   NA 140 07/25/2020 1049   NA 140 03/06/2017 1027   K 4.0 07/25/2020 1049   K 4.5 03/06/2017 1027   CL 107  07/25/2020 1049   CL 103 06/11/2012 1059   CO2 23 07/25/2020 1049   CO2 25 03/06/2017 1027   GLUCOSE 116 (H) 07/25/2020 1049   GLUCOSE 111 03/06/2017 1027   GLUCOSE 123 (H) 06/11/2012 1059   BUN 17 07/25/2020 1049   BUN 17.2 03/06/2017 1027   CREATININE 1.12 (H) 07/25/2020 1049   CREATININE 1.0 03/06/2017 1027   CALCIUM 9.4 07/25/2020 1049   CALCIUM 10.0 03/06/2017 1027   PROT 7.9 07/25/2020 1049   PROT 7.5 03/06/2017 1027   PROT 7.0 03/06/2017 1027   ALBUMIN 4.4 07/25/2020 1049   ALBUMIN 4.0 03/06/2017 1027   AST 15 07/25/2020 1049   AST 14 03/06/2017 1027   ALT 12 07/25/2020 1049   ALT 12 03/06/2017 1027   ALKPHOS 61 07/25/2020 1049   ALKPHOS 60 03/06/2017 1027   BILITOT 0.3 07/25/2020 1049   BILITOT 0.30 03/06/2017 1027   GFRNONAA 55 (L) 07/25/2020 1049   GFRAA 72 11/21/2016 0951       ASSESSMENT AND PLAN  Periodic paralysis  Episodic weakness  MGUS (monoclonal gammopathy of unknown significance)  OSA on CPAP   1.   Continue Diamox and try to avoid high carbohydrate intake.  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    Continue 250 mg p obid  acetazolamide dose and increase to 3 pills a day if more frequent or more severe spells occur. 2.    Continue CPAP ([facility] Maxwell Caul at [facility] follows) 3.     Return in 12 months or sooner if there are new or worsening neurologic symptoms.     Can alternate between seeing me and a nurse practitioner   Monique Gift A. Felecia Shelling, MD, PhD 12/11/3014, 0:10 PM Certified in Neurology, Clinical Neurophysiology, Sleep Medicine, Pain Medicine and Neuroimaging  [facility] [address], [place], [place] [address] [phone]

## 2020-08-27 DIAGNOSIS — Z23 Encounter for immunization: Secondary | ICD-10-CM | POA: Diagnosis not present

## 2020-08-27 DIAGNOSIS — E782 Mixed hyperlipidemia: Secondary | ICD-10-CM | POA: Diagnosis not present

## 2020-08-27 DIAGNOSIS — E559 Vitamin D deficiency, unspecified: Secondary | ICD-10-CM | POA: Diagnosis not present

## 2020-08-27 DIAGNOSIS — R7303 Prediabetes: Secondary | ICD-10-CM | POA: Diagnosis not present

## 2020-08-27 DIAGNOSIS — I10 Essential (primary) hypertension: Secondary | ICD-10-CM | POA: Diagnosis not present

## 2020-09-17 NOTE — Progress Notes (Signed)
GYNECOLOGY  VISIT   HPI: 65 y.o.   Single  Caucasian  female   [id number] with Patient's last menstrual period was 03/07/2004 (approximate).   here for breast and pelvic exam.   Patient with internal vaginal pain off & on for 3 - 4 months. Worse at night and urinating make it worse. Pain is minor and comes and goes.  Pain is a 2 or 3/10 usually, but this past weekend increased to a 3.5/10. Pain can last 5 - 10 seconds.  Also feels pain sometimes with a cough.  When she lays on her back at night, she notices it more.  No relationship to fullness of bladder.  No blood in the urine.  No vaginal bleeding.   No fevers.  No back pain outside of usual back pain.  Voiding well.  Very little leakage noted on her pad.  No large leakage realted to laugh or cough or sneeze.  Bowel function is normal.   Not sexually active.  Thinks it is due to [place], which is helpful for her.   Trying to quit tobacco and is vaping.   GYNECOLOGIC HISTORY: Patient's last menstrual period was 03/07/2004 (approximate). Contraception: Hyst Menopausal hormone therapy: None Last mammogram: 03-20-20 3D/neg/BiRads1 Last pap smear:  04-21-26 Neg        OB History    Gravida  3   Para  2   Term  2   Preterm  0   AB  1   Living  2     SAB  1   IAB  0   Ectopic  0   Multiple  0   Live Births  2              Patient Active Problem List   Diagnosis Date Noted  . 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 Pain in right hip 03/04/2018  . Episodic weakness 10/08/2016  . Depression 10/08/2016  . OSA on CPAP 10/08/2016  . Status post surgery 10/16/2015  . MGUS (monoclonal gammopathy of unknown significance) 03/15/2015  . Tobacco abuse 03/14/2014  . Monoclonal (M) protein disease, multiple 'M' protein   . Osteoarthritis   . Mitral valve prolapse   . HTN (hypertension)   . Hyperlipidemia     Past Medical History:  Diagnosis Date  . Cervical  disc disease   . Complication of anesthesia    prolonged sedation  . Depression   . GERD (gastroesophageal reflux disease)   . HTN (hypertension)   . Hyperlipidemia   . MGUS (monoclonal gammopathy of unknown significance) 03/15/2015  . Migraine headache   . Mitral valve prolapse   . Monoclonal (M) protein disease, multiple 'M' protein   . Osteoarthritis    generalized  . PONV (postoperative nausea and vomiting)   . Sleep apnea   . Vision abnormalities     Past Surgical History:  Procedure Laterality Date  . ANTERIOR AND POSTERIOR REPAIR N/A 10/16/2015   Procedure: ANTERIOR (CYSTOCELE) AND POSTERIOR REPAIR (RECTOCELE);  Surgeon: Nunzio Cobbs, MD;  Location: [facility];  Service: Gynecology;  Laterality: N/A;  . APPENDECTOMY  1974  . BLADDER SUSPENSION N/A 10/16/2015   Procedure: TRANSVAGINAL TAPE (TVT) PROCEDURE exact midurethral sling;  Surgeon: Nunzio Cobbs, MD;  Location: [facility];  Service: Gynecology;  Laterality: N/A;  . BREAST BIOPSY     negative x 2   . BREAST EXCISIONAL BIOPSY Left 1998  . BREAST EXCISIONAL  BIOPSY Right 1986  . BUNIONECTOMY     w/hammer toe repair  . CARPAL TUNNEL RELEASE Right   . CERVICAL DISC SURGERY  6/06  . CYSTO N/A 10/16/2015   Procedure: Kathrene Alu;  Surgeon: Nunzio Cobbs, MD;  Location: [facility];  Service: Gynecology;  Laterality: N/A;  . CYSTOSCOPY N/A 02/26/2016   Procedure: CYSTOSCOPY;  Surgeon: Nunzio Cobbs, MD;  Location: [facility];  Service: Gynecology;  Laterality: N/A;  . KNEE ARTHROSCOPY     right knee x 2  . MENISCUS REPAIR     right knee x 1  . TENS     upper and lower back  . TONSILECTOMY, ADENOIDECTOMY, BILATERAL MYRINGOTOMY AND TUBES    . TOTAL VAGINAL HYSTERECTOMY  05/2004   adenomyosis, prolapse--ovaries remain  . TRANSVAGINAL TAPE (TVT) REMOVAL N/A 02/26/2016   Procedure: Lysis of mid urethral sling, Anterior Colporrhaphy, Cystoscopy ;  Surgeon: Nunzio Cobbs, MD;  Location: [facility];   Service: Gynecology;  Laterality: N/A;  first case/ move first case to follow this. Need 1 hour/   . TUBAL LIGATION      Current Outpatient Medications  Medication Sig Dispense Refill  . acetaminophen (TYLENOL) 500 MG tablet Take 1,000 mg by mouth daily as needed for mild pain. Take 1000 mg daily    . acetaZOLAMIDE (DIAMOX) 250 MG tablet 1 tablet    . ALPRAZolam (XANAX) 0.5 MG tablet Take 0.25-0.5 mg by mouth 2 (two) times daily as needed for anxiety.     . brexpiprazole (REXULTI) 2 MG TABS tablet 1 tablet    . Cholecalciferol (VITAMIN D-3) 5000 UNITS TABS Take 1 tablet by mouth daily.    Marland Kitchen desvenlafaxine (PRISTIQ) 100 MG 24 hr tablet Take 100 mg by mouth daily.    Marland Kitchen estradiol (ESTRACE) 0.1 MG/GM vaginal cream Use 1/2 g vaginally every night for the first 2 weeks, then use 1/2 g vaginally two or three times per week as needed to maintain symptom relief. 42.5 g 1  . fenofibrate 160 MG tablet Take 160 mg by mouth daily.    Marland Kitchen ibuprofen (ADVIL,MOTRIN) 600 MG tablet Take 1 tablet (600 mg total) by mouth every 6 (six) hours as needed (mild pain). 30 tablet 0  . losartan (COZAAR) 50 MG tablet Take 1 tablet by mouth daily.    . magnesium oxide (MAG-OX) 400 MG tablet Take 400 mg by mouth daily.    . metoprolol succinate (TOPROL-XL) 100 MG 24 hr tablet Take 1 tablet by mouth daily.    Marland Kitchen omeprazole (PRILOSEC) 20 MG capsule Take 20 mg by mouth daily.    Marland Kitchen triamcinolone (NASACORT) 55 MCG/ACT AERO nasal inhaler Place 2 sprays into both nostrils daily.     No current facility-administered medications for this visit.     ALLERGIES: Aripiprazole, Hydrocodone, Other, and Percocet [oxycodone-acetaminophen]  Family History  Problem Relation Age of Onset  . Heart failure Mother        congestive  . Diabetes Mother   . Hypertension Mother   . Heart disease Father        MI  . Diabetes Father   . Heart attack Father   . Cancer Brother 53       lung cancer  . Cancer Brother        metastasized,  unknown origin  . Colon cancer Paternal Grandmother   . Diabetes Brother   . Breast cancer Cousin   . Breast cancer Cousin  Social History   Socioeconomic History  . Marital status: Single    Spouse name: Not on file  . Number of children: 2  . Years of education: Not on file  . Highest education level: Not on file  Occupational History    Comment: Intake coordinator  Tobacco Use  . Smoking status: Current Every Day Smoker    Packs/day: 0.25    Years: 48.00    Pack years: 12.00    Types: Cigarettes  . Smokeless tobacco: Never Used  . Tobacco comment: feb 202 at 1/2 to 3/4 pack cigs per day  Vaping Use  . Vaping Use: Never used  Substance and Sexual Activity  . Alcohol use: No    Alcohol/week: 0.0 standard drinks  . Drug use: No  . Sexual activity: Not Currently    Partners: Female    Birth control/protection: Surgical    Comment: Hyst  Other Topics Concern  . Not on file  Social History Narrative   Right handed    Caffeine use: 3 cups per day   Lives with partner: Gerald Stabs.   Social Determinants of Health   Financial Resource Strain: Not on file  Food Insecurity: Not on file  Transportation Needs: Not on file  Physical Activity: Not on file  Stress: Not on file  Social Connections: Not on file  Intimate Partner Violence: Not on file    Review of Systems  Genitourinary: Positive for dysuria.  All other systems reviewed and are negative.   PHYSICAL EXAMINATION:    BP 136/84   Pulse 76   Ht 5' 2.5" (1.588 m)   Wt 158 lb (71.7 kg)   LMP 03/07/2004 (Approximate)   SpO2 91%   BMI 28.44 kg/m     General appearance: alert, cooperative and appears stated age Head: Normocephalic, without obvious abnormality, atraumatic Lungs: clear to auscultation bilaterally Breasts: normal appearance, no masses or tenderness, No nipple retraction or dimpling, No nipple discharge or bleeding, No axillary or supraclavicular adenopathy Heart: regular rate and  rhythm Abdomen: soft, non-tender, no masses,  no organomegaly No abnormal inguinal nodes palpated Neurologic: Grossly normal  Pelvic: External genitalia:  no lesions              Urethra:  normal appearing urethra with no masses, tenderness or lesions              Bartholins and Skenes: normal                 Vagina: normal appearing vagina with normal color and discharge, no lesions.  First degree cystocele.   Tender anteriorly.                Cervix: absent                Bimanual Exam:  Uterus:  Absent.              Adnexa: no mass, fullness, tenderness              Rectal exam: Yes.  .  Confirms.              Anus:  normal sphincter tone, no lesions  Chaperone was present for exam.  ASSESSMENT  Status post TVH - fibroids, adenomyosis. Ovaries remain.  Status post A and P repair with TVT and cysto.  Status post lysis of midurethral sling. Mild GSI.  Screening breast exam.  Pelvic exam with abnormal finding of tenderness.  Vaginal pain/pelvic pain.  MGUS.  [place]  multiple cancers.  Genetic testing negative. Periodic paralysis.   Hx decreased libido.  Smoker.  Decreasing.   Sebaceous cyst of the skin of right medial breast.    PLAN  Vaginal estradiol cream.  Potential effect on breast caner reviewed.  Return for pelvic US to check ovaries.  Mammogram discussed.  SBE discussed.  Urinalysis:  0 - 5 WBC, NS RBC, few bacteria, many amorphous sediment.   UC sent.  I recommended against vaping.  Consider next bone density in 2023 due to smoking status.    30 min  total time was spent for this patient encounter, including preparation, face-to-face counseling with the patient, coordination of care, and documentation of the encounter.

## 2020-09-18 ENCOUNTER — Ambulatory Visit (INDEPENDENT_AMBULATORY_CARE_PROVIDER_SITE_OTHER): Payer: Medicare Other | Admitting: Obstetrics and Gynecology

## 2020-09-18 ENCOUNTER — Encounter: Payer: Self-pay | Admitting: Obstetrics and Gynecology

## 2020-09-18 ENCOUNTER — Other Ambulatory Visit: Payer: Self-pay

## 2020-09-18 VITALS — BP 136/84 | HR 76 | Ht 62.5 in | Wt 158.0 lb

## 2020-09-18 DIAGNOSIS — Z01411 Encounter for gynecological examination (general) (routine) with abnormal findings: Secondary | ICD-10-CM | POA: Diagnosis not present

## 2020-09-18 DIAGNOSIS — R102 Pelvic and perineal pain: Secondary | ICD-10-CM

## 2020-09-18 DIAGNOSIS — R300 Dysuria: Secondary | ICD-10-CM

## 2020-09-18 DIAGNOSIS — Z1239 Encounter for other screening for malignant neoplasm of breast: Secondary | ICD-10-CM

## 2020-09-18 MED ORDER — ESTRADIOL 0.1 MG/GM VA CREA: TOPICAL_CREAM | VAGINAL | 1 refills | Status: DC

## 2020-09-20 LAB — URINALYSIS, COMPLETE W/RFL CULTURE
Bilirubin Urine: NEGATIVE
Glucose, UA: NEGATIVE
Hgb urine dipstick: NEGATIVE
Hyaline Cast: NONE SEEN /LPF
Ketones, ur: NEGATIVE
Nitrites, Initial: NEGATIVE
Protein, ur: NEGATIVE
RBC / HPF: NONE SEEN /HPF (ref 0–2)
Specific Gravity, Urine: 1.015 (ref 1.001–1.03)
pH: 7.5 (ref 5.0–8.0)

## 2020-09-20 LAB — URINE CULTURE
MICRO NUMBER:: 11648906
Result:: NO GROWTH
SPECIMEN QUALITY:: ADEQUATE

## 2020-09-20 LAB — CULTURE INDICATED

## 2020-09-24 ENCOUNTER — Other Ambulatory Visit: Payer: Self-pay

## 2020-09-24 ENCOUNTER — Ambulatory Visit (INDEPENDENT_AMBULATORY_CARE_PROVIDER_SITE_OTHER)
Admission: RE | Admit: 2020-09-24 | Discharge: 2020-09-24 | Disposition: A | Discharge status: Home / Self Care | Payer: Medicare Other | Source: Ambulatory Visit | Attending: Acute Care | Admitting: Acute Care

## 2020-09-24 DIAGNOSIS — F17210 Nicotine dependence, cigarettes, uncomplicated: Secondary | ICD-10-CM

## 2020-10-05 NOTE — Progress Notes (Signed)
Please call patient and let them  know their  low dose Ct was read as a Lung RADS 2: nodules that are benign in appearance and behavior with a very low likelihood of becoming a clinically active cancer due to size or lack of growth. Recommendation per radiology is for a repeat LDCT in 12 months. .Please let them  know we will order and schedule their  annual screening scan for 09/2021. Please let them  know there was notation of CAD on their  scan.  Please remind the patient  that this is a non-gated exam therefore degree or severity of disease  cannot be determined. Please have them  follow up with their PCP regarding potential risk factor modification, dietary therapy or pharmacologic therapy if clinically indicated. Pt.  is  currently on statin therapy. Please place order for annual  screening scan for  09/2021 and fax results to PCP. Thanks so much.

## 2020-10-08 ENCOUNTER — Other Ambulatory Visit: Payer: Self-pay | Admitting: *Deleted

## 2020-10-08 DIAGNOSIS — F17210 Nicotine dependence, cigarettes, uncomplicated: Secondary | ICD-10-CM

## 2020-10-08 DIAGNOSIS — Z87891 Personal history of nicotine dependence: Secondary | ICD-10-CM

## 2020-10-16 ENCOUNTER — Ambulatory Visit (INDEPENDENT_AMBULATORY_CARE_PROVIDER_SITE_OTHER): Payer: Medicare Other

## 2020-10-16 ENCOUNTER — Other Ambulatory Visit: Payer: Self-pay

## 2020-10-16 ENCOUNTER — Encounter: Payer: Self-pay | Admitting: Obstetrics and Gynecology

## 2020-10-16 ENCOUNTER — Ambulatory Visit (INDEPENDENT_AMBULATORY_CARE_PROVIDER_SITE_OTHER): Payer: Medicare Other | Admitting: Obstetrics and Gynecology

## 2020-10-16 VITALS — BP 142/76 | HR 64 | Ht 62.0 in | Wt 158.0 lb

## 2020-10-16 DIAGNOSIS — R102 Pelvic and perineal pain: Secondary | ICD-10-CM

## 2020-10-16 NOTE — Patient Instructions (Addendum)
Please return for a pessary fitting.    Gabapentin capsules or tablets What is this medicine? GABAPENTIN (GA ba pen tin) is used to control seizures in certain types of epilepsy. It is also used to treat certain types of nerve pain. This medicine may be used for other purposes; ask your health care provider or pharmacist if you have questions. COMMON BRAND NAME(S): Active-PAC with Gabapentin, Orpha Bur, Gralise, Neurontin What should I tell my health care provider before I take this medicine? They need to know if you have any of these conditions:  history of drug abuse or alcohol abuse problem  kidney disease  lung or breathing disease  suicidal thoughts, plans, or attempt; a previous suicide attempt by you or a family member  an unusual or allergic reaction to gabapentin, other medicines, foods, dyes, or preservatives  pregnant or trying to get pregnant  breast-feeding How should I use this medicine? Take this medicine by mouth with a glass of water. Follow the directions on the prescription label. You can take it with or without food. If it upsets your stomach, take it with food. Take your medicine at regular intervals. Do not take it more often than directed. Do not stop taking except on your doctor's advice. If you are directed to break the 600 or 800 mg tablets in half as part of your dose, the extra half tablet should be used for the next dose. If you have not used the extra half tablet within 28 days, it should be thrown away. A special MedGuide will be given to you by the pharmacist with each prescription and refill. Be sure to read this information carefully each time. Talk to your pediatrician regarding the use of this medicine in children. While this drug may be prescribed for children as young as 3 years for selected conditions, precautions do apply. Overdosage: If you think you have taken too much of this medicine contact a poison control center or emergency room at  once. NOTE: This medicine is only for you. Do not share this medicine with others. What if I miss a dose? If you miss a dose, take it as soon as you can. If it is almost time for your next dose, take only that dose. Do not take double or extra doses. What may interact with this medicine? This medicine may interact with the following medications:  alcohol  antihistamines for allergy, cough, and cold  certain medicines for anxiety or sleep  certain medicines for depression like amitriptyline, fluoxetine, sertraline  certain medicines for seizures like phenobarbital, primidone  certain medicines for stomach problems  general anesthetics like halothane, isoflurane, methoxyflurane, propofol  local anesthetics like lidocaine, pramoxine, tetracaine  medicines that relax muscles for surgery  narcotic medicines for pain  phenothiazines like chlorpromazine, mesoridazine, prochlorperazine, thioridazine This list may not describe all possible interactions. Give your health care provider a list of all the medicines, herbs, non-prescription drugs, or dietary supplements you use. Also tell them if you smoke, drink alcohol, or use illegal drugs. Some items may interact with your medicine. What should I watch for while using this medicine? Visit your doctor or health care provider for regular checks on your progress. You may want to keep a record at home of how you feel your condition is responding to treatment. You may want to share this information with your doctor or health care provider at each visit. You should contact your doctor or health care provider if your seizures get worse or if you  have any new types of seizures. Do not stop taking this medicine or any of your seizure medicines unless instructed by your doctor or health care provider. Stopping your medicine suddenly can increase your seizures or their severity. This medicine may cause serious skin reactions. They can happen weeks to  months after starting the medicine. Contact your health care provider right away if you notice fevers or flu-like symptoms with a rash. The rash may be red or purple and then turn into blisters or peeling of the skin. Or, you might notice a red rash with swelling of the face, lips or lymph nodes in your neck or under your arms. Wear a medical identification bracelet or chain if you are taking this medicine for seizures, and carry a card that lists all your medications. You may get drowsy, dizzy, or have blurred vision. Do not drive, use machinery, or do anything that needs mental alertness until you know how this medicine affects you. To reduce dizzy or fainting spells, do not sit or stand up quickly, especially if you are an older patient. Alcohol can increase drowsiness and dizziness. Avoid alcoholic drinks. Your mouth may get dry. Chewing sugarless gum or sucking hard candy, and drinking plenty of water will help. The use of this medicine may increase the chance of suicidal thoughts or actions. Pay special attention to how you are responding while on this medicine. Any worsening of mood, or thoughts of suicide or dying should be reported to your health care provider right away. Women who become pregnant while using this medicine may enroll in the [place] Pregnancy Registry by calling [phone]. This registry collects information about the safety of antiepileptic drug use during pregnancy. What side effects may I notice from receiving this medicine? Side effects that you should report to your doctor or health care professional as soon as possible:  allergic reactions like skin rash, itching or hives, swelling of the face, lips, or tongue  breathing problems  rash, fever, and swollen lymph nodes  redness, blistering, peeling or loosening of the skin, including inside the mouth  suicidal thoughts, mood changes Side effects that usually do not require medical attention  (report to your doctor or health care professional if they continue or are bothersome):  dizziness  drowsiness  headache  nausea, vomiting  swelling of ankles, feet, hands  tiredness This list may not describe all possible side effects. Call your doctor for medical advice about side effects. You may report side effects to FDA at 1-800-FDA-1088. Where should I keep my medicine? Keep out of reach of children. This medicine may cause accidental overdose and death if it taken by other adults, children, or pets. Mix any unused medicine with a substance like cat litter or coffee grounds. Then throw the medicine away in a sealed container like a sealed bag or a coffee can with a lid. Do not use the medicine after the expiration date. Store at room temperature between 15 and 30 degrees C (59 and 86 degrees F). NOTE: This sheet is a summary. It may not cover all possible information. If you have questions about this medicine, talk to your doctor, pharmacist, or health care provider.  2021 Elsevier/Gold Standard (2018-09-24 14:16:43)

## 2020-10-16 NOTE — Progress Notes (Signed)
GYNECOLOGY  VISIT   HPI: 65 y.o.   Single  Caucasian  female   [id number] with Patient's last menstrual period was 03/07/2004 (approximate).   here for pelvic ultrasound for vaginal pain onset end of 2021/beginning 2022.   Urination makes the pain worse.  Negative urine culture on 09/18/20.   The vaginal estrogen cream helps the pain at night.   When her bladder is full, she has pain, and she has pain with voiding but not every time.  It feels like it is occurring more frequently.   Pain seems to be both bladder and vaginal.   Started working out at the beginning of the year.   Has chronic back pain.  Used to take Cymbalta, but this did not help.  Did not take Gabapentin.   Does have hot flashes.   GYNECOLOGIC HISTORY: Patient's last menstrual period was 03/07/2004 (approximate). Contraception:  Hyst Menopausal hormone therapy:  None Last mammogram: 03-20-20 3D/neg/BiRads1 Last pap smear: 04-21-26 Neg        OB History    Gravida  3   Para  2   Term  2   Preterm  0   AB  1   Living  2     SAB  1   IAB  0   Ectopic  0   Multiple  0   Live Births  2              Patient Active Problem List   Diagnosis Date Noted  . Monoallelic mutation of ZCHYI5O gene 08/06/2020  . 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 Pain in right hip 03/04/2018  . Episodic weakness 10/08/2016  . Depression 10/08/2016  . OSA on CPAP 10/08/2016  . Status post surgery 10/16/2015  . MGUS (monoclonal gammopathy of unknown significance) 03/15/2015  . Tobacco abuse 03/14/2014  . Monoclonal (M) protein disease, multiple 'M' protein   . Osteoarthritis   . Mitral valve prolapse   . HTN (hypertension)   . Hyperlipidemia     Past Medical History:  Diagnosis Date  . Cervical disc disease   . Complication of anesthesia    prolonged sedation  . Depression   . GERD (gastroesophageal reflux disease)   . HTN (hypertension)   . Hyperlipidemia   . MGUS (monoclonal gammopathy of unknown  significance) 03/15/2015  . Migraine headache   . Mitral valve prolapse   . Monoclonal (M) protein disease, multiple 'M' protein   . Osteoarthritis    generalized  . PONV (postoperative nausea and vomiting)   . Sleep apnea   . Vision abnormalities     Past Surgical History:  Procedure Laterality Date  . ANTERIOR AND POSTERIOR REPAIR N/A 10/16/2015   Procedure: ANTERIOR (CYSTOCELE) AND POSTERIOR REPAIR (RECTOCELE);  Surgeon: Nunzio Cobbs, MD;  Location: [facility];  Service: Gynecology;  Laterality: N/A;  . APPENDECTOMY  1974  . BLADDER SUSPENSION N/A 10/16/2015   Procedure: TRANSVAGINAL TAPE (TVT) PROCEDURE exact midurethral sling;  Surgeon: Nunzio Cobbs, MD;  Location: [facility];  Service: Gynecology;  Laterality: N/A;  . BREAST BIOPSY     negative x 2   . BREAST EXCISIONAL BIOPSY Left 1998  . BREAST EXCISIONAL BIOPSY Right 1986  . BUNIONECTOMY     w/hammer toe repair  . CARPAL TUNNEL RELEASE Right   . CERVICAL DISC SURGERY  6/06  . CYSTO N/A 10/16/2015   Procedure: Kathrene Alu;  Surgeon: Nunzio Cobbs, MD;  Location: [facility];  Service: Gynecology;  Laterality: N/A;  . CYSTOSCOPY N/A 02/26/2016   Procedure: CYSTOSCOPY;  Surgeon: Nunzio Cobbs, MD;  Location: [facility];  Service: Gynecology;  Laterality: N/A;  . KNEE ARTHROSCOPY     right knee x 2  . MENISCUS REPAIR     right knee x 1  . TENS     upper and lower back  . TONSILECTOMY, ADENOIDECTOMY, BILATERAL MYRINGOTOMY AND TUBES    . TOTAL VAGINAL HYSTERECTOMY  05/2004   adenomyosis, prolapse--ovaries remain  . TRANSVAGINAL TAPE (TVT) REMOVAL N/A 02/26/2016   Procedure: Lysis of mid urethral sling, Anterior Colporrhaphy, Cystoscopy ;  Surgeon: Nunzio Cobbs, MD;  Location: [facility];  Service: Gynecology;  Laterality: N/A;  first case/ move first case to follow this. Need 1 hour/   . TUBAL LIGATION      Current Outpatient Medications  Medication Sig Dispense Refill  . acetaminophen  (TYLENOL) 500 MG tablet Take 1,000 mg by mouth daily as needed for mild pain. Take 1000 mg daily    . acetaZOLAMIDE (DIAMOX) 250 MG tablet 1 tablet    . ALPRAZolam (XANAX) 0.5 MG tablet Take 0.25-0.5 mg by mouth 2 (two) times daily as needed for anxiety.     . brexpiprazole (REXULTI) 2 MG TABS tablet 1 tablet    . Cholecalciferol (VITAMIN D-3) 5000 UNITS TABS Take 1 tablet by mouth daily.    Marland Kitchen desvenlafaxine (PRISTIQ) 100 MG 24 hr tablet Take 100 mg by mouth daily.    Marland Kitchen estradiol (ESTRACE) 0.1 MG/GM vaginal cream Use 1/2 g vaginally every night for the first 2 weeks, then use 1/2 g vaginally two or three times per week as needed to maintain symptom relief. 42.5 g 1  . fenofibrate 160 MG tablet Take 160 mg by mouth daily.    Marland Kitchen ibuprofen (ADVIL,MOTRIN) 600 MG tablet Take 1 tablet (600 mg total) by mouth every 6 (six) hours as needed (mild pain). 30 tablet 0  . losartan (COZAAR) 50 MG tablet Take 1 tablet by mouth daily.    . magnesium oxide (MAG-OX) 400 MG tablet Take 400 mg by mouth daily.    . metoprolol succinate (TOPROL-XL) 100 MG 24 hr tablet Take 1 tablet by mouth daily.    Marland Kitchen omeprazole (PRILOSEC) 20 MG capsule Take 20 mg by mouth daily.    Marland Kitchen triamcinolone (NASACORT) 55 MCG/ACT AERO nasal inhaler Place 2 sprays into both nostrils daily.     No current facility-administered medications for this visit.     ALLERGIES: Aripiprazole, Hydrocodone, Other, and Percocet [oxycodone-acetaminophen]  Family History  Problem Relation Age of Onset  . Heart failure Mother        congestive  . Diabetes Mother   . Hypertension Mother   . Heart disease Father        MI  . Diabetes Father   . Heart attack Father   . Cancer Brother 23       lung cancer  . Cancer Brother        metastasized, unknown origin  . Colon cancer Paternal Grandmother   . Diabetes Brother   . Breast cancer Cousin   . Breast cancer Cousin     Social History   Socioeconomic History  . Marital status: Single    Spouse  name: Not on file  . Number of children: 2  . Years of education: Not on file  . Highest education level: Not on file  Occupational History  Comment: Intake coordinator  Tobacco Use  . Smoking status: Current Every Day Smoker    Packs/day: 0.25    Years: 48.00    Pack years: 12.00    Types: Cigarettes  . Smokeless tobacco: Never Used  . Tobacco comment: feb 202 at 1/2 to 3/4 pack cigs per day  Vaping Use  . Vaping Use: Never used  Substance and Sexual Activity  . Alcohol use: No    Alcohol/week: 0.0 standard drinks  . Drug use: No  . Sexual activity: Not Currently    Partners: Female    Birth control/protection: Surgical    Comment: Hyst  Other Topics Concern  . Not on file  Social History Narrative   Right handed    Caffeine use: 3 cups per day   Lives with partner: Gerald Stabs.   Social Determinants of Health   Financial Resource Strain: Not on file  Food Insecurity: Not on file  Transportation Needs: Not on file  Physical Activity: Not on file  Stress: Not on file  Social Connections: Not on file  Intimate Partner Violence: Not on file    Review of Systems  All other systems reviewed and are negative.   PHYSICAL EXAMINATION:    BP (!) 142/76   Pulse 64   Ht 5\' 2"  (1.575 m)   Wt 158 lb (71.7 kg)   LMP 03/07/2004 (Approximate)   SpO2 93%   BMI 28.90 kg/m     General appearance: alert, cooperative and appears stated age   Pelvic US  Uterus absent.  Normal ovaries.  No adnexal masses.  No free fluid.   ASSESSMENT Status post A and P repair with TVT and cysto.  Status post lysis of midurethral sling. Mild GSI.  Vaginal pain/pelvic pain.  First degree cystocele.  PLAN  Reassurance regarding normal pelvic US findings.  Images and report reviewed.  Return for pessary fitting.  I encouraged patient to continue to be active.  Consider gabapentin oral or compounded cream.  I also discussed pelvic floor therapy as a possibility if pain is persistent.     21 min  total time was spent for this patient encounter, including preparation, face-to-face counseling with the patient, coordination of care, and documentation of the encounter.
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
## 2020-10-30 ENCOUNTER — Ambulatory Visit (INDEPENDENT_AMBULATORY_CARE_PROVIDER_SITE_OTHER): Payer: Medicare Other | Admitting: Obstetrics and Gynecology

## 2020-10-30 ENCOUNTER — Encounter: Payer: Self-pay | Admitting: Obstetrics and Gynecology

## 2020-10-30 ENCOUNTER — Other Ambulatory Visit: Payer: Self-pay

## 2020-10-30 VITALS — BP 122/70 | HR 60 | Ht 62.0 in | Wt 158.0 lb

## 2020-10-30 DIAGNOSIS — R3989 Other symptoms and signs involving the genitourinary system: Secondary | ICD-10-CM | POA: Diagnosis not present

## 2020-10-30 DIAGNOSIS — N8110 Cystocele, unspecified: Secondary | ICD-10-CM

## 2020-11-07 DIAGNOSIS — G4733 Obstructive sleep apnea (adult) (pediatric): Secondary | ICD-10-CM | POA: Diagnosis not present

## 2021-01-10 DIAGNOSIS — M1711 Unilateral primary osteoarthritis, right knee: Secondary | ICD-10-CM | POA: Diagnosis not present

## 2021-01-10 DIAGNOSIS — M25561 Pain in right knee: Secondary | ICD-10-CM | POA: Diagnosis not present

## 2021-02-14 DIAGNOSIS — G4733 Obstructive sleep apnea (adult) (pediatric): Secondary | ICD-10-CM | POA: Diagnosis not present

## 2021-02-15 DIAGNOSIS — I10 Essential (primary) hypertension: Secondary | ICD-10-CM | POA: Diagnosis not present

## 2021-02-15 DIAGNOSIS — R079 Chest pain, unspecified: Secondary | ICD-10-CM | POA: Diagnosis not present

## 2021-02-15 DIAGNOSIS — F17200 Nicotine dependence, unspecified, uncomplicated: Secondary | ICD-10-CM | POA: Diagnosis not present

## 2021-02-15 DIAGNOSIS — N289 Disorder of kidney and ureter, unspecified: Secondary | ICD-10-CM | POA: Diagnosis not present

## 2021-02-15 DIAGNOSIS — E782 Mixed hyperlipidemia: Secondary | ICD-10-CM | POA: Diagnosis not present

## 2021-02-15 DIAGNOSIS — R7303 Prediabetes: Secondary | ICD-10-CM | POA: Diagnosis not present

## 2021-02-15 DIAGNOSIS — E559 Vitamin D deficiency, unspecified: Secondary | ICD-10-CM | POA: Diagnosis not present

## 2021-02-22 ENCOUNTER — Other Ambulatory Visit: Payer: Self-pay | Admitting: Obstetrics and Gynecology

## 2021-02-22 DIAGNOSIS — Z1231 Encounter for screening mammogram for malignant neoplasm of breast: Secondary | ICD-10-CM

## 2021-03-25 ENCOUNTER — Other Ambulatory Visit: Payer: Self-pay

## 2021-03-25 ENCOUNTER — Ambulatory Visit
Admission: RE | Admit: 2021-03-25 | Discharge: 2021-03-25 | Disposition: A | Discharge status: Home / Self Care | Payer: Medicare Other | Source: Ambulatory Visit | Attending: Obstetrics and Gynecology | Admitting: Obstetrics and Gynecology

## 2021-03-25 DIAGNOSIS — Z1231 Encounter for screening mammogram for malignant neoplasm of breast: Secondary | ICD-10-CM | POA: Diagnosis not present

## 2021-04-03 ENCOUNTER — Encounter: Payer: Self-pay | Admitting: Cardiology

## 2021-04-22 ENCOUNTER — Ambulatory Visit: Payer: Medicare Other | Admitting: Cardiology

## 2021-04-24 ENCOUNTER — Other Ambulatory Visit: Payer: Self-pay

## 2021-04-24 ENCOUNTER — Encounter: Payer: Self-pay | Admitting: Cardiology

## 2021-04-24 ENCOUNTER — Ambulatory Visit: Payer: Medicare Other | Admitting: Cardiology

## 2021-04-24 VITALS — BP 150/69 | HR 56 | Ht 62.5 in | Wt 159.2 lb

## 2021-04-24 DIAGNOSIS — I341 Nonrheumatic mitral (valve) prolapse: Secondary | ICD-10-CM

## 2021-04-24 DIAGNOSIS — I2584 Coronary atherosclerosis due to calcified coronary lesion: Secondary | ICD-10-CM

## 2021-04-24 DIAGNOSIS — Z720 Tobacco use: Secondary | ICD-10-CM

## 2021-04-24 DIAGNOSIS — I10 Essential (primary) hypertension: Secondary | ICD-10-CM | POA: Diagnosis not present

## 2021-04-24 DIAGNOSIS — R079 Chest pain, unspecified: Secondary | ICD-10-CM | POA: Diagnosis not present

## 2021-04-24 DIAGNOSIS — I2510 Atherosclerotic heart disease of native coronary artery without angina pectoris: Secondary | ICD-10-CM | POA: Diagnosis not present

## 2021-04-24 DIAGNOSIS — R0789 Other chest pain: Secondary | ICD-10-CM | POA: Diagnosis not present

## 2021-04-24 DIAGNOSIS — E782 Mixed hyperlipidemia: Secondary | ICD-10-CM

## 2021-04-24 MED ORDER — METOPROLOL TARTRATE 50 MG PO TABS: 50.0000 mg | ORAL_TABLET | Freq: Once | ORAL | 0 refills | Status: DC

## 2021-04-24 NOTE — Progress Notes (Signed)
Primary Care Provider: Harlan Stains, MD [facility] Cardiologist: None Electrophysiologist: None Rheumatologist: Dr. Kathlene November Endocrine-Dr. Owens Shark  Clinic Note: Chief Complaint  Patient presents with   New Patient (Initial Visit)   Chest Pain     ===================================  ASSESSMENT/PLAN   Problem List Items Addressed This Visit       Cardiology Problems   Coronary artery calcification of native artery    Previously there was current evidence of coronary calcification on CT scan, although not commented on during recent chest CT evaluation.  She has been having episodes of chest discomfort 3-4 times a week about 2 years ago, that, got better when she started on the metoprolol and get her blood pressure controlled.  She has backed off on her exercise because of knee pain, and is concerned now about potentially needing surgery and also would like to get back into doing more exercise of different types.  Is interested in ischemic evaluation.  Not currently having chest pain and actually being on exercise now without a much difficulty however she has had a couple episodes that are quite concerning.   In order to get a good anatomic and physiologic evaluation, the best evaluation would be with Coronary CT angiogram.  Plan: Coronary CT Angiogram with possible FFRCT      Relevant Orders   EKG 12-Lead (Completed)   ECHOCARDIOGRAM COMPLETE   CT CORONARY MORPH W/CTA COR W/SCORE W/CA W/CM &/OR WO/CM   Basic metabolic panel (Completed)   Mitral valve prolapse - Primary (Chronic)    This is long ago diagnosis.  I do not know that it has been confirmed or denied.  I did not hear anything on exam, but exam was somewhat limited due to body habitus.  In order to confirm the presence of mitral valve prolapse and or any other systolic or diastolic dysfunction issues, will check 2D echocardiogram.      Relevant Orders   EKG 12-Lead (Completed)   ECHOCARDIOGRAM COMPLETE   CT  CORONARY MORPH W/CTA COR W/SCORE W/CA W/CM &/OR WO/CM   Basic metabolic panel (Completed)   HTN (hypertension) (Chronic)    Blood pressure is high today, but she is pretty anxious.  Was very well controlled PCPs office.  For now continue losartan and metoprolol succinate.      Relevant Orders   EKG 12-Lead (Completed)   ECHOCARDIOGRAM COMPLETE   CT CORONARY MORPH W/CTA COR W/SCORE W/CA W/CM &/OR WO/CM   Basic metabolic panel (Completed)   Hyperlipidemia (Chronic)    Lipids are pretty poorly controlled with an LDL of 137 cholesterol of 217 back in February.  She is a smoker with hypertension hyperlipidemia and a family history albeit not of premature CAD, family history nonetheless.  Would probably try to target LDL least less than 100, but we would need to baseline cardiovascular risk assessment and ischemic evaluation determine how aggressive we need to be.  At a minimum, would consider Coronary Calcium Score, but with Her Having Chest Pain Would Proceed with Coronary CT Angiogram.      Relevant Orders   EKG 12-Lead (Completed)   ECHOCARDIOGRAM COMPLETE   CT CORONARY MORPH W/CTA COR W/SCORE W/CA W/CM &/OR WO/CM   Basic metabolic panel (Completed)     Other   Atypical chest pain    She is having some weird episodic episodes of chest discomfort not associate with exertion now.  In the past however she had exertional chest pain that was concerning for angina.  This would suggest that  she potentially did have ischemic CAD at the time.  Plan: For full anatomic evaluation we will do a coronary CTA which gives [place] both coronary calcium score and evidence of any potentially obstructive or nonobstructive disease.  CT FFR will provide physiologic data if indicated.      Relevant Orders   EKG 12-Lead (Completed)   ECHOCARDIOGRAM COMPLETE   CT CORONARY MORPH W/CTA COR W/SCORE W/CA W/CM &/OR WO/CM   Basic metabolic panel (Completed)   Tobacco abuse (Chronic)    Working on cessation.  Once  she is able to get off the cigarettes, then she needs to get off the vaping.      Relevant Orders   EKG 12-Lead (Completed)   ECHOCARDIOGRAM COMPLETE   CT CORONARY MORPH W/CTA COR W/SCORE W/CA W/CM &/OR WO/CM   Basic metabolic panel (Completed)   Other Visit Diagnoses     Chest pain, unspecified type       Relevant Orders   EKG 12-Lead (Completed)   ECHOCARDIOGRAM COMPLETE   CT CORONARY MORPH W/CTA COR W/SCORE W/CA W/CM &/OR WO/CM   Basic metabolic panel (Completed)      ===================================  HPI:    [name] is a 65 y.o. female smoker with a history of HTN, HLD, COPD, pre-DM 2 who is being seen today for the evaluation of ATYPICAL CHEST PAIN at the request of Harlan Stains, MD.  Marcell Anger was seen on 02/15/2021 by Dr. Dema Severin for routine follow-up.  She was not monitoring her pressures at home but was doing routine exercise 3-5 times a week walking as well as working out the gym.  No comment about chest pain at the time.  No dyspnea.  No myalgias with statin. -> She mentioned that she had an episode while walking outside 2 years ago where she had pain in her upper chest that radiated to her neck and jaw not associated with dyspnea or diaphoresis.  The episode lasted 15 minutes.  She had an episode 3 years prior to that sitting on the couch. -> Says a family history with a father having at least 3 MIs and a CVA in mom and CHF.  There is report that she had had a cath done 15 years ago, but no report available.  Recent chest CT (reviewed below) did not comment on coronary calcification/atherosclerosis but did comment on aortic atherosclerosis. => Blood pressure was ranged from 125/68-1 32/70 with heart rate in the 50s. Referred to cardiology because of 2 episodes of chest pain in the past 5 years.  Not having chest pain now.  Recent Hospitalizations: None  Reviewed  CV studies:    The following studies were reviewed today: (if available,  images/films reviewed: From Epic Chart or Care Everywhere) Chest CT 09/24/2020: Lung RADS 2.  Aortic atherosclerosis.-No aneurysm., emphysema.  Normal caliber pulmonary arteries. Per report, had heart catheterization 15 years ago   Interval History:   JOANI COSMA presents here today a little perplexed.  She seems to be doing the best she can to keep her self healthy with exception of still smoking.  She is definitely cutting down with the help of vaping.  She is down to 7 cigarettes a day along with vaping.  She has had to limit her exercise recently because of knee pain.  Previously she was walking at 3 miles on the treadmill along with 30 minutes on the machines at the [facility] gym-now she is down to 1-1/2 miles.  She does admit that  when the knees are hurting her it does make her little more short of breath, she has not noted any resting exertional dyspnea or chest pain.  She reviewed the episodes of chest pain that happened roughly 2 or 3 years ago in the otherwise 1/2 years ago. About 4 and half years ago-sitting on sofa with her partner and granddaughter watching a television show.  She started noticing jaw and chest pain about 8 out of 10 in nature lasted about 10 minutes.  Not associate with an 10 mm since y other symptoms of dyspnea and fatigue or diaphoresis.  It cleared up spontaneously and that she did have another episode again for a couple years. 2 to 3 years ago she had an episode while walking to the shed to get some equipment for a long period she made she had and felt pressure in her chest radiating to her neck and jaw last about 10 minutes.  She felt somewhat tired and fatigued while this happened.  She then had about 3-4 spells in the next week.  Pain only lasted a few minutes.  After that week was over, no further symptoms.  Otherwise, she notes intermittent palpitations but nothing prolonged.  Nothing longer than a minute or 2.  She does have some weird unusual chest discomfort  spells now couple times a week.  Not really associated with exertion.  CV Review of Symptoms (Summary) current symptoms Cardiovascular ROS: positive for - irregular heartbeat, palpitations, and now exercise intolerance due to knee pain negative for - edema, loss of consciousness, orthopnea, paroxysmal nocturnal dyspnea, rapid heart rate, shortness of breath, or syncope/near syncope, TIA/amaurosis fugax, claudication No current exertional chest pain or dyspnea, but has had episodes of chest pain in the past they were concerning.  REVIEWED OF SYSTEMS   Review of Systems  Constitutional:  Negative for malaise/fatigue and weight loss.  Respiratory:  Negative for shortness of breath.   Cardiovascular:  Negative for chest pain (Not in the last 2 years) and leg swelling.  Gastrointestinal:  Negative for blood in stool and melena.  Genitourinary:  Negative for hematuria.  Musculoskeletal:  Positive for joint pain (Bilateral knee pain).  Neurological:  Positive for dizziness (Balance issues.). Negative for seizures, weakness and headaches.  Psychiatric/Behavioral:  Negative for depression and memory loss. The patient is not nervous/anxious.    I have reviewed and (if needed) personally updated the patient's problem list, medications, allergies, past medical and surgical history, social and family history.   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  Minimum saturation 79%-Dr. Elenore Rota   Osteoarthritis    generalized   PONV (postoperative nausea and vomiting)    Prediabetes    Tobacco dependence    Vision abnormalities     PAST SURGICAL HISTORY   Past Surgical History:  Procedure  Laterality Date   ANTERIOR AND POSTERIOR REPAIR N/A 10/16/2015   Procedure: ANTERIOR (CYSTOCELE) AND POSTERIOR REPAIR (RECTOCELE);  Surgeon: Nunzio Cobbs, MD;  Location: [facility];  Service: Gynecology;  Laterality: N/A;   APPENDECTOMY  1974   BLADDER SUSPENSION N/A 10/16/2015   Procedure: TRANSVAGINAL TAPE (TVT) PROCEDURE exact midurethral sling;  Surgeon: Nunzio Cobbs, MD;  Location: [facility];  Service: Gynecology;  Laterality: N/A;   BREAST BIOPSY     negative x 2    BREAST EXCISIONAL BIOPSY Left 1998   BREAST EXCISIONAL BIOPSY Right 1986   BUNIONECTOMY     w/hammer toe repair   CARPAL TUNNEL RELEASE Right    CERVICAL [place] SURGERY  6/06   CYSTO N/A 10/16/2015   Procedure: Kathrene Alu;  Surgeon: Nunzio Cobbs, MD;  Location: [facility];  Service: Gynecology;  Laterality: N/A;   CYSTOSCOPY N/A 02/26/2016   Procedure: CYSTOSCOPY;  Surgeon: Nunzio Cobbs, MD;  Location: [facility];  Service: Gynecology;  Laterality: N/A;   KNEE ARTHROSCOPY     right knee x 2   MENISCUS REPAIR     right knee x 1   TENS     upper and lower back   TONSILECTOMY, ADENOIDECTOMY, BILATERAL MYRINGOTOMY AND TUBES     TOTAL VAGINAL HYSTERECTOMY  05/2004   adenomyosis, prolapse--ovaries remain   TRANSVAGINAL TAPE (TVT) REMOVAL N/A 02/26/2016   Procedure: Lysis of mid urethral sling, Anterior Colporrhaphy, Cystoscopy ;  Surgeon: Nunzio Cobbs, MD;  Location: [facility];  Service: Gynecology;  Laterality: N/A;  first case/ move first case to follow this. Need 1 hour/    TUBAL LIGATION      Immunization History  Administered Date(s) Administered   Influenza Inj  Mdck Quad Pf 03/18/2018   Influenza,inj,Quad PF,6+ Mos 04/30/2018   Influenza-Unspecified 04/29/2017   Moderna Sars-Covid-2 Vaccination 08/18/2019, 09/15/2019, 05/01/2020   Pneumococcal Polysaccharide-23 09/06/2014   Tdap 11/04/2008, 09/13/2018    MEDICATIONS/ALLERGIES   Current Meds  Medication Sig   acetaminophen (TYLENOL) 500 MG tablet Take 1,000 mg by mouth daily as needed for mild pain. Take 1000 mg daily   acetaZOLAMIDE (DIAMOX) 250 MG tablet 1 tablet   ALPRAZolam (XANAX) 0.5 MG tablet Take 0.25-0.5 mg by mouth 2 (two) times daily as needed for anxiety.    brexpiprazole (REXULTI) 2 MG TABS tablet 1 tablet   Cholecalciferol (VITAMIN D-3) 5000 UNITS TABS Take 1 tablet by mouth daily.   desvenlafaxine (PRISTIQ) 100 MG 24 hr tablet Take 100 mg by mouth daily.   fenofibrate 160 MG tablet Take 160 mg by mouth daily.   ibuprofen (ADVIL,MOTRIN) 600 MG tablet Take 1 tablet (600 mg total) by mouth every 6 (six) hours as needed (mild pain).   losartan (COZAAR) 50 MG tablet Take 1 tablet by mouth daily.   magnesium oxide (MAG-OX) 400 MG tablet Take 400 mg by mouth daily.   metoprolol succinate (TOPROL-XL) 100 MG 24 hr tablet Take 1 tablet by mouth daily.   metoprolol tartrate (LOPRESSOR) 50 MG tablet Take 1 tablet (50 mg total) by mouth once for 1 dose. TAKE TWO HOURS PRIOR TO  SCHEDULE CARDIAC TEST   omeprazole (PRILOSEC) 20 MG capsule Take 20 mg by mouth daily.   triamcinolone (NASACORT) 55 MCG/ACT AERO nasal inhaler Place 2 sprays into both nostrils daily.   [DISCONTINUED] estradiol (ESTRACE) 0.1 MG/GM vaginal cream Use 1/2 g vaginally every night for the first 2 weeks, then use  1/2 g vaginally two or three times per week as needed to maintain symptom relief.    Allergies  Allergen Reactions   Aripiprazole Other (See Comments)    Felt hyper   Fluoxetine     Ineffective   Hydrocodone Other (See Comments)    Headache   Other Other (See Comments)   Percocet [Oxycodone-Acetaminophen]  Other (See Comments)    Causes headaches    SOCIAL HISTORY/FAMILY HISTORY   Reviewed in Epic:   Social History   Tobacco Use   Smoking status: Every Day    Packs/day: 0.25    Years: 48.00    Pack years: 12.00    Types: Cigarettes   Smokeless tobacco: Never   Tobacco comments:    feb 202 at 1/2 to 3/4 pack cigs per day  Vaping Use   Vaping Use: Some days   Start date: 02/05/2020  Substance Use Topics   Alcohol use: No    Alcohol/week: 0.0 standard drinks   Drug use: No   Social History   Social History Narrative   Right handed    Caffeine use: 3 cups per day   Lives with partner: Johny Drilling.   --> 2 kids [place]; 2 grandchildren   Prescription, but does not go to church.      Has cut down to 7 cigarettes a day from 10-12.  He is using vaping to assist.      Exercise: Tries to go 3 days a week to the [facility].  She does about mile and a half on the treadmill and up 30-minute on machines.  She used to do it more frequently and longer on the treadmill at least 3 miles, but this is been reduced her knee pain.   Family History  Problem Relation Age of Onset   Heart failure Mother        congestive-presumably nonischemic cardiomyopathy   Diabetes Mellitus II Mother    Hypertension Mother    Coronary artery disease Father 63       MI   Diabetes Father    Heart attack Father 55       x2   Stroke Father 46   Hypertension Father    Hyperlipidemia Father    Diabetes Mellitus II Father    Cancer Brother 36       lung cancer   Cancer Brother        metastasized, unknown origin   Diabetes Brother    Colon cancer Paternal Grandmother    Breast cancer Cousin    Breast cancer Cousin     OBJCTIVE -PE, EKG, labs   Wt Readings from Last 3 Encounters:  04/24/21 159 lb 3.2 oz (72.2 kg)  10/30/20 158 lb (71.7 kg)  10/16/20 158 lb (71.7 kg)    Physical Exam: BP (!) 150/69   Pulse (!) 56   Ht 5' 2.5" (1.588 m)   Wt 159 lb 3.2 oz (72.2 kg)    LMP 03/07/2004 (Approximate)   SpO2 100%   BMI 28.65 kg/m  Physical Exam Vitals reviewed.  Constitutional:      General: She is not in acute distress.    Appearance: Normal appearance. She is normal weight. She is not ill-appearing or toxic-appearing.  HENT:     Head: Normocephalic and atraumatic.  Neck:     Vascular: No carotid bruit or JVD.  Cardiovascular:     Rate and Rhythm: Normal rate and regular rhythm. No extrasystoles are present.  Chest Wall: PMI is not displaced.     Pulses: Normal pulses.     Heart sounds: Normal heart sounds, S1 normal and S2 normal. No midsystolic click. No murmur heard.   No friction rub. No gallop.  Pulmonary:     Effort: Pulmonary effort is normal. No respiratory distress.     Breath sounds: Normal breath sounds. No rhonchi.  Abdominal:     General: Abdomen is flat. Bowel sounds are normal. There is no distension.     Palpations: Abdomen is soft. There is no mass.     Comments: No HSM or bruit  Musculoskeletal:        General: No swelling. Normal range of motion.     Cervical back: Normal range of motion and neck supple.  Skin:    General: Skin is warm and dry.  Neurological:     General: No focal deficit present.     Mental Status: She is alert and oriented to person, place, and time.  Psychiatric:        Mood and Affect: Mood normal.        Behavior: Behavior normal.        Thought Content: Thought content normal.        Judgment: Judgment normal.     Adult ECG Report  Rate: 56 ;  Rhythm: sinus bradycardia and cannot exclude septal MI, age undetermined.  Otherwise normal axis, intervals and durations. ;   Narrative Interpretation: Borderline EKG.  Recent Labs:   08/27/2020: TC 217, TG 172, LDL 137, HDL 46.  TSH 3.08. 02/15/2021: A1c 5.9 No results found for: CHOL, HDL, LDLCALC, LDLDIRECT, TRIG, CHOLHDL Lab Results  Component Value Date   CREATININE 0.92 04/24/2021   BUN 20 04/24/2021   NA 140 04/24/2021   K 5.1 04/24/2021    CL 104 04/24/2021   CO2 22 04/24/2021   CBC Latest Ref Rng & Units 07/25/2020 03/06/2017 03/07/2016  WBC 4.0 - 10.5 K/uL 8.7 8.1 7.8  Hemoglobin 12.0 - 15.0 g/dL 13.6 12.6 12.3  Hematocrit 36.0 - 46.0 % 42.0 39.9 37.8  Platelets 150 - 400 K/uL 430(H) 404(H) 414(H)    No results found for: HGBA1C No results found for: TSH  ==================================================  COVID-19 Education: The signs and symptoms of COVID-19 were discussed with the patient and how to seek care for testing (follow up with PCP or arrange E-visit).    I spent a total of 33 minutes with the patient spent in direct patient consultation.  Additional time spent with chart review  / charting (studies, outside notes, etc): 28 min Total Time: 61 min  Current medicines are reviewed at length with the patient today.  (+/- concerns) n/a  This visit occurred during the SARS-CoV-2 public health emergency.  Safety protocols were in place, including screening questions prior to the visit, additional usage of staff PPE, and extensive cleaning of exam room while observing appropriate contact time as indicated for disinfecting solutions.  Notice: This dictation was prepared with Dragon dictation along with smart phrase technology. Any transcriptional errors that result from this process are unintentional and may not be corrected upon review.   Studies Ordered:  Orders Placed This Encounter  Procedures   CT CORONARY MORPH W/CTA COR W/SCORE W/CA W/CM &/OR WO/CM   Basic metabolic panel   EKG 42-[facility]   ECHOCARDIOGRAM COMPLETE    Patient Instructions / Medication Changes & Studies & Tests Ordered   Patient Instructions  Medication Instructions:  No changes  See instruction  below-one time dose of Metoprolol  *If you need a refill on your cardiac medications before your next appointment, please call your pharmacy*   Lab Work:  BMP  If you have labs (blood work) drawn today and your tests are completely normal,  you will receive your results only by: [place] (if you have MyChart) OR A paper copy in the mail If you have any lab test that is abnormal or we need to change your treatment, we will call you to review the results.   Testing/Procedures:   Will be schedule at [place] has requested that you have an echocardiogram. Echocardiography is a painless test that uses sound waves to create images of your heart. It provides your doctor with information about the size and shape of your heart and how well your heart's chambers and valves are working. This procedure takes approximately one hour. There are no restrictions for this procedure.   Once authorization is obtbained - will be schedule at [facility] Radiology dept  [place] has requested that you have cardiac CTA. Cardiac computed tomography (CT) is a painless test that uses an x-ray machine to take clear, detailed pictures of your heart.Please follow instruction sheet as given.    Follow-Up: At [facility], you and your health needs are our priority.  As part of our continuing mission to provide you with exceptional heart care, we have created designated Provider Care Teams.  These Care Teams include your primary Cardiologist (physician) and Advanced Practice Providers (APPs -  Physician Assistants and Nurse Practitioners) who all work together to provide you with the care you need, when you need it.     Your next appointment:   2  to 3 month(s)  The format for your next appointment:   In Person  Provider:   Glenetta Hew, MD   Other Instructions      Your cardiac CT will be scheduled at the below locations:   [facility] [address] [place], [place] [address] [phone]  Please arrive at the [facility] main entrance (entrance A) of [facility] 30 minutes prior to test start time. You can use the FREE valet parking offered at  the main entrance (encouraged to control the heart rate for the test) Proceed to the [facility] Radiology Department (first floor) to check-in and test prep.    Please follow these instructions carefully (unless otherwise directed):  Please have labs done at least one week prior to test  On the Night Before the Test: Be sure to Drink plenty of water. Do not consume any caffeinated/decaffeinated beverages or chocolate 12 hours prior to your test. Do not take any antihistamines 12 hours prior to your test.   On the Day of the Test: Drink plenty of water until 1 hour prior to the test. Do not eat any food 4 hours prior to the test. You may take your regular medications prior to the test.  Take  regular dose of metoprolol prior to test.  ( May possible may need an additional Metoprolol tartrate 50 mg  2 hours prior to  test)  FEMALES- please wear underwire-free bra if available, avoid dresses & tight clothing       After the Test: Drink plenty of water. After receiving IV contrast, you may experience a mild flushed feeling. This is normal. On occasion, you may experience a mild rash up to 24 hours after the  test. This is not dangerous. If this occurs, you can take Benadryl 25 mg and increase your fluid intake. If you experience trouble breathing, this can be serious. If it is severe call 911 IMMEDIATELY. If it is mild, please call our office.   Please allow 2-4 weeks for scheduling of routine cardiac CTs. Some insurance companies require a pre-authorization which may delay scheduling of this test.   For non-scheduling related questions, please contact the cardiac imaging nurse navigator should you have any questions/concerns: Marchia Bond, Cardiac Imaging Nurse Navigator Gordy Clement, Cardiac Imaging Nurse Navigator [facility] Direct Office Dial: [phone]   For scheduling needs, including cancellations and rescheduling, please call [place],  [phone].    Glenetta Hew, M.D., M.S. Interventional Cardiologist   Pager # [phone] Phone # [phone] [address] [place], [place] [address]   Thank you for choosing Heartcare at [facility]!!

## 2021-04-24 NOTE — Patient Instructions (Addendum)
Medication Instructions:  No changes  See instruction below-one time dose of Metoprolol  *If you need a refill on your cardiac medications before your next appointment, please call your pharmacy*   Lab Work:  BMP  If you have labs (blood work) drawn today and your tests are completely normal, you will receive your results only by: [place] (if you have MyChart) OR A paper copy in the mail If you have any lab test that is abnormal or we need to change your treatment, we will call you to review the results.   Testing/Procedures:   Will be schedule at [place] has requested that you have an echocardiogram. Echocardiography is a painless test that uses sound waves to create images of your heart. It provides your doctor with information about the size and shape of your heart and how well your heart's chambers and valves are working. This procedure takes approximately one hour. There are no restrictions for this procedure.   Once authorization is obtbained - will be schedule at [facility] Radiology dept  [place] has requested that you have cardiac CTA. Cardiac computed tomography (CT) is a painless test that uses an x-ray machine to take clear, detailed pictures of your heart.Please follow instruction sheet as given.    Follow-Up: At [facility], you and your health needs are our priority.  As part of our continuing mission to provide you with exceptional heart care, we have created designated Provider Care Teams.  These Care Teams include your primary Cardiologist (physician) and Advanced Practice Providers (APPs -  Physician Assistants and Nurse Practitioners) who all work together to provide you with the care you need, when you need it.     Your next appointment:   2  to 3 month(s)  The format for your next appointment:   In Person  Provider:   Glenetta Hew, MD   Other Instructions      Your cardiac  CT will be scheduled at the below locations:   [facility] [address] [place],  [address] [phone]  Please arrive at the [facility] main entrance (entrance A) of [facility] 30 minutes prior to test start time. You can use the FREE valet parking offered at the main entrance (encouraged to control the heart rate for the test) Proceed to the [facility] Radiology Department (first floor) to check-in and test prep.    Please follow these instructions carefully (unless otherwise directed):  Please have labs done at least one week prior to test  On the Night Before the Test: Be sure to Drink plenty of water. Do not consume any caffeinated/decaffeinated beverages or chocolate 12 hours prior to your test. Do not take any antihistamines 12 hours prior to your test.   On the Day of the Test: Drink plenty of water until 1 hour prior to the test. Do not eat any food 4 hours prior to the test. You may take your regular medications prior to the test.  Take  regular dose of metoprolol prior to test.  ( May possible may need an additional Metoprolol tartrate 50 mg  2 hours prior to  test)  FEMALES- please wear underwire-free bra if available, avoid dresses & tight clothing       After the Test: Drink plenty of water. After receiving IV contrast, you may experience a mild flushed feeling. This is normal. On occasion, you may experience a mild  rash up to 24 hours after the test. This is not dangerous. If this occurs, you can take Benadryl 25 mg and increase your fluid intake. If you experience trouble breathing, this can be serious. If it is severe call [phone] IMMEDIATELY. If it is mild, please call our office.   Please allow 2-4 weeks for scheduling of routine cardiac CTs. Some insurance companies require a pre-authorization which may delay scheduling of this test.   For non-scheduling related questions, please contact the cardiac imaging nurse navigator  should you have any questions/concerns: Marchia Bond, Cardiac Imaging Nurse Navigator Gordy Clement, Cardiac Imaging Nurse Navigator [facility] Direct Office Dial: [phone]   For scheduling needs, including cancellations and rescheduling, please call [place], [phone].

## 2021-04-25 LAB — BASIC METABOLIC PANEL
BUN/Creatinine Ratio: 22 (ref 12–28)
BUN: 20 mg/dL (ref 8–27)
CO2: 22 mmol/L (ref 20–29)
Calcium: 10.1 mg/dL (ref 8.7–10.3)
Chloride: 104 mmol/L (ref 96–106)
Creatinine, Ser: 0.92 mg/dL (ref 0.57–1.00)
Glucose: 85 mg/dL (ref 70–99)
Potassium: 5.1 mmol/L (ref 3.5–5.2)
Sodium: 140 mmol/L (ref 134–144)
eGFR: 69 mL/min/{1.73_m2} (ref >59)

## 2021-04-28 ENCOUNTER — Encounter: Payer: Self-pay | Admitting: Cardiology

## 2021-04-28 NOTE — Assessment & Plan Note (Signed)
She is having some weird episodic episodes of chest discomfort not associate with exertion now.  In the past however she had exertional chest pain that was concerning for angina.  This would suggest that she potentially did have ischemic CAD at the time.  Plan: For full anatomic evaluation we will do a coronary CTA which gives [place] both coronary calcium score and evidence of any potentially obstructive or nonobstructive disease.  CT FFR will provide physiologic data if indicated.

## 2021-04-28 NOTE — Assessment & Plan Note (Signed)
Previously there was current evidence of coronary calcification on CT scan, although not commented on during recent chest CT evaluation.  She has been having episodes of chest discomfort 3-4 times a week about 2 years ago, that, got better when she started on the metoprolol and get her blood pressure controlled.  She has backed off on her exercise because of knee pain, and is concerned now about potentially needing surgery and also would like to get back into doing more exercise of different types.  Is interested in ischemic evaluation.  Not currently having chest pain and actually being on exercise now without a much difficulty however she has had a couple episodes that are quite concerning.   In order to get a good anatomic and physiologic evaluation, the best evaluation would be with Coronary CT angiogram.  Plan: Coronary CT Angiogram with possible FFRCT

## 2021-04-28 NOTE — Assessment & Plan Note (Signed)
Blood pressure is high today, but she is pretty anxious.  Was very well controlled PCPs office.  For now continue losartan and metoprolol succinate.

## 2021-04-28 NOTE — Assessment & Plan Note (Signed)
This is long ago diagnosis.  I do not know that it has been confirmed or denied.  I did not hear anything on exam, but exam was somewhat limited due to body habitus.  In order to confirm the presence of mitral valve prolapse and or any other systolic or diastolic dysfunction issues, will check 2D echocardiogram.

## 2021-04-28 NOTE — Assessment & Plan Note (Addendum)
Lipids are pretty poorly controlled with an LDL of 137 cholesterol of 217 back in February.  She is a smoker with hypertension hyperlipidemia and a family history albeit not of premature CAD, family history nonetheless.  Would probably try to target LDL least less than 100, but we would need to baseline cardiovascular risk assessment and ischemic evaluation determine how aggressive we need to be.  At a minimum, would consider Coronary Calcium Score, but with Her Having Chest Pain Would Proceed with Coronary CT Angiogram.

## 2021-04-28 NOTE — Assessment & Plan Note (Signed)
Working on cessation.  Once she is able to get off the cigarettes, then she needs to get off the vaping.

## 2021-05-02 ENCOUNTER — Telehealth (HOSPITAL_COMMUNITY): Payer: Self-pay | Admitting: *Deleted

## 2021-05-02 NOTE — Telephone Encounter (Signed)
Reaching out to patient to offer assistance regarding upcoming cardiac imaging study; pt verbalizes understanding of appt date/time, parking situation and where to check in, pre-test NPO status and verified current allergies; name and call back number provided for further questions should they arise  Gordy Clement RN Navigator Cardiac Imaging [facility] [phone] office [phone] cell  Patient will take her daily dose of metoprolol succinate two hours prior to cardiac CT scan.

## 2021-05-03 ENCOUNTER — Ambulatory Visit (HOSPITAL_COMMUNITY)
Admission: RE | Admit: 2021-05-03 | Discharge: 2021-05-03 | Disposition: A | Discharge status: Home / Self Care | Payer: Medicare Other | Source: Ambulatory Visit | Attending: Cardiology | Admitting: Cardiology

## 2021-05-03 ENCOUNTER — Other Ambulatory Visit: Payer: Self-pay

## 2021-05-03 DIAGNOSIS — Z720 Tobacco use: Secondary | ICD-10-CM | POA: Insufficient documentation

## 2021-05-03 DIAGNOSIS — R079 Chest pain, unspecified: Secondary | ICD-10-CM | POA: Diagnosis not present

## 2021-05-03 DIAGNOSIS — R0789 Other chest pain: Secondary | ICD-10-CM | POA: Insufficient documentation

## 2021-05-03 DIAGNOSIS — I2584 Coronary atherosclerosis due to calcified coronary lesion: Secondary | ICD-10-CM | POA: Diagnosis not present

## 2021-05-03 DIAGNOSIS — I341 Nonrheumatic mitral (valve) prolapse: Secondary | ICD-10-CM | POA: Insufficient documentation

## 2021-05-03 DIAGNOSIS — E782 Mixed hyperlipidemia: Secondary | ICD-10-CM | POA: Insufficient documentation

## 2021-05-03 DIAGNOSIS — I2510 Atherosclerotic heart disease of native coronary artery without angina pectoris: Secondary | ICD-10-CM | POA: Diagnosis not present

## 2021-05-03 DIAGNOSIS — I10 Essential (primary) hypertension: Secondary | ICD-10-CM | POA: Insufficient documentation

## 2021-05-03 MED ORDER — NITROGLYCERIN 0.4 MG SL SUBL: 0.8000 mg | SUBLINGUAL_TABLET | Freq: Once | SUBLINGUAL | Status: AC

## 2021-05-03 MED ORDER — NITROGLYCERIN 0.4 MG SL SUBL
SUBLINGUAL_TABLET | SUBLINGUAL | Status: AC
  Administered 2021-05-03: 0.8 mg via SUBLINGUAL
  Filled 2021-05-03: qty 2

## 2021-05-03 MED ORDER — IOHEXOL 350 MG/ML SOLN
100.0000 mL | Freq: Once | INTRAVENOUS | Status: AC | PRN
  Administered 2021-05-03: 100 mL via INTRAVENOUS

## 2021-05-06 DIAGNOSIS — G4733 Obstructive sleep apnea (adult) (pediatric): Secondary | ICD-10-CM | POA: Diagnosis not present

## 2021-05-16 ENCOUNTER — Other Ambulatory Visit: Payer: Self-pay

## 2021-05-16 ENCOUNTER — Ambulatory Visit (HOSPITAL_COMMUNITY): Payer: Medicare Other | Attending: Cardiology

## 2021-05-16 DIAGNOSIS — I2584 Coronary atherosclerosis due to calcified coronary lesion: Secondary | ICD-10-CM | POA: Diagnosis not present

## 2021-05-16 DIAGNOSIS — I341 Nonrheumatic mitral (valve) prolapse: Secondary | ICD-10-CM | POA: Diagnosis not present

## 2021-05-16 DIAGNOSIS — E782 Mixed hyperlipidemia: Secondary | ICD-10-CM | POA: Diagnosis not present

## 2021-05-16 DIAGNOSIS — Z720 Tobacco use: Secondary | ICD-10-CM | POA: Diagnosis not present

## 2021-05-16 DIAGNOSIS — I2510 Atherosclerotic heart disease of native coronary artery without angina pectoris: Secondary | ICD-10-CM

## 2021-05-16 DIAGNOSIS — R0789 Other chest pain: Secondary | ICD-10-CM | POA: Diagnosis not present

## 2021-05-16 DIAGNOSIS — I10 Essential (primary) hypertension: Secondary | ICD-10-CM

## 2021-05-16 DIAGNOSIS — R079 Chest pain, unspecified: Secondary | ICD-10-CM | POA: Insufficient documentation

## 2021-05-16 LAB — ECHOCARDIOGRAM COMPLETE
Area-P 1/2: 3.6 cm2
S' Lateral: 3.5 cm

## 2021-06-04 DIAGNOSIS — G4733 Obstructive sleep apnea (adult) (pediatric): Secondary | ICD-10-CM | POA: Diagnosis not present

## 2021-07-07 DIAGNOSIS — M5126 Other intervertebral disc displacement, lumbar region: Secondary | ICD-10-CM

## 2021-07-07 HISTORY — DX: Other intervertebral disc displacement, lumbar region: M51.26

## 2021-07-24 ENCOUNTER — Inpatient Hospital Stay: Payer: Medicare Other | Attending: Hematology and Oncology

## 2021-07-24 ENCOUNTER — Other Ambulatory Visit: Payer: Self-pay

## 2021-07-24 DIAGNOSIS — D472 Monoclonal gammopathy: Secondary | ICD-10-CM | POA: Insufficient documentation

## 2021-07-24 LAB — CBC WITH DIFFERENTIAL/PLATELET
Abs Immature Granulocytes: 0.02 10*3/uL (ref 0.00–0.07)
Basophils Absolute: 0.1 10*3/uL (ref 0.0–0.1)
Basophils Relative: 1 %
Eosinophils Absolute: 0.2 10*3/uL (ref 0.0–0.5)
Eosinophils Relative: 3 %
HCT: 39.7 % (ref 36.0–46.0)
Hemoglobin: 12.6 g/dL (ref 12.0–15.0)
Immature Granulocytes: 0 %
Lymphocytes Relative: 44 %
Lymphs Abs: 3 10*3/uL (ref 0.7–4.0)
MCH: 29.9 pg (ref 26.0–34.0)
MCHC: 31.7 g/dL (ref 30.0–36.0)
MCV: 94.3 fL (ref 80.0–100.0)
Monocytes Absolute: 0.4 10*3/uL (ref 0.1–1.0)
Monocytes Relative: 6 %
Neutro Abs: 3.2 10*3/uL (ref 1.7–7.7)
Neutrophils Relative %: 46 %
Platelets: 339 10*3/uL (ref 150–400)
RBC: 4.21 MIL/uL (ref 3.87–5.11)
RDW: 12.5 % (ref 11.5–15.5)
WBC: 6.9 10*3/uL (ref 4.0–10.5)
nRBC: 0 % (ref 0.0–0.2)

## 2021-07-24 LAB — COMPREHENSIVE METABOLIC PANEL
ALT: 9 U/L (ref 0–44)
AST: 13 U/L — ABNORMAL LOW (ref 15–41)
Albumin: 4.4 g/dL (ref 3.5–5.0)
Alkaline Phosphatase: 62 U/L (ref 38–126)
Anion gap: 7 (ref 5–15)
BUN: 14 mg/dL (ref 8–23)
CO2: 24 mmol/L (ref 22–32)
Calcium: 9.6 mg/dL (ref 8.9–10.3)
Chloride: 109 mmol/L (ref 98–111)
Creatinine, Ser: 1.04 mg/dL — ABNORMAL HIGH (ref 0.44–1.00)
GFR, Estimated: 59 mL/min — ABNORMAL LOW (ref >60)
Glucose, Bld: 100 mg/dL — ABNORMAL HIGH (ref 70–99)
Potassium: 4 mmol/L (ref 3.5–5.1)
Sodium: 140 mmol/L (ref 135–145)
Total Bilirubin: 0.3 mg/dL (ref 0.3–1.2)
Total Protein: 7.1 g/dL (ref 6.5–8.1)

## 2021-07-25 LAB — KAPPA/LAMBDA LIGHT CHAINS
Kappa free light chain: 28.2 mg/L — ABNORMAL HIGH (ref 3.3–19.4)
Kappa, lambda light chain ratio: 2.66 — ABNORMAL HIGH (ref 0.26–1.65)
Lambda free light chains: 10.6 mg/L (ref 5.7–26.3)

## 2021-07-29 LAB — MULTIPLE MYELOMA PANEL, SERUM
Albumin SerPl Elph-Mcnc: 3.8 g/dL (ref 2.9–4.4)
Albumin/Glob SerPl: 1.4 (ref 0.7–1.7)
Alpha 1: 0.2 g/dL (ref 0.0–0.4)
Alpha2 Glob SerPl Elph-Mcnc: 0.7 g/dL (ref 0.4–1.0)
B-Globulin SerPl Elph-Mcnc: 0.9 g/dL (ref 0.7–1.3)
Gamma Glob SerPl Elph-Mcnc: 1 g/dL (ref 0.4–1.8)
Globulin, Total: 2.8 g/dL (ref 2.2–3.9)
IgA: 109 mg/dL (ref 87–352)
IgG (Immunoglobin G), Serum: 812 mg/dL (ref 586–1602)
IgM (Immunoglobulin M), Srm: 144 mg/dL (ref 26–217)
M Protein SerPl Elph-Mcnc: 0.4 g/dL — ABNORMAL HIGH
Total Protein ELP: 6.6 g/dL (ref 6.0–8.5)

## 2021-07-30 ENCOUNTER — Other Ambulatory Visit: Payer: Self-pay

## 2021-07-30 ENCOUNTER — Ambulatory Visit (INDEPENDENT_AMBULATORY_CARE_PROVIDER_SITE_OTHER): Payer: Medicare Other | Admitting: Cardiology

## 2021-07-30 ENCOUNTER — Encounter: Payer: Self-pay | Admitting: Cardiology

## 2021-07-30 VITALS — BP 118/60 | HR 56 | Ht 62.5 in | Wt 153.4 lb

## 2021-07-30 DIAGNOSIS — E782 Mixed hyperlipidemia: Secondary | ICD-10-CM | POA: Diagnosis not present

## 2021-07-30 DIAGNOSIS — I2584 Coronary atherosclerosis due to calcified coronary lesion: Secondary | ICD-10-CM | POA: Diagnosis not present

## 2021-07-30 DIAGNOSIS — I341 Nonrheumatic mitral (valve) prolapse: Secondary | ICD-10-CM | POA: Diagnosis not present

## 2021-07-30 DIAGNOSIS — I2510 Atherosclerotic heart disease of native coronary artery without angina pectoris: Secondary | ICD-10-CM | POA: Diagnosis not present

## 2021-07-30 DIAGNOSIS — Z716 Tobacco abuse counseling: Secondary | ICD-10-CM

## 2021-07-30 DIAGNOSIS — F17209 Nicotine dependence, unspecified, with unspecified nicotine-induced disorders: Secondary | ICD-10-CM | POA: Diagnosis not present

## 2021-07-30 DIAGNOSIS — I10 Essential (primary) hypertension: Secondary | ICD-10-CM

## 2021-07-30 NOTE — Progress Notes (Signed)
Family Primary Care Provider: Harlan Stains, MD [facility] Cardiologist: Glenetta Hew, MD Electrophysiologist: None Rheumatologist: Dr. Kathlene November Endocrine-Dr. Owens Shark  Clinic Note: No chief complaint on file.  ===================================  ASSESSMENT/PLAN   Problem List Items Addressed This Visit       Cardiology Problems   Mitral valve prolapse (Chronic)    I suspect that this diagnosis was given at the time of we will protocols for mitral prolapse.  Current echocardiogram does not suggest any evidence of mitral prolapse or mitral regurgitation/stenosis. I have removed it from her past medical history.       HTN (hypertension) (Chronic)    Blood pressure looks well controlled today.  She is less anxious.  On stable dose of ARB and Toprol.  Would suggest if she were to have some further issues of chest pain would maybe consider changing from ARB to a calcium channel blocker      Hyperlipidemia (Chronic)    LDL is 137 on last check.  She should be due to get labs checked when she sees her PCP back in the near future.  My recommendation will be for LDL less than 100 based on her family history as mentioned.  Coronary Calcium Score is very reassuring. 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  No evidence of CAD plaque noted on Coronary CTA. She remains pretty much asymptomatic with no chest pain.  This would argue against having obstructive disease.    Plan: Would recommend shooting for LDL less than 100 based on her family history.  She is already on a beta-blocker and ARB.        Other   Tobacco use disorder, continuous (Chronic)    Coming close to quitting, but still has to get off cigarettes altogether and then wean off vaping.  I encouraged her to get rid of the cigarettes and look  for Nicorette gum or patches to use as opposed to vaping.  We spent 5 minutes total time spent together talking medical dissection.      Tobacco abuse counseling (Chronic)    Smoking cessation instruction/counseling given:  counseled patient on the dangers of tobacco use, advised patient to stop smoking, and reviewed strategies to maximize success      ===================================  HPI:    [name] is a 66 y.o. female smoker with a history of HTN, HLD, COPD, pre-DM 2, OSA, (reported history of MVP), and family history of CAD-MI and CVA as well as CHF who is being seen today for 63-month follow-up evaluation for baseline cardiovascular risk.  She was originally seen on April 24, 2021 at the request of Harlan Stains, MD.   02/15/2021-> PCP visit with Dr. Dema Severin.  Doing well.  Routine exercise 3 to 5 days a week and working out at [employer].  No chest pain or pressure.  Tolerating statin.  No dyspnea. Mentioned having a couple episodes of chest pain 2 and 3 years prior to this visit. Also noted Family History: Father with MI x3 and CVA, mother with CHF. CT of chest did not suggest coronary calcification, did suggest aortic atherosclerosis Blood pressure was ranged from 125/68-1 32/70 with heart rate in the 50s. Referred to cardiology because of 2 episodes of chest pain in the past 5 years, along with family history  [name] was seen on April 24, 2021: 2D Echo and Coronary CTA ordered. => Stated she was doing well.  Staying healthy with exception of smoking.  Had cut down smoking to 7 cigarettes a day along with vaping.;  Exercise limited by knee pain-previously 3 miles a day on the treadmill and 30 minutes on machine the [employer].  Now only 1-1/2 while walking.  Only gets dyspneic when her knees hurt.  No further episodes of chest pain.  Intermittent palpitations and a weird unusual sensation in the chest on occasion.  Not associate with exertion.. Echo Coronary  CTA ordered  Recent Hospitalizations: None  Reviewed  CV studies:    The following studies were reviewed today: (if available, images/films reviewed: From Epic Chart or Care Everywhere)-PMH/PSH updated TTE 05/16/2021: EF 60 to 65%.  No R WMA.  Normal diastolic numbers.  No MR or MS.  No comment on MVP.  Mild aortic sclerosis with no stenosis.  Normal RAP. Coronary CTA 05/03/2021: Coronary calcium score 0.  No evidence of CAD.  Interval History:   [name] presents here today to follow-up her studies.  She indicates her partner (who is with her here today) is doing a good job keeping her healthy with her exercise.  She is still doing her exercise routine.  No new episodes of chest pain or pressure.  No PND orthopnea.  CV Review of Symptoms (Summary)  Cardiovascular ROS: no chest pain or dyspnea on exertion positive for - now exercise intolerance due to knee pain; rare occasional palpitations negative for - edema, loss of consciousness, orthopnea, paroxysmal nocturnal dyspnea, rapid heart rate, shortness of breath, or syncope/near syncope, TIA/amaurosis fugax, claudication No current exertional chest pain or dyspnea, but has had episodes of chest pain in the past they were concerning.  REVIEWED OF SYSTEMS   Review of Systems  Constitutional:  Negative for malaise/fatigue and weight loss.  HENT:  Negative for congestion and nosebleeds.   Respiratory:  Negative for cough and shortness of breath.   Cardiovascular:  Chest pain: Not in the last 2 years.       Per HPI  Gastrointestinal:  Negative for abdominal pain, blood in stool and melena.  Genitourinary:  Negative for hematuria.  Musculoskeletal:  Positive for joint pain (Bilateral knee pain.  Limits exercise.).  Neurological:  Positive for dizziness (Still has issues with her balance being off.). Negative for seizures, weakness and headaches.  Endo/Heme/Allergies:  Positive for environmental allergies.   Psychiatric/Behavioral:  Negative for depression and memory loss. The patient is not nervous/anxious.    I have reviewed and (if needed) personally updated the patient's problem list, medications, allergies, past medical and surgical history, social and family history.   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  Minimum saturation 79%-Dr. Elenore Rota   Osteoarthritis    generalized  PONV (postoperative nausea and vomiting)    Prediabetes    Tobacco dependence    Vision abnormalities     PAST SURGICAL HISTORY   Past Surgical History:  Procedure Laterality Date   ANTERIOR AND POSTERIOR REPAIR N/A 10/16/2015   Procedure: ANTERIOR (CYSTOCELE) AND POSTERIOR REPAIR (RECTOCELE);  Surgeon: Nunzio Cobbs, MD;  Location: [facility];  Service: Gynecology;  Laterality: N/A;   APPENDECTOMY  1974   BLADDER SUSPENSION N/A 10/16/2015   Procedure: TRANSVAGINAL TAPE (TVT) PROCEDURE exact midurethral sling;  Surgeon: Nunzio Cobbs, MD;  Location: [facility];  Service: Gynecology;  Laterality: N/A;   BREAST BIOPSY     negative x 2    BREAST EXCISIONAL BIOPSY Left 1998   BREAST EXCISIONAL BIOPSY Right 1986   BUNIONECTOMY     w/hammer toe repair   CARPAL TUNNEL RELEASE Right    CERVICAL [place] SURGERY  12/2004   CT CTA CORONARY W/CA SCORE W/CM &/OR WO/CM  04/26/2019   Coronary calcium  score 0.  No evidence of CAD.   CYSTO N/A 10/16/2015   Procedure: Kathrene Alu;  Surgeon: Nunzio Cobbs, MD;  Location: [facility];  Service: Gynecology;  Laterality: N/A;   CYSTOSCOPY N/A 02/26/2016   Procedure: CYSTOSCOPY;  Surgeon: Nunzio Cobbs, MD;  Location: [facility];  Service: Gynecology;  Laterality: N/A;   KNEE ARTHROSCOPY     right knee x 2   MENISCUS REPAIR     right knee x 1   TENS     upper and lower back   TONSILECTOMY, ADENOIDECTOMY, BILATERAL MYRINGOTOMY AND TUBES     TOTAL VAGINAL HYSTERECTOMY  05/2004   adenomyosis, prolapse--ovaries remain   TRANSTHORACIC ECHOCARDIOGRAM  05/17/2019   EF 60 to 65%.  No R WMA.  Normal diastolic numbers.  No MR or MS.  No comment on MVP.  Mild aortic sclerosis with no stenosis.  Normal RAP.   TRANSVAGINAL TAPE (TVT) REMOVAL N/A 02/26/2016   Procedure: Lysis of mid urethral sling, Anterior Colporrhaphy, Cystoscopy ;  Surgeon: Nunzio Cobbs, MD;  Location: [facility];  Service: Gynecology;  Laterality: N/A;  first case/ move first case to follow this. Need 1 hour/    TUBAL LIGATION     Chest CT 09/24/2020: Lung RADS 2.  Aortic atherosclerosis.-No aneurysm., emphysema.  Normal caliber pulmonary arteries. Per report, had heart catheterization 15 years ago   Immunization History  Administered Date(s) Administered   Influenza Inj Mdck Quad Pf 03/18/2018   Influenza,inj,Quad PF,6+ Mos 04/30/2018   Influenza-Unspecified 04/29/2017   Moderna Sars-Covid-2 Vaccination 08/18/2019, 09/15/2019, 05/01/2020   Pneumococcal Polysaccharide-23 09/06/2014   Tdap 11/04/2008, 09/13/2018    MEDICATIONS/ALLERGIES   Current Meds  Medication Sig   acetaminophen (TYLENOL) 500 MG tablet Take 1,000 mg by mouth daily as needed for mild pain. Take 1000 mg daily   ALPRAZolam (XANAX) 0.5 MG tablet Take 0.25-0.5 mg by mouth 2 (two) times daily as needed for anxiety.    brexpiprazole (REXULTI) 2 MG TABS tablet 1 tablet   Cholecalciferol (VITAMIN  D-3) 5000 UNITS TABS Take 1 tablet by mouth daily.   desvenlafaxine (PRISTIQ) 100 MG 24 hr tablet Take 100 mg by mouth daily.   fenofibrate 160 MG tablet Take 160 mg by mouth daily.   ibuprofen (ADVIL,MOTRIN) 600 MG tablet Take 1 tablet (600 mg total) by mouth every 6 (six) hours as needed (mild pain).   losartan (COZAAR) 50 MG tablet Take 1 tablet  by mouth daily.   magnesium oxide (MAG-OX) 400 MG tablet Take 400 mg by mouth daily.   metoprolol succinate (TOPROL-XL) 100 MG 24 hr tablet Take 1 tablet by mouth daily.   omeprazole (PRILOSEC) 20 MG capsule Take 20 mg by mouth daily.   [DISCONTINUED] acetaZOLAMIDE (DIAMOX) 250 MG tablet 1 tablet    Allergies  Allergen Reactions   Aripiprazole Other (See Comments)    Felt hyper   Fluoxetine     Ineffective   Hydrocodone Other (See Comments)    Headache   Other Other (See Comments)   Percocet [Oxycodone-Acetaminophen] Other (See Comments)    Causes headaches    SOCIAL HISTORY/FAMILY HISTORY   Reviewed in Epic:   Social History   Tobacco Use   Smoking status: Every Day    Packs/day: 0.25    Years: 48.00    Pack years: 12.00    Types: Cigarettes   Smokeless tobacco: Never   Tobacco comments:    feb 202 at 1/2 to 3/4 pack cigs per day  Vaping Use   Vaping Use: Some days   Start date: 02/05/2020  Substance Use Topics   Alcohol use: No    Alcohol/week: 0.0 standard drinks   Drug use: No   Social History   Social History Narrative   Right handed    Caffeine use: 3 cups per day   Lives with partner: Johny Drilling.   --> 2 kids [place] Gart[place] and [place]; 2 grandchildren   Prescription, but does not go to church.      Has cut down to 7 cigarettes a day from 10-12.  He is using vaping to assist.      Exercise: Tries to go 3 days a week to the [facility].  She does about mile and a half on the treadmill and up 30-minute on machines.  She used to do it more frequently and longer on the treadmill at least 3 miles, but this is  been reduced her knee pain.   Family History  Problem Relation Age of Onset   Heart failure Mother        congestive-presumably nonischemic cardiomyopathy   Diabetes Mellitus II Mother    Hypertension Mother    Coronary artery disease Father 66       MI   Diabetes Father    Heart attack Father 54       x2   Stroke Father 82   Hypertension Father    Hyperlipidemia Father    Diabetes Mellitus II Father    Cancer Brother 28       lung cancer   Cancer Brother        metastasized, unknown origin   Diabetes Brother    Colon cancer Paternal Grandmother    Breast cancer Cousin    Breast cancer Cousin     OBJCTIVE -PE, EKG, labs   Wt Readings from Last 3 Encounters:  08/16/21 149 lb (67.6 kg)  08/06/21 149 lb 8 oz (67.8 kg)  07/30/21 153 lb 6.4 oz (69.6 kg)    Physical Exam: BP 118/60    Pulse (!) 56    Ht 5' 2.5" (1.588 m)    Wt 153 lb 6.4 oz (69.6 kg)    LMP 03/07/2004 (Approximate)    SpO2 98%    BMI 27.61 kg/m  Physical Exam Vitals reviewed.  Constitutional:      General: She is not in acute distress.    Appearance: Normal appearance. She is normal weight. She  is not ill-appearing or toxic-appearing.  HENT:     Head: Normocephalic and atraumatic.  Neck:     Vascular: No JVD.  Cardiovascular:     Rate and Rhythm: No extrasystoles are present.    Chest Wall: PMI is not displaced.     Heart sounds: S1 normal and S2 normal. No midsystolic click.  Pulmonary:     Effort: Pulmonary effort is normal.  Abdominal:     Comments: No HSM or bruit  Musculoskeletal:        General: No swelling. Normal range of motion.     Cervical back: Normal range of motion and neck supple.  Skin:    General: Skin is warm and dry.  Neurological:     General: No focal deficit present.     Mental Status: She is alert and oriented to person, place, and time.     Gait: Gait normal.  Psychiatric:        Mood and Affect: Mood normal.        Behavior: Behavior normal.        Thought Content:  Thought content normal.        Judgment: Judgment normal.     Adult ECG Report  Rate: 56 ;  Rhythm: sinus bradycardia and cannot exclude septal MI, age undetermined.  Otherwise normal axis, intervals and durations. ;   Narrative Interpretation: Borderline EKG.  Recent Labs:   08/27/2020: TC 217, TG 172, LDL 137, HDL 46.  TSH 3.08. 02/15/2021: A1c 5.9 No results found for: CHOL, HDL, LDLCALC, LDLDIRECT, TRIG, CHOLHDL she is due for for follow-up labs to be checked by PCP soon.   Lab Results  Component Value Date   CREATININE 1.04 (H) 07/24/2021   BUN 14 07/24/2021   NA 140 07/24/2021   K 4.0 07/24/2021   CL 109 07/24/2021   CO2 24 07/24/2021   CBC Latest Ref Rng & Units 07/24/2021 07/25/2020 03/06/2017  WBC 4.0 - 10.5 K/uL 6.9 8.7 8.1  Hemoglobin 12.0 - 15.0 g/dL 12.6 13.6 12.6  Hematocrit 36.0 - 46.0 % 39.7 42.0 39.9  Platelets 150 - 400 K/uL 339 430(H) 404(H)    No results found for: HGBA1C No results found for: TSH  ==================================================  COVID-19 Education: The signs and symptoms of COVID-19 were discussed with the patient and how to seek care for testing (follow up with PCP or arrange E-visit).    I spent a total of 18 minutes with the patient spent in direct patient consultation.  Additional time spent with chart review  / charting (studies, outside notes, etc): 14 min Total Time: 32 min  Current medicines are reviewed at length with the patient today.  (+/- concerns) n/a  This visit occurred during the SARS-CoV-2 public health emergency.  Safety protocols were in place, including screening questions prior to the visit, additional usage of staff PPE, and extensive cleaning of exam room while observing appropriate contact time as indicated for disinfecting solutions.  Notice: This dictation was prepared with Dragon dictation along with smart phrase technology. Any transcriptional errors that result from this process are unintentional and may not  be corrected upon review.   Studies Ordered:  No orders of the defined types were placed in this encounter.   Patient Instructions / Medication Changes & Studies & Tests Ordered   Patient Instructions  Medication Instructions:   NO CHANGES *If you need a refill on your cardiac medications before your next appointment, please call your pharmacy*   Lab Work:  NOT NEEDED If you have labs (blood work) drawn today and your tests are completely normal, you will receive your results only by: [place] (if you have MyChart) OR A paper copy in the mail If you have any lab test that is abnormal or we need to change your treatment, we will call you to review the results.   Testing/Procedures: Not needed   Follow-Up: At [facility], you and your health needs are our priority.  As part of our continuing mission to provide you with exceptional heart care, we have created designated Provider Care Teams.  These Care Teams include your primary Cardiologist (physician) and Advanced Practice Providers (APPs -  Physician Assistants and Nurse Practitioners) who all work together to provide you with the care you need, when you need it.     Your next appointment:   11 month(s) DEC 2023  The format for your next appointment:   In Person  Provider:   Glenetta Hew, MD       Glenetta Hew, M.D., M.S. Interventional Cardiologist   Pager # [phone] Phone # [phone] [address] [place], [place] [address]   Thank you for choosing Heartcare at [facility]!!

## 2021-07-30 NOTE — Patient Instructions (Addendum)
Medication Instructions:   NO CHANGES *If you need a refill on your cardiac medications before your next appointment, please call your pharmacy*   Lab Work: NOT NEEDED If you have labs (blood work) drawn today and your tests are completely normal, you will receive your results only by: [place] (if you have MyChart) OR A paper copy in the mail If you have any lab test that is abnormal or we need to change your treatment, we will call you to review the results.   Testing/Procedures: Not needed   Follow-Up: At [facility], you and your health needs are our priority.  As part of our continuing mission to provide you with exceptional heart care, we have created designated Provider Care Teams.  These Care Teams include your primary Cardiologist (physician) and Advanced Practice Providers (APPs -  Physician Assistants and Nurse Practitioners) who all work together to provide you with the care you need, when you need it.     Your next appointment:   11 month(s) DEC 2023  The format for your next appointment:   In Person  Provider:   Glenetta Hew, MD

## 2021-07-31 ENCOUNTER — Ambulatory Visit: Payer: Medicare Other | Admitting: Hematology and Oncology

## 2021-08-01 DIAGNOSIS — M5451 Vertebrogenic low back pain: Secondary | ICD-10-CM | POA: Diagnosis not present

## 2021-08-02 ENCOUNTER — Inpatient Hospital Stay: Payer: Medicare Other | Admitting: Hematology and Oncology

## 2021-08-05 NOTE — Patient Instructions (Addendum)
Below is our plan:  We will continue with the Diamox 250 Mg BID.   Please make sure you are staying well hydrated. I recommend 50-60 ounces daily. Well balanced diet and regular exercise encouraged. Consistent sleep schedule with 6-8 hours recommended.   Please continue follow up with care team as directed.   Follow up with Dr. Felecia Shelling in 1 year or sooner if later.   You may receive a survey regarding today's visit. I encourage you to leave honest feed back as I do use this information to improve patient care. Thank you for seeing me today!

## 2021-08-05 NOTE — Progress Notes (Signed)
Chief Complaint  Patient presents with   Follow-up    Rm 10, alone . Here for yearly f/u. Pt reports doing well.     HISTORY OF PRESENT ILLNESS:  08/06/21 ALL:  [name] is a 66 y.o. 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 She was advised to continue Diamox 250mg  BID at last visit with Dr Felecia Shelling 07/2020. She reports doing really well and has had no episodes. She continues to exercise at the [facility] 3x/week with no reports of weakness. Her recent labs were unremarkable.   CPAP managed with Dr Maxwell Caul at [place]. Reports she is doing well with this.  Depression and anxiety are doing well (sees [facility])   HISTORY (copied from Dr Garth Bigness previous note)  [name] is a 66 y.o. woman wih episodes of weakness.   Update 08/06/2020: She has periodic paralysis.   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   She has no family history of episodic weakness.     Since the last visit, she reports doing well --- she has had 5 minor episodes lasting just 15-30 minutes with mild weakness.      She started Diamox in 2018 and she has not had a single major spell since starting (prior to Diamox, she was having 3-5 spells of weakness a week lasting hours at a time).     She is on Diamox 250 mg po bi   She has IgG kappa MGUS (diagnosed in 2013) and sees Dr. Alvy Bimler.  Her M-spike was 0.3 in 2018. She had a BM biopsy and bone survey at diagnosis.    She denies numbness as would be expected if she had polyneuropathy.  Balance is fine and she does not need to hold on to the wall when eyes closed (I.e shampooing hair).      Bone survey in 2017 showed stable lytic inferior pubic rami and stable subtle skull lucencies.    She sees hematology and is continuing with watchful waiting.     Depression and anxiety are doing well (sees [facility])   In  2017, she was diagnosed with severe obstructive sleep apnea. She was placed on CPAP by Dr. Maxwell Caul [facility]) . Sleepiness improved with every night use.      REVIEW OF SYSTEMS: Out of a complete 14 system review of symptoms, the patient complains only of the following symptoms, numbness of feet and all other reviewed systems are negative.   ALLERGIES: Allergies  Allergen Reactions   Aripiprazole Other (See Comments)    Felt hyper   Fluoxetine     Ineffective   Hydrocodone Other (See Comments)    Headache   Other Other (See Comments)   Percocet [Oxycodone-Acetaminophen] Other (See Comments)    Causes headaches     HOME MEDICATIONS: Outpatient Medications Prior to Visit  Medication Sig Dispense Refill   acetaminophen (TYLENOL) 500 MG tablet Take 1,000 mg by mouth daily as needed for mild pain. Take 1000 mg daily     ALPRAZolam (XANAX) 0.5 MG tablet Take 0.25-0.5 mg by mouth 2 (two) times daily as needed for anxiety.      brexpiprazole (REXULTI) 2 MG TABS tablet 1 tablet     Cholecalciferol (VITAMIN D-3) 5000 UNITS TABS Take 1 tablet by mouth daily.     desvenlafaxine (PRISTIQ) 100 MG 24 hr tablet Take 100 mg by mouth daily.  fenofibrate 160 MG tablet Take 160 mg by mouth daily.     ibuprofen (ADVIL,MOTRIN) 600 MG tablet Take 1 tablet (600 mg total) by mouth every 6 (six) hours as needed (mild pain). 30 tablet 0   losartan (COZAAR) 50 MG tablet Take 1 tablet by mouth daily.     magnesium oxide (MAG-OX) 400 MG tablet Take 400 mg by mouth daily.     metoprolol succinate (TOPROL-XL) 100 MG 24 hr tablet Take 1 tablet by mouth daily.     omeprazole (PRILOSEC) 20 MG capsule Take 20 mg by mouth daily.     acetaZOLAMIDE (DIAMOX) 250 MG tablet 1 tablet     No facility-administered medications prior to visit.     PAST MEDICAL HISTORY: Past Medical History:  Diagnosis Date   Aortic atherosclerosis ([place])    Seen on imaging studies   Cervical disc disease    Complication of anesthesia     prolonged sedation   COPD (chronic obstructive pulmonary disease) with emphysema (HCC)    GERD (gastroesophageal reflux disease)    HTN (hypertension)    Hyperlipidemia    MDD (major depressive disorder), recurrent, in partial remission (HCC)    MGUS (monoclonal gammopathy of unknown significance) 03/15/2015   Migraine headache    Mitral valve prolapse 1982   Monoclonal (M) protein disease, multiple 'M' protein    Obstructive sleep apnea 01/03/2016   ESS 4, AHI 51/hour, RDI 56/hour, no REM.  Minimum saturation 79%-Dr. Elenore Rota   Osteoarthritis    generalized   PONV (postoperative nausea and vomiting)    Prediabetes    Tobacco dependence    Vision abnormalities      PAST SURGICAL HISTORY: Past Surgical History:  Procedure Laterality Date   ANTERIOR AND POSTERIOR REPAIR N/A 10/16/2015   Procedure: ANTERIOR (CYSTOCELE) AND POSTERIOR REPAIR (RECTOCELE);  Surgeon: Nunzio Cobbs, MD;  Location: [facility];  Service: Gynecology;  Laterality: N/A;   APPENDECTOMY  1974   BLADDER SUSPENSION N/A 10/16/2015   Procedure: TRANSVAGINAL TAPE (TVT) PROCEDURE exact midurethral sling;  Surgeon: Nunzio Cobbs, MD;  Location: [facility];  Service: Gynecology;  Laterality: N/A;   BREAST BIOPSY     negative x 2    BREAST EXCISIONAL BIOPSY Left 1998   BREAST EXCISIONAL BIOPSY Right 1986   BUNIONECTOMY     w/hammer toe repair   CARPAL TUNNEL RELEASE Right    CERVICAL [place] SURGERY  6/06   CYSTO N/A 10/16/2015   Procedure: Kathrene Alu;  Surgeon: Nunzio Cobbs, MD;  Location: [facility];  Service: Gynecology;  Laterality: N/A;   CYSTOSCOPY N/A 02/26/2016   Procedure: CYSTOSCOPY;  Surgeon: Nunzio Cobbs, MD;  Location: [facility];  Service: Gynecology;  Laterality: N/A;   KNEE ARTHROSCOPY     right knee x 2   MENISCUS REPAIR     right knee x 1   TENS     upper and lower back   TONSILECTOMY, ADENOIDECTOMY, BILATERAL MYRINGOTOMY AND TUBES     TOTAL VAGINAL HYSTERECTOMY  05/2004    adenomyosis, prolapse--ovaries remain   TRANSVAGINAL TAPE (TVT) REMOVAL N/A 02/26/2016   Procedure: Lysis of mid urethral sling, Anterior Colporrhaphy, Cystoscopy ;  Surgeon: Nunzio Cobbs, MD;  Location: [facility];  Service: Gynecology;  Laterality: N/A;  first case/ move first case to follow this. Need 1 hour/    TUBAL LIGATION       FAMILY HISTORY: Family History  Problem Relation  Age of Onset   Heart failure Mother        congestive-presumably nonischemic cardiomyopathy   Diabetes Mellitus II Mother    Hypertension Mother    Coronary artery disease Father 39       MI   Diabetes Father    Heart attack Father 2       x2   Stroke Father 59   Hypertension Father    Hyperlipidemia Father    Diabetes Mellitus II Father    Cancer Brother 15       lung cancer   Cancer Brother        metastasized, unknown origin   Diabetes Brother    Colon cancer Paternal Grandmother    Breast cancer Cousin    Breast cancer Cousin      SOCIAL HISTORY: Social History   Socioeconomic History   Marital status: Single    Spouse name: Not on file   Number of children: 2   Years of education: Not on file   Highest education level: Not on file  Occupational History    Comment: Intake coordinator   Occupation: Intake coordinator/administration    Comment: Disabled  Tobacco Use   Smoking status: Every Day    Packs/day: 0.25    Years: 48.00    Pack years: 12.00    Types: Cigarettes   Smokeless tobacco: Never   Tobacco comments:    feb 202 at 1/2 to 3/4 pack cigs per day  Vaping Use   Vaping Use: Some days   Start date: 02/05/2020  Substance and Sexual Activity   Alcohol use: No    Alcohol/week: 0.0 standard drinks   Drug use: No   Sexual activity: Not Currently    Partners: Female    Birth control/protection: Surgical    Comment: Hyst  Other Topics Concern   Not on file  Social History Narrative   Right handed    Caffeine use: 3 cups per day   Lives with partner:  [name].   --> 2 kids [name] and [place]; 2 grandchildren   Prescription, but does not go to church.      Has cut down to 7 cigarettes a day from 10-12.  He is using vaping to assist.      Exercise: Tries to go 3 days a week to the [facility].  She does about mile and a half on the treadmill and up 30-minute on machines.  She used to do it more frequently and longer on the treadmill at least 3 miles, but this is been reduced her knee pain.   Social Determinants of Health   Financial Resource Strain: Not on file  Food Insecurity: Not on file  Transportation Needs: Not on file  Physical Activity: Not on file  Stress: Not on file  Social Connections: Not on file  Intimate Partner Violence: Not on file     PHYSICAL EXAM  Vitals:   08/06/21 1255  BP: 134/73  Pulse: (!) 56  Weight: 67.8 kg  Height: 5' 2.5" (1.588 m)   Body mass index is 26.91 kg/m.  Generalized: Well developed, in no acute distress  Cardiology: normal rate and rhythm, no murmur auscultated  Respiratory: clear to auscultation bilaterally    Neurological examination  Mentation: Alert oriented to time, place, history taking. Follows all commands speech and language fluent Cranial nerve II-XII: Pupils were equal round reactive to light. Extraocular movements were full, visual field were full on confrontational test. Facial sensation and  strength were normal. Head turning and shoulder shrug  were normal and symmetric. Motor: The motor testing reveals 5 over 5 strength of all 4 extremities. Good symmetric motor tone is noted throughout.  Sensory: Sensory testing is intact to soft touch on all 4 extremities. No evidence of extinction is noted.  Coordination: Cerebellar testing reveals good finger-nose-finger and heel-to-shin bilaterally.  Gait and station: Gait is normal.   Reflexes: Deep tendon reflexes are symmetric and normal bilaterally.    DIAGNOSTIC DATA (LABS, IMAGING, TESTING) - I reviewed  patient records, labs, notes, testing and imaging myself where available.  Lab Results  Component Value Date   WBC 6.9 07/24/2021   HGB 12.6 07/24/2021   HCT 39.7 07/24/2021   MCV 94.3 07/24/2021   PLT 339 07/24/2021      Component Value Date/Time   NA 140 07/24/2021 1118   NA 140 04/24/2021 1612   NA 140 03/06/2017 1027   K 4.0 07/24/2021 1118   K 4.5 03/06/2017 1027   CL 109 07/24/2021 1118   CL 103 06/11/2012 1059   CO2 24 07/24/2021 1118   CO2 25 03/06/2017 1027   GLUCOSE 100 (H) 07/24/2021 1118   GLUCOSE 111 03/06/2017 1027   GLUCOSE 123 (H) 06/11/2012 1059   BUN 14 07/24/2021 1118   BUN 20 04/24/2021 1612   BUN 17.2 03/06/2017 1027   CREATININE 1.04 (H) 07/24/2021 1118   CREATININE 1.0 03/06/2017 1027   CALCIUM 9.6 07/24/2021 1118   CALCIUM 10.0 03/06/2017 1027   PROT 7.1 07/24/2021 1118   PROT 7.5 03/06/2017 1027   PROT 7.0 03/06/2017 1027   ALBUMIN 4.4 07/24/2021 1118   ALBUMIN 4.0 03/06/2017 1027   AST 13 (L) 07/24/2021 1118   AST 14 03/06/2017 1027   ALT 9 07/24/2021 1118   ALT 12 03/06/2017 1027   ALKPHOS 62 07/24/2021 1118   ALKPHOS 60 03/06/2017 1027   BILITOT 0.3 07/24/2021 1118   BILITOT 0.30 03/06/2017 1027   GFRNONAA 59 (L) 07/24/2021 1118   GFRAA 72 11/21/2016 0951   No results found for: CHOL, HDL, LDLCALC, LDLDIRECT, TRIG, CHOLHDL No results found for: HGBA1C No results found for: VITAMINB12 No results found for: TSH  No flowsheet data found.   No flowsheet data found.   ASSESSMENT AND PLAN  66 y.o. year old female  has a past medical history of Aortic atherosclerosis ([place]), Cervical disc disease, Complication of anesthesia, COPD (chronic obstructive pulmonary disease) with emphysema ([place]), GERD (gastroesophageal reflux disease), HTN (hypertension), Hyperlipidemia, MDD (major depressive disorder), recurrent, in partial remission ([place]), MGUS (monoclonal gammopathy of unknown significance) (03/15/2015), Migraine headache, Mitral valve  prolapse (1982), Monoclonal (M) protein disease, multiple 'M' protein, Obstructive sleep apnea (01/03/2016), Osteoarthritis, PONV (postoperative nausea and vomiting), Prediabetes, Tobacco dependence, and Vision abnormalities. here with    Periodic paralysis  Uva is doing very well. We will continue Diamox 250 Mg BID. Labs reviewed in Epic and unremarkable. She will continue low carb diet and regular exercise. Continue close follow up with care team. Follow up in 1 year with Dr. Felecia Shelling.   No orders of the defined types were placed in this encounter.    Meds ordered this encounter  Medications   acetaZOLAMIDE (DIAMOX) 250 MG tablet    Sig: Take 1 tablet twice daily    Dispense:  180 tablet    Refill:  3   Israel Wunder, MSN, FNP-C 08/06/2021, 1:13 PM  [facility] [address] [place], [place] [address] [phone])  [phone] ° °

## 2021-08-06 ENCOUNTER — Other Ambulatory Visit: Payer: Self-pay

## 2021-08-06 ENCOUNTER — Ambulatory Visit (INDEPENDENT_AMBULATORY_CARE_PROVIDER_SITE_OTHER): Payer: Medicare Other | Admitting: Family Medicine

## 2021-08-06 ENCOUNTER — Encounter: Payer: Self-pay | Admitting: Family Medicine

## 2021-08-06 VITALS — BP 134/73 | HR 56 | Ht 62.5 in | Wt 149.5 lb

## 2021-08-06 DIAGNOSIS — G723 Periodic paralysis: Secondary | ICD-10-CM

## 2021-08-06 MED ORDER — ACETAZOLAMIDE 250 MG PO TABS: ORAL_TABLET | ORAL | 3 refills | Status: DC

## 2021-08-09 DIAGNOSIS — J343 Hypertrophy of nasal turbinates: Secondary | ICD-10-CM | POA: Diagnosis not present

## 2021-08-09 DIAGNOSIS — R0981 Nasal congestion: Secondary | ICD-10-CM | POA: Diagnosis not present

## 2021-08-09 DIAGNOSIS — J3489 Other specified disorders of nose and nasal sinuses: Secondary | ICD-10-CM | POA: Diagnosis not present

## 2021-08-09 DIAGNOSIS — J342 Deviated nasal septum: Secondary | ICD-10-CM | POA: Diagnosis not present

## 2021-08-09 DIAGNOSIS — J309 Allergic rhinitis, unspecified: Secondary | ICD-10-CM | POA: Diagnosis not present

## 2021-08-14 DIAGNOSIS — M47816 Spondylosis without myelopathy or radiculopathy, lumbar region: Secondary | ICD-10-CM | POA: Diagnosis not present

## 2021-08-16 ENCOUNTER — Encounter: Payer: Self-pay | Admitting: Hematology and Oncology

## 2021-08-16 ENCOUNTER — Inpatient Hospital Stay: Payer: Medicare Other | Attending: Hematology and Oncology | Admitting: Hematology and Oncology

## 2021-08-16 ENCOUNTER — Other Ambulatory Visit: Payer: Self-pay

## 2021-08-16 DIAGNOSIS — Z79899 Other long term (current) drug therapy: Secondary | ICD-10-CM | POA: Insufficient documentation

## 2021-08-16 DIAGNOSIS — D472 Monoclonal gammopathy: Secondary | ICD-10-CM | POA: Insufficient documentation

## 2021-08-16 DIAGNOSIS — R7989 Other specified abnormal findings of blood chemistry: Secondary | ICD-10-CM | POA: Diagnosis not present

## 2021-08-16 NOTE — Assessment & Plan Note (Signed)
She has elevated serum creatinine unrelated to MGUS We discussed importance of hydration

## 2021-08-16 NOTE — Assessment & Plan Note (Signed)
So far, her myeloma panel is stable and she has no evidence of endorgan damage The elevated serum light chains are likely related to mild elevated serum creatinine We discussed the natural history of MGUS I will see her once a year

## 2021-08-16 NOTE — Progress Notes (Signed)
[facility] OFFICE PROGRESS NOTE  Patient Care Team: Harlan Stains, MD as PCP - General (Family Medicine) Leonie Man, MD as PCP - Cardiology (Cardiology) Bo Merino, MD as Consulting Physician (Rheumatology) Harlan Stains, MD as Attending Physician (Family Medicine)  ASSESSMENT & PLAN:  MGUS (monoclonal gammopathy of unknown significance) So far, her myeloma panel is stable and she has no evidence of endorgan damage The elevated serum light chains are likely related to mild elevated serum creatinine We discussed the natural history of MGUS I will see her once a year  Elevated serum creatinine She has elevated serum creatinine unrelated to MGUS We discussed importance of hydration  No orders of the defined types were placed in this encounter.   All questions were answered. The patient knows to call the clinic with any problems, questions or concerns. The total time spent in the appointment was 20 minutes encounter with patients including review of chart and various tests results, discussions about plan of care and coordination of care plan   Heath Lark, MD 08/16/2021 1:43 PM  INTERVAL HISTORY: Please see below for problem oriented charting. she returns for surveillance follow-up for IgG kappa MGUS She is doing well She is exercising on a regular basis No recent infection no bone pain  REVIEW OF SYSTEMS:   Constitutional: Denies fevers, chills or abnormal weight loss Eyes: Denies blurriness of vision Ears, nose, mouth, throat, and face: Denies mucositis or sore throat Respiratory: Denies cough, dyspnea or wheezes Cardiovascular: Denies palpitation, chest discomfort or lower extremity swelling Gastrointestinal:  Denies nausea, heartburn or change in bowel habits Skin: Denies abnormal skin rashes Lymphatics: Denies new lymphadenopathy or easy bruising Neurological:Denies numbness, tingling or new weaknesses Behavioral/Psych: Mood is stable, no new  changes  All other systems were reviewed with the patient and are negative.  I have reviewed the past medical history, past surgical history, social history and family history with the patient and they are unchanged from previous note.  ALLERGIES:  is allergic to aripiprazole, fluoxetine, hydrocodone, other, and percocet [oxycodone-acetaminophen].  MEDICATIONS:  Current Outpatient Medications  Medication Sig Dispense Refill   acetaminophen (TYLENOL) 500 MG tablet Take 1,000 mg by mouth daily as needed for mild pain. Take 1000 mg daily     acetaZOLAMIDE (DIAMOX) 250 MG tablet Take 1 tablet twice daily 180 tablet 3   ALPRAZolam (XANAX) 0.5 MG tablet Take 0.25-0.5 mg by mouth 2 (two) times daily as needed for anxiety.      brexpiprazole (REXULTI) 2 MG TABS tablet 1 tablet     Cholecalciferol (VITAMIN D-3) 5000 UNITS TABS Take 1 tablet by mouth daily.     desvenlafaxine (PRISTIQ) 100 MG 24 hr tablet Take 100 mg by mouth daily.     fenofibrate 160 MG tablet Take 160 mg by mouth daily.     ibuprofen (ADVIL,MOTRIN) 600 MG tablet Take 1 tablet (600 mg total) by mouth every 6 (six) hours as needed (mild pain). 30 tablet 0   losartan (COZAAR) 50 MG tablet Take 1 tablet by mouth daily.     magnesium oxide (MAG-OX) 400 MG tablet Take 400 mg by mouth daily.     metoprolol succinate (TOPROL-XL) 100 MG 24 hr tablet Take 1 tablet by mouth daily.     omeprazole (PRILOSEC) 20 MG capsule Take 20 mg by mouth daily.     No current facility-administered medications for this visit.    SUMMARY OF ONCOLOGIC HISTORY: She reports she still has persistent diffuse bone pain in  her shoulders, low back, bilateral hips, bilateral knees. She recently has TENS unit implantation however has not had much improvement of her diffuse bone pain.  She has mild fatigue due to chronic bone pain. She had abnormal M spike detected with no evidence of lytic lesion. She is being observed.  PHYSICAL EXAMINATION: ECOG PERFORMANCE  STATUS: 0 - Asymptomatic  Vitals:   08/16/21 1135  BP: 115/64  Pulse: 60  Resp: 18  Temp: 98 F (36.7 C)  SpO2: 100%   Filed Weights   08/16/21 1135  Weight: 149 lb (67.6 kg)    GENERAL:alert, no distress and comfortable NEURO: alert & oriented x 3 with fluent speech, no focal motor/sensory deficits  LABORATORY DATA:  I have reviewed the data as listed    Component Value Date/Time   NA 140 07/24/2021 1118   NA 140 04/24/2021 1612   NA 140 03/06/2017 1027   K 4.0 07/24/2021 1118   K 4.5 03/06/2017 1027   CL 109 07/24/2021 1118   CL 103 06/11/2012 1059   CO2 24 07/24/2021 1118   CO2 25 03/06/2017 1027   GLUCOSE 100 (H) 07/24/2021 1118   GLUCOSE 111 03/06/2017 1027   GLUCOSE 123 (H) 06/11/2012 1059   BUN 14 07/24/2021 1118   BUN 20 04/24/2021 1612   BUN 17.2 03/06/2017 1027   CREATININE 1.04 (H) 07/24/2021 1118   CREATININE 1.0 03/06/2017 1027   CALCIUM 9.6 07/24/2021 1118   CALCIUM 10.0 03/06/2017 1027   PROT 7.1 07/24/2021 1118   PROT 7.5 03/06/2017 1027   PROT 7.0 03/06/2017 1027   ALBUMIN 4.4 07/24/2021 1118   ALBUMIN 4.0 03/06/2017 1027   AST 13 (L) 07/24/2021 1118   AST 14 03/06/2017 1027   ALT 9 07/24/2021 1118   ALT 12 03/06/2017 1027   ALKPHOS 62 07/24/2021 1118   ALKPHOS 60 03/06/2017 1027   BILITOT 0.3 07/24/2021 1118   BILITOT 0.30 03/06/2017 1027   GFRNONAA 59 (L) 07/24/2021 1118   GFRAA 72 11/21/2016 0951    No results found for: SPEP, UPEP  Lab Results  Component Value Date   WBC 6.9 07/24/2021   NEUTROABS 3.2 07/24/2021   HGB 12.6 07/24/2021   HCT 39.7 07/24/2021   MCV 94.3 07/24/2021   PLT 339 07/24/2021      Chemistry      Component Value Date/Time   NA 140 07/24/2021 1118   NA 140 04/24/2021 1612   NA 140 03/06/2017 1027   K 4.0 07/24/2021 1118   K 4.5 03/06/2017 1027   CL 109 07/24/2021 1118   CL 103 06/11/2012 1059   CO2 24 07/24/2021 1118   CO2 25 03/06/2017 1027   BUN 14 07/24/2021 1118   BUN 20 04/24/2021 1612    BUN 17.2 03/06/2017 1027   CREATININE 1.04 (H) 07/24/2021 1118   CREATININE 1.0 03/06/2017 1027      Component Value Date/Time   CALCIUM 9.6 07/24/2021 1118   CALCIUM 10.0 03/06/2017 1027   ALKPHOS 62 07/24/2021 1118   ALKPHOS 60 03/06/2017 1027   AST 13 (L) 07/24/2021 1118   AST 14 03/06/2017 1027   ALT 9 07/24/2021 1118   ALT 12 03/06/2017 1027   BILITOT 0.3 07/24/2021 1118   BILITOT 0.30 03/06/2017 1027

## 2021-08-18 ENCOUNTER — Encounter: Payer: Self-pay | Admitting: Cardiology

## 2021-08-18 DIAGNOSIS — Z716 Tobacco abuse counseling: Secondary | ICD-10-CM | POA: Insufficient documentation

## 2021-08-18 NOTE — Assessment & Plan Note (Signed)
I suspect that this diagnosis was given at the time of we will protocols for mitral prolapse.  Current echocardiogram does not suggest any evidence of mitral prolapse or mitral regurgitation/stenosis. I have removed it from her past medical history.

## 2021-08-18 NOTE — Assessment & Plan Note (Signed)
Blood pressure looks well controlled today.  She is less anxious.  On stable dose of ARB and Toprol.  Would suggest if she were to have some further issues of chest pain would maybe consider changing from ARB to a calcium channel blocker

## 2021-08-18 NOTE — Assessment & Plan Note (Signed)
Smoking cessation instruction/counseling given:  counseled patient on the dangers of tobacco use, advised patient to stop smoking, and reviewed strategies to maximize success 

## 2021-08-18 NOTE — Assessment & Plan Note (Signed)
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  No evidence of CAD plaque noted on Coronary CTA. She remains pretty much asymptomatic with no chest pain.  This would argue against having obstructive disease.    Plan: Would recommend shooting for LDL less than 100 based on her family history.  She is already on a beta-blocker and ARB.

## 2021-08-18 NOTE — Assessment & Plan Note (Signed)
LDL is 137 on last check.  She should be due to get labs checked when she sees her PCP back in the near future.  My recommendation will be for LDL less than 100 based on her family history as mentioned.  Coronary Calcium Score is very reassuring. -We will consider low-dose rosuvastatin

## 2021-08-18 NOTE — Assessment & Plan Note (Addendum)
Coming close to quitting, but still has to get off cigarettes altogether and then wean off vaping.  I encouraged her to get rid of the cigarettes and look for Nicorette gum or patches to use as opposed to vaping.  We spent 5 minutes total time spent together talking medical dissection.

## 2021-08-19 DIAGNOSIS — M5451 Vertebrogenic low back pain: Secondary | ICD-10-CM | POA: Diagnosis not present

## 2021-08-29 DIAGNOSIS — M5416 Radiculopathy, lumbar region: Secondary | ICD-10-CM | POA: Diagnosis not present

## 2021-08-29 DIAGNOSIS — G4733 Obstructive sleep apnea (adult) (pediatric): Secondary | ICD-10-CM | POA: Diagnosis not present

## 2021-08-29 DIAGNOSIS — Z0000 Encounter for general adult medical examination without abnormal findings: Secondary | ICD-10-CM | POA: Diagnosis not present

## 2021-08-29 DIAGNOSIS — Z79899 Other long term (current) drug therapy: Secondary | ICD-10-CM | POA: Diagnosis not present

## 2021-08-29 DIAGNOSIS — I10 Essential (primary) hypertension: Secondary | ICD-10-CM | POA: Diagnosis not present

## 2021-08-29 DIAGNOSIS — E559 Vitamin D deficiency, unspecified: Secondary | ICD-10-CM | POA: Diagnosis not present

## 2021-08-29 DIAGNOSIS — F17200 Nicotine dependence, unspecified, uncomplicated: Secondary | ICD-10-CM | POA: Diagnosis not present

## 2021-08-29 DIAGNOSIS — K219 Gastro-esophageal reflux disease without esophagitis: Secondary | ICD-10-CM | POA: Diagnosis not present

## 2021-08-29 DIAGNOSIS — J449 Chronic obstructive pulmonary disease, unspecified: Secondary | ICD-10-CM | POA: Diagnosis not present

## 2021-08-29 DIAGNOSIS — E782 Mixed hyperlipidemia: Secondary | ICD-10-CM | POA: Diagnosis not present

## 2021-08-29 DIAGNOSIS — R7303 Prediabetes: Secondary | ICD-10-CM | POA: Diagnosis not present

## 2021-09-02 ENCOUNTER — Other Ambulatory Visit: Payer: Self-pay | Admitting: Family Medicine

## 2021-09-02 DIAGNOSIS — E2839 Other primary ovarian failure: Secondary | ICD-10-CM

## 2021-09-03 DIAGNOSIS — R0981 Nasal congestion: Secondary | ICD-10-CM | POA: Diagnosis not present

## 2021-09-04 DIAGNOSIS — G4733 Obstructive sleep apnea (adult) (pediatric): Secondary | ICD-10-CM | POA: Diagnosis not present

## 2021-09-11 ENCOUNTER — Other Ambulatory Visit: Payer: Self-pay | Admitting: Family Medicine

## 2021-09-11 ENCOUNTER — Other Ambulatory Visit: Payer: Self-pay | Admitting: Obstetrics and Gynecology

## 2021-09-11 DIAGNOSIS — Z1231 Encounter for screening mammogram for malignant neoplasm of breast: Secondary | ICD-10-CM

## 2021-09-12 DIAGNOSIS — M5451 Vertebrogenic low back pain: Secondary | ICD-10-CM | POA: Diagnosis not present

## 2021-09-23 ENCOUNTER — Ambulatory Visit (INDEPENDENT_AMBULATORY_CARE_PROVIDER_SITE_OTHER): Payer: Medicare Other | Admitting: Obstetrics and Gynecology

## 2021-09-23 ENCOUNTER — Other Ambulatory Visit: Payer: Self-pay

## 2021-09-23 ENCOUNTER — Encounter: Payer: Self-pay | Admitting: Obstetrics and Gynecology

## 2021-09-23 VITALS — BP 120/74 | HR 56 | Resp 16 | Ht 63.0 in | Wt 144.0 lb

## 2021-09-23 DIAGNOSIS — Z01419 Encounter for gynecological examination (general) (routine) without abnormal findings: Secondary | ICD-10-CM | POA: Diagnosis not present

## 2021-09-23 NOTE — Patient Instructions (Signed)

## 2021-09-23 NOTE — Progress Notes (Signed)
66 y.o. [id number] [name] here for breast and pelvic exam.   ? ?Pelvic pain is now resolved.  ?[facility] was [facility]  ? ?Has some leakage once and a while, not specific to coughing or sneezing.  ?It is more spontaneous and not often.   ?Very small amount.  ?BMs are ok.  ? ?Getting married to her partner Gerald Stabs of 30 years.  ? ?PCP:  Harlan Stains, MD  ? ?Patient's last menstrual period was 03/07/2004 (approximate).     ?  ?    ?Sexually active: No.  ?The current method of family planning is status post hysterectomy.    ?Exercising: Yes.     Goes to [employer] 3x/week ?Smoker:  yes ? ?Health Maintenance: ?Pap:  04-21-06 Neg ?History of abnormal Pap:  no ?MMG:  03-25-21 Neg/BiRads1 ?Colonoscopy:  2017;next 2027 ?BMD:   09-05-16  Result :Normal. Appt.03-26-22 ?TDaP:  09-13-18 ?Gardasil:   n/a ?HIV: 04-07-17 NR ?Hep C: 04-07-17 Neg ?Screening Labs:   PCP.  ?Pneumonia vaccine:  she will do.  ?Shingrix:  she will start series.  ?Covid:  2 boosters completed.  ? ? reports that she has been smoking cigarettes. She has a 12.00 pack-year smoking history. She has never used smokeless tobacco. She reports that she does not drink alcohol and does not use drugs. ? ?Past Medical History:  ?Diagnosis Date  ? Aortic atherosclerosis ([place])   ? Seen on imaging studies  ? Cervical disc disease   ? Complication of anesthesia   ? prolonged sedation  ? COPD (chronic obstructive pulmonary disease) with emphysema ([place])   ? GERD (gastroesophageal reflux disease)   ? HTN (hypertension)   ? Hyperlipidemia   ? Lumbar herniated disc 07/07/2021  ? MDD (major depressive disorder), recurrent, in partial remission ([place])   ? MGUS (monoclonal gammopathy of unknown significance) 03/15/2015  ? Migraine headache   ? Monoclonal (M) protein disease, multiple 'M' protein   ? Obstructive sleep apnea 01/03/2016  ? ESS 4, AHI 51/hour, RDI 56/hour, no REM.  Minimum saturation 79%-Dr. Elenore Rota  ? Osteoarthritis   ?  generalized  ? PONV (postoperative nausea and vomiting)   ? Prediabetes   ? Tobacco dependence   ? Vision abnormalities   ? ? ?Past Surgical History:  ?Procedure Laterality Date  ? ANTERIOR AND POSTERIOR REPAIR N/A 10/16/2015  ? Procedure: ANTERIOR (CYSTOCELE) AND POSTERIOR REPAIR (RECTOCELE);  Surgeon: Nunzio Cobbs, MD;  Location: [facility];  Service: Gynecology;  Laterality: N/A;  ? APPENDECTOMY  1974  ? BLADDER SUSPENSION N/A 10/16/2015  ? Procedure: TRANSVAGINAL TAPE (TVT) PROCEDURE exact midurethral sling;  Surgeon: Nunzio Cobbs, MD;  Location: [facility];  Service: Gynecology;  Laterality: N/A;  ? BREAST BIOPSY    ? negative x 2   ? BREAST EXCISIONAL BIOPSY Left 1998  ? BREAST EXCISIONAL BIOPSY Right 1986  ? BUNIONECTOMY    ? w/hammer toe repair  ? CARPAL TUNNEL RELEASE Right   ? CERVICAL DISC SURGERY  12/2004  ? CT CTA CORONARY W/CA SCORE W/CM &/OR WO/CM  04/26/2019  ? Coronary calcium score 0.  No evidence of CAD.  ? CYSTO N/A 10/16/2015  ? Procedure: CYSTO;  Surgeon: Nunzio Cobbs, MD;  Location: [facility];  Service: Gynecology;  Laterality: N/A;  ? CYSTOSCOPY N/A 02/26/2016  ? Procedure: CYSTOSCOPY;  Surgeon: Nunzio Cobbs, MD;  Location: [facility];  Service: Gynecology;  Laterality: N/A;  ? KNEE ARTHROSCOPY    ? right knee x 2  ? MENISCUS REPAIR    ? right knee x 1  ? TENS    ? upper and lower back  ? TONSILECTOMY, ADENOIDECTOMY, BILATERAL MYRINGOTOMY AND TUBES    ? TOTAL VAGINAL HYSTERECTOMY  05/2004  ? adenomyosis, prolapse--ovaries remain  ? TRANSTHORACIC ECHOCARDIOGRAM  05/17/2019  ? EF 60 to 65%.  No R WMA.  Normal diastolic numbers.  No MR or MS.  No comment on MVP.  Mild aortic sclerosis with no stenosis.  Normal RAP.  ? TRANSVAGINAL TAPE (TVT) REMOVAL N/A 02/26/2016  ? Procedure: Lysis of mid urethral sling, Anterior Colporrhaphy, Cystoscopy ;  Surgeon: Nunzio Cobbs, MD;  Location: [facility];  Service: Gynecology;  Laterality: N/A;  first case/ move first  case to follow this. Need 1 hour/   ? TUBAL LIGATION    ? ? ?Current Outpatient Medications  ?Medication Sig Dispense Refill  ? acetaminophen (TYLENOL) 500 MG tablet Take 1,000 mg by mouth daily as needed for mild pain. Take 1000 mg daily    ? acetaZOLAMIDE (DIAMOX) 250 MG tablet Take 1 tablet twice daily 180 tablet 3  ? ALPRAZolam (XANAX) 0.5 MG tablet Take 0.25-0.5 mg by mouth 2 (two) times daily as needed for anxiety.     ? atorvastatin (LIPITOR) 20 MG tablet Take 1 tablet by mouth daily.    ? brexpiprazole (REXULTI) 2 MG TABS tablet 1 tablet    ? Cholecalciferol (VITAMIN D-3) 5000 UNITS TABS Take 1 tablet by mouth daily.    ? desvenlafaxine (PRISTIQ) 100 MG 24 hr tablet Take 1 tablet by mouth daily.    ? ibuprofen (ADVIL,MOTRIN) 600 MG tablet Take 1 tablet (600 mg total) by mouth every 6 (six) hours as needed (mild pain). 30 tablet 0  ? losartan (COZAAR) 50 MG tablet Take 1 tablet by mouth daily.    ? magnesium oxide (MAG-OX) 400 MG tablet Take 400 mg by mouth daily.    ? metoprolol succinate (TOPROL-XL) 100 MG 24 hr tablet Take 1 tablet by mouth daily.    ? omega-3 acid ethyl esters (LOVAZA) 1 g capsule Take 2 capsules by mouth 2 (two) times daily.    ? omeprazole (PRILOSEC) 20 MG capsule Take 20 mg by mouth daily.    ? triamcinolone (NASACORT) 55 MCG/ACT AERO nasal inhaler Place into the nose.    ? ?No current facility-administered medications for this visit.  ? ? ?Family History  ?Problem Relation Age of Onset  ? Heart failure Mother   ?     congestive-presumably nonischemic cardiomyopathy  ? Diabetes Mellitus II Mother   ? Hypertension Mother   ? Coronary artery disease Father 85  ?     MI  ? Diabetes Father   ? Heart attack Father 81  ?     x2  ? Stroke Father 71  ? Hypertension Father   ? Hyperlipidemia Father   ? Diabetes Mellitus II Father   ? Cancer Brother 44  ?     lung cancer  ? Cancer Brother   ?     metastasized, unknown origin  ? Diabetes Brother   ? Colon cancer Paternal Grandmother   ? Breast  cancer Cousin   ? Breast cancer Cousin   ? ? ?Review of Systems  ?All other systems reviewed and are negative. ? ?Exam:   ?BP 120/74   Pulse (!) 56   Resp 16  Ht '5\' 3"'$  (1.6 m)   Wt 144 lb (65.3 kg)   LMP 03/07/2004 (Approximate)   BMI 25.51 kg/m?     ?General appearance: alert, cooperative and appears stated age ?Head: normocephalic, without obvious abnormality, atraumatic ?Neck: no adenopathy, supple, symmetrical, trachea midline and thyroid normal to inspection and palpation ?Lungs: clear to auscultation bilaterally ?Breasts: normal appearance, no masses or tenderness, No nipple retraction or dimpling, No nipple discharge or bleeding, No axillary adenopathy ?Heart: regular rate and rhythm ?Abdomen: soft, non-tender; no masses, no organomegaly ?Extremities: extremities normal, atraumatic, no cyanosis or edema ?Skin: skin color, texture, turgor normal. No rashes or lesions ?Lymph nodes: cervical, supraclavicular, and axillary nodes normal. ?Neurologic: grossly normal ? ?Pelvic: External genitalia:  no lesions ?             No abnormal inguinal nodes palpated. ?             Urethra:  normal appearing urethra with no masses, tenderness or lesions ?             Bartholins and Skenes: normal    ?             Vagina: normal appearing vagina with normal color and discharge, no lesions.  Good support.  ?             Cervix: absent ?             Pap taken: no ?Bimanual Exam:  Uterus:  absent ?             Adnexa: no mass, fullness, tenderness ?             Rectal exam: yes.  Confirms. ?             Anus:  normal sphincter tone, no lesions ? ?Chaperone was present for exam:  Wandra Scot, CMA ? ?Assessment:   ?Well woman visit with gynecologic exam. ?Status post TVH - fibroids, adenomyosis.  Ovaries remain.  ?Status post A and P repair with TVT and cysto.  ?Status post lysis of midurethral sling.  Mild GSI.  ?MGUS.  ?FH multiple cancers.  Genetic testing negative.  ?Periodic paralysis.   ? ?Plan: ?Mammogram screening  discussed. ?Self breast awareness reviewed. ?Pap and HR HPV as above. ?Guidelines for Calcium, Vitamin D, regular exercise program including cardiovascular and weight bearing exercise. ?BMD through PCP.  ?Follow u

## 2021-10-08 DIAGNOSIS — J309 Allergic rhinitis, unspecified: Secondary | ICD-10-CM | POA: Diagnosis not present

## 2021-10-08 DIAGNOSIS — J342 Deviated nasal septum: Secondary | ICD-10-CM | POA: Diagnosis not present

## 2021-10-24 DIAGNOSIS — Z23 Encounter for immunization: Secondary | ICD-10-CM | POA: Diagnosis not present

## 2021-10-24 DIAGNOSIS — E782 Mixed hyperlipidemia: Secondary | ICD-10-CM | POA: Diagnosis not present

## 2021-10-24 DIAGNOSIS — Z79899 Other long term (current) drug therapy: Secondary | ICD-10-CM | POA: Diagnosis not present

## 2021-12-04 DIAGNOSIS — G4733 Obstructive sleep apnea (adult) (pediatric): Secondary | ICD-10-CM | POA: Diagnosis not present

## 2021-12-11 DIAGNOSIS — M25561 Pain in right knee: Secondary | ICD-10-CM | POA: Diagnosis not present

## 2021-12-19 DIAGNOSIS — M25561 Pain in right knee: Secondary | ICD-10-CM | POA: Diagnosis not present

## 2021-12-29 DIAGNOSIS — M25561 Pain in right knee: Secondary | ICD-10-CM | POA: Diagnosis not present

## 2022-01-09 DIAGNOSIS — M25561 Pain in right knee: Secondary | ICD-10-CM | POA: Diagnosis not present

## 2022-01-14 IMAGING — MG MM DIGITAL SCREENING BILAT W/ TOMO AND CAD
8 series · 8 of 24 positions shown · non-contrast
Comparison: Previous exam(s).

CLINICAL DATA: Screening.

EXAM:
DIGITAL SCREENING BILATERAL MAMMOGRAM WITH TOMOSYNTHESIS AND CAD
TECHNIQUE: Bilateral screening digital craniocaudal and mediolateral oblique
mammograms were obtained. Bilateral screening digital breast
tomosynthesis was performed. The images were evaluated with
computer-aided detection.

[R CC synth-2D]
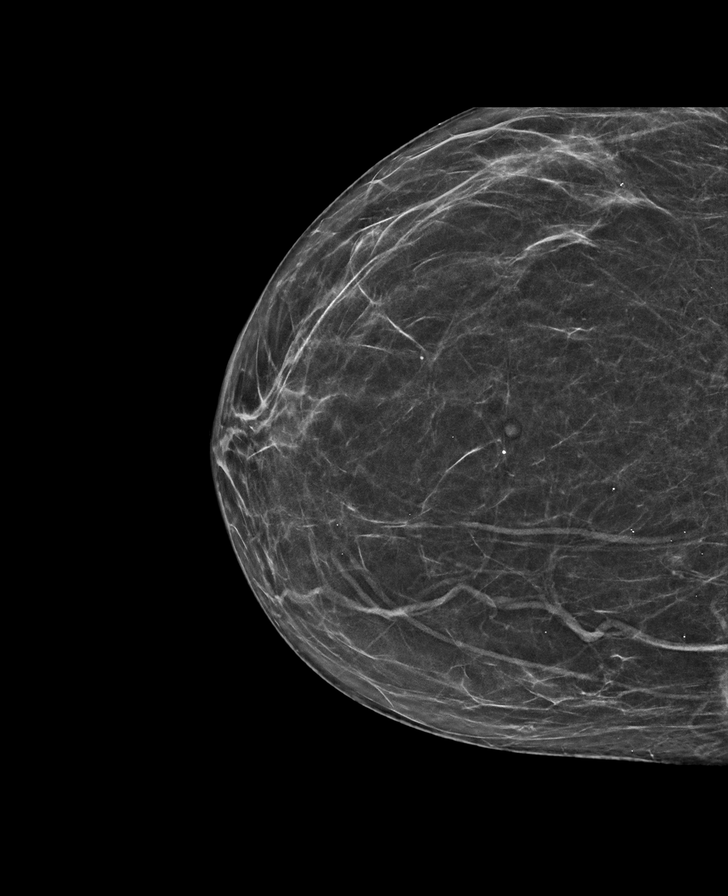

[L MLO synth-2D]
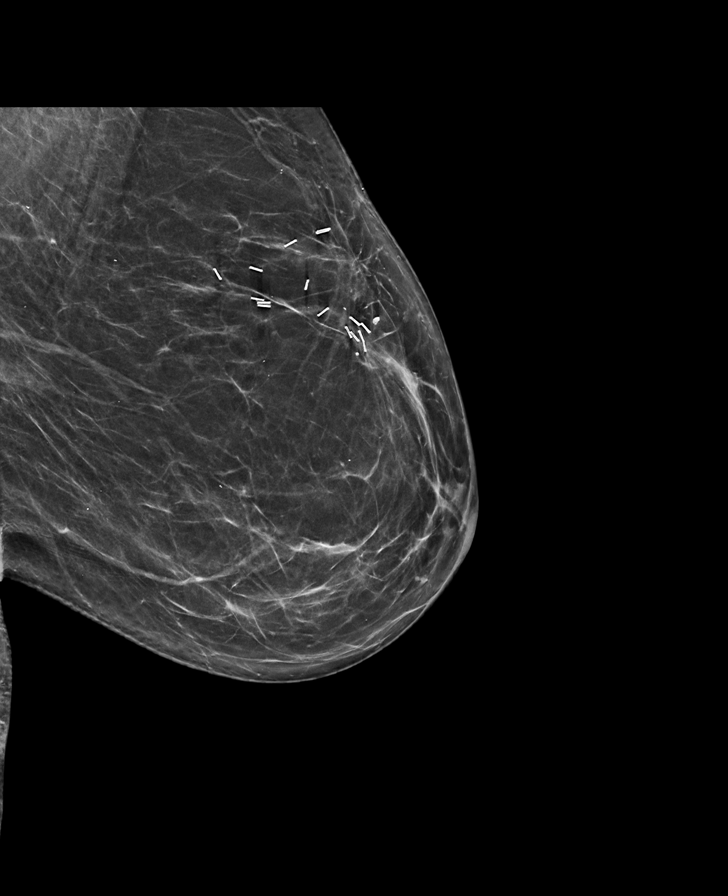

[L CC synth-2D]
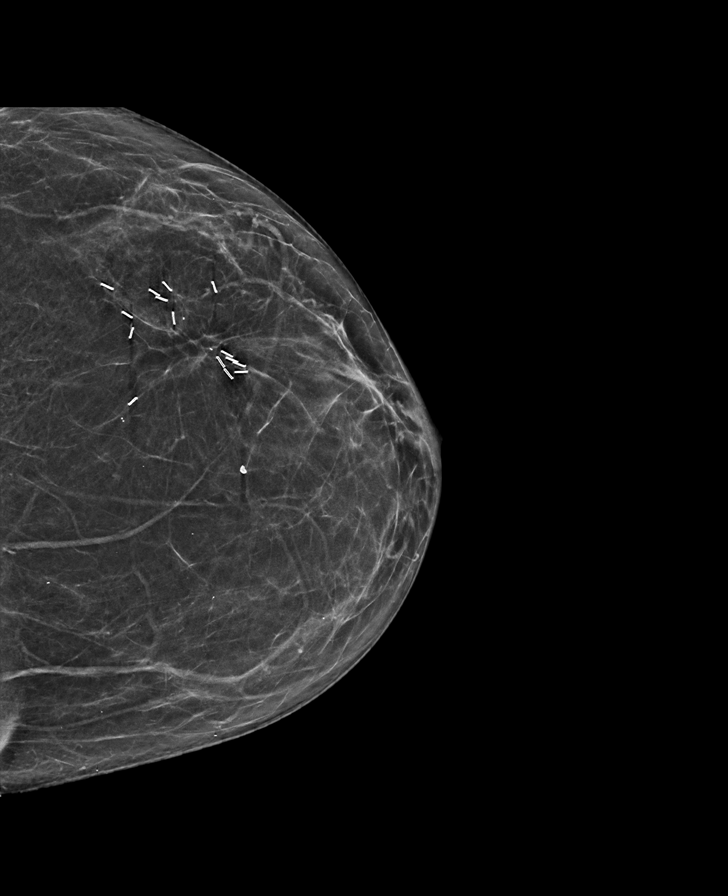

[R MLO synth-2D]
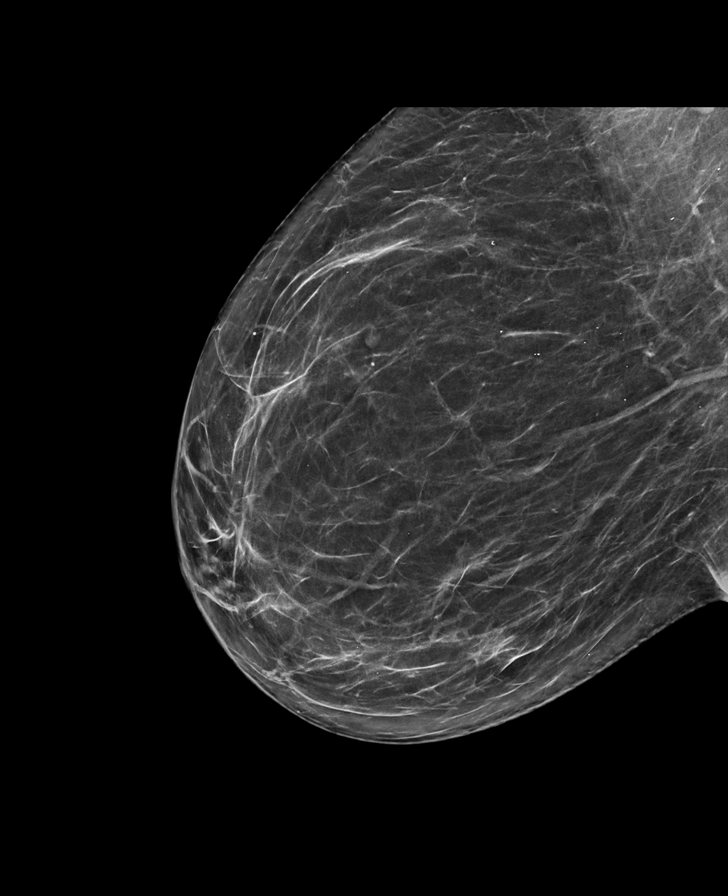

[L CC tomo · tomo slice 32/63.0]
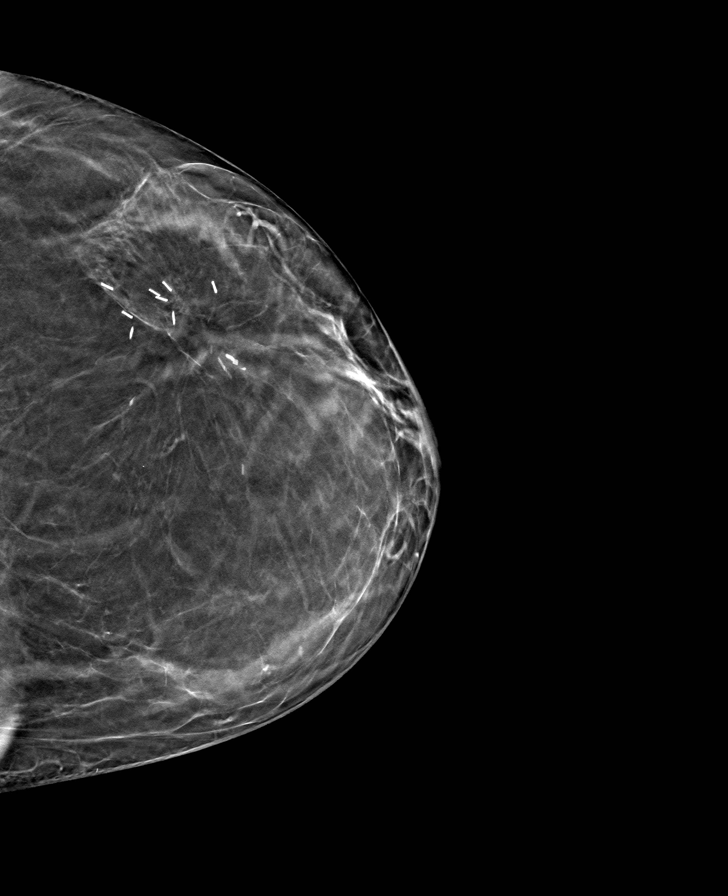

[R CC tomo · tomo slice 32/63.0]
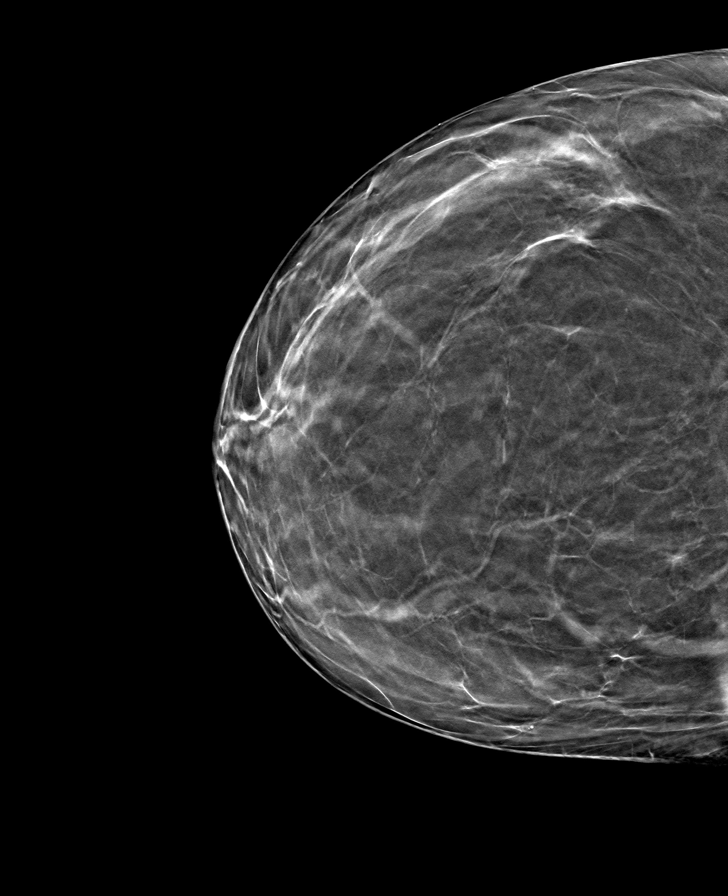

[L MLO tomo · tomo slice 37/72.0]
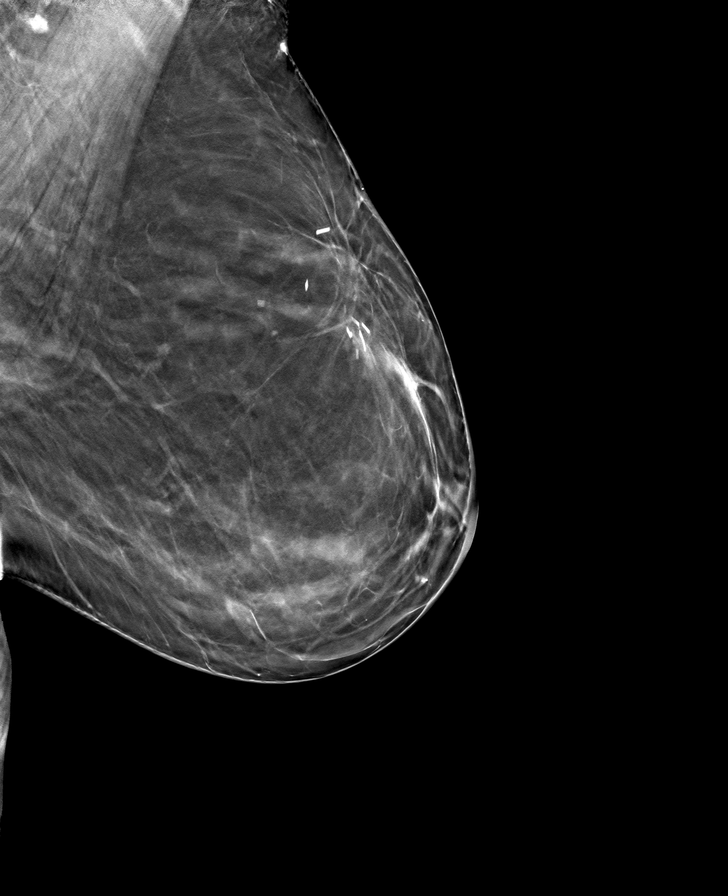

[R MLO tomo · tomo slice 37/74.0]
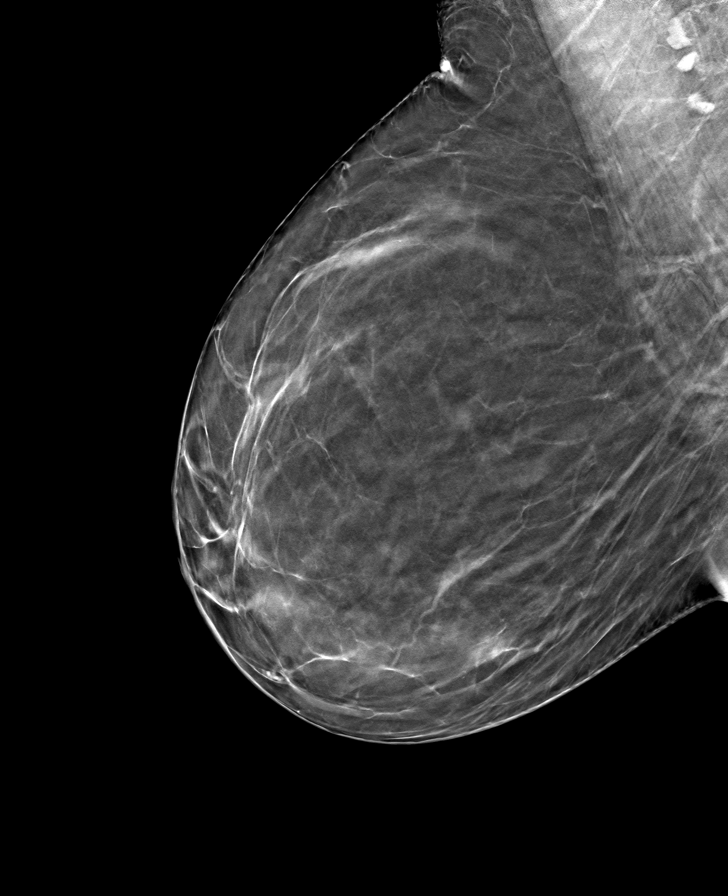

[8 of 24 positions shown; findings below may reference images not displayed]

ACR Breast Density Category b: There are scattered areas of
fibroglandular density.
FINDINGS: There are no findings suspicious for malignancy.
IMPRESSION: No mammographic evidence of malignancy. A result letter of this
screening mammogram will be mailed directly to the patient.

RECOMMENDATION:
Screening mammogram in one year. (Code:51-O-LD2)

BI-RADS CATEGORY  1: Negative.

## 2022-01-29 ENCOUNTER — Telehealth: Payer: Self-pay

## 2022-01-29 NOTE — Telephone Encounter (Signed)
Please arrange a telephone virtual visit for cardiac clearance.  Reassuring coronary CT in 2022.

## 2022-01-29 NOTE — Telephone Encounter (Signed)
   Pre-operative Risk Assessment    Patient Name: [name]  DOB: 07/25/1955 MRN: [id number]      Request for Surgical Clearance    Procedure:   Right Total Knee Replacement  Date of Surgery:  Clearance TBD                                 Surgeon:  Dr. Susa Day Surgeon's Group or Practice Name:  [facility] Phone number:  [phone] Fax number:  [phone] Derl Barrow   Type of Clearance Requested:   - Medical    Type of Anesthesia:  Not Indicated   Additional requests/questions:  Please advise surgeon/provider what medications should be held.  Signed, Jarnell Cordaro C Ivalee Strauser   01/29/2022, 1:20 PM

## 2022-01-30 ENCOUNTER — Telehealth: Payer: Self-pay | Admitting: *Deleted

## 2022-01-30 NOTE — Telephone Encounter (Signed)
Pt agreeable to tele pre op appt 02/06/22 @ 9 am. Med rec and consent are done.    Patient Consent for Virtual Visit        [name] has provided verbal consent on 01/30/2022 for a virtual visit (video or telephone).   CONSENT FOR VIRTUAL VISIT FOR:  [name]  By participating in this virtual visit I agree to the following:  I hereby voluntarily request, consent and authorize [facility] and its employed or contracted physicians, physician assistants, nurse practitioners or other licensed health care professionals (the Practitioner), to provide me with telemedicine health care services (the "Services") as deemed necessary by the treating Practitioner. I acknowledge and consent to receive the Services by the Practitioner via telemedicine. I understand that the telemedicine visit will involve communicating with the Practitioner through live audiovisual communication technology and the disclosure of certain medical information by electronic transmission. I acknowledge that I have been given the opportunity to request an in-person assessment or other available alternative prior to the telemedicine visit and am voluntarily participating in the telemedicine visit.  I understand that I have the right to withhold or withdraw my consent to the use of telemedicine in the course of my care at any time, without affecting my right to future care or treatment, and that the Practitioner or I may terminate the telemedicine visit at any time. I understand that I have the right to inspect all information obtained and/or recorded in the course of the telemedicine visit and may receive copies of available information for a reasonable fee.  I understand that some of the potential risks of receiving the Services via telemedicine include:  Delay or interruption in medical evaluation due to technological equipment failure or disruption; Information transmitted may not be sufficient (e.g. poor  resolution of images) to allow for appropriate medical decision making by the Practitioner; and/or  In rare instances, security protocols could fail, causing a breach of personal health information.  Furthermore, I acknowledge that it is my responsibility to provide information about my medical history, conditions and care that is complete and accurate to the best of my ability. I acknowledge that Practitioner's advice, recommendations, and/or decision may be based on factors not within their control, such as incomplete or inaccurate data provided by me or distortions of diagnostic images or specimens that may result from electronic transmissions. I understand that the practice of medicine is not an exact science and that Practitioner makes no warranties or guarantees regarding treatment outcomes. I acknowledge that a copy of this consent can be made available to me via my patient portal ([facility]), or I can request a printed copy by calling the office of [facility].    I understand that my insurance will be billed for this visit.   I have read or had this consent read to me. I understand the contents of this consent, which adequately explains the benefits and risks of the Services being provided via telemedicine.  I have been provided ample opportunity to ask questions regarding this consent and the Services and have had my questions answered to my satisfaction. I give my informed consent for the services to be provided through the use of telemedicine in my medical care

## 2022-01-30 NOTE — Telephone Encounter (Signed)
Pt agreeable to tele pre op appt 02/06/22 @ 9 am. Med rec and consent are done.

## 2022-02-06 ENCOUNTER — Ambulatory Visit (INDEPENDENT_AMBULATORY_CARE_PROVIDER_SITE_OTHER): Payer: Medicare Other | Admitting: Nurse Practitioner

## 2022-02-06 DIAGNOSIS — Z01810 Encounter for preprocedural cardiovascular examination: Secondary | ICD-10-CM

## 2022-02-06 NOTE — Progress Notes (Signed)
Virtual Visit via Telephone Note   Because of [name]'s co-morbid illnesses, she is at least at moderate risk for complications without adequate follow up.  This format is felt to be most appropriate for this patient at this time.  The patient did not have access to video technology/had technical difficulties with video requiring transitioning to audio format only (telephone).  All issues noted in this document were discussed and addressed.  No physical exam could be performed with this format.  Please refer to the patient's chart for her consent to telehealth for [facility].  Evaluation Performed:  Preoperative cardiovascular risk assessment _____________   Date:  02/06/2022   Patient ID:  [name], DOB [birth year], MRN [id number] Patient Location:  Home Provider location:   Office  Primary Care Provider:  Harlan Stains, MD Primary Cardiologist:  Glenetta Hew, MD  Chief Complaint / Patient Profile   66 y.o. y/o female with a h/o HTN, HLD, COPD, pre-DM 2, OSA, (reported history of MVP), and family history of CAD-MI and CVA as well as CHF  who is pending right total knee replacement and presents today for telephonic preoperative cardiovascular risk assessment.  Past Medical History    Past Medical History:  Diagnosis Date   Aortic atherosclerosis ([place])    Seen on imaging studies   Cervical disc disease    Complication of anesthesia    prolonged sedation   COPD (chronic obstructive pulmonary disease) with emphysema ([facility])    GERD (gastroesophageal reflux disease)    HTN (hypertension)    Hyperlipidemia    Lumbar herniated disc 07/07/2021   MDD (major depressive disorder), recurrent, in partial remission ([facility])    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  Minimum saturation  79%-Dr. Elenore Rota   Osteoarthritis    generalized   PONV (postoperative nausea and vomiting)    Prediabetes    Tobacco dependence    Vision abnormalities    Past Surgical History:  Procedure Laterality Date   ANTERIOR AND POSTERIOR REPAIR N/A 10/16/2015   Procedure: ANTERIOR (CYSTOCELE) AND POSTERIOR REPAIR (RECTOCELE);  Surgeon: Nunzio Cobbs, MD;  Location: [facility];  Service: Gynecology;  Laterality: N/A;   APPENDECTOMY  1974   BLADDER SUSPENSION N/A 10/16/2015   Procedure: TRANSVAGINAL TAPE (TVT) PROCEDURE exact midurethral sling;  Surgeon: Nunzio Cobbs, MD;  Location: [facility];  Service: Gynecology;  Laterality: N/A;   BREAST BIOPSY     negative x 2    BREAST EXCISIONAL BIOPSY Left 1998   BREAST EXCISIONAL BIOPSY Right 1986   BUNIONECTOMY     w/hammer toe repair   CARPAL TUNNEL RELEASE Right    CERVICAL [place] SURGERY  12/2004   CT CTA CORONARY W/CA SCORE W/CM &/OR WO/CM  04/26/2019   Coronary calcium score 0.  No evidence of CAD.   CYSTO N/A 10/16/2015   Procedure: Kathrene Alu;  Surgeon: Nunzio Cobbs, MD;  Location: [facility];  Service: Gynecology;  Laterality: N/A;   CYSTOSCOPY N/A 02/26/2016   Procedure: CYSTOSCOPY;  Surgeon: Nunzio Cobbs, MD;  Location: [facility];  Service: Gynecology;  Laterality: N/A;   KNEE ARTHROSCOPY     right knee x 2   MENISCUS REPAIR     right knee x 1   TENS  upper and lower back   TONSILECTOMY, ADENOIDECTOMY, BILATERAL MYRINGOTOMY AND TUBES     TOTAL VAGINAL HYSTERECTOMY  05/2004   adenomyosis, prolapse--ovaries remain   TRANSTHORACIC ECHOCARDIOGRAM  05/17/2019   EF 60 to 65%.  No R WMA.  Normal diastolic numbers.  No MR or MS.  No comment on MVP.  Mild aortic sclerosis with no stenosis.  Normal RAP.   TRANSVAGINAL TAPE (TVT) REMOVAL N/A 02/26/2016   Procedure: Lysis of mid urethral sling, Anterior Colporrhaphy, Cystoscopy ;  Surgeon: Nunzio Cobbs, MD;  Location: [facility];  Service: Gynecology;   Laterality: N/A;  first case/ move first case to follow this. Need 1 hour/    TUBAL LIGATION      Allergies  Allergies  Allergen Reactions   Aripiprazole Other (See Comments)    Felt hyper   Fluoxetine     Ineffective   Hydrocodone Other (See Comments)    Headache   Other Other (See Comments)   Percocet [Oxycodone-Acetaminophen] Other (See Comments)    Causes headaches    History of Present Illness    [name] is a 66 y.o. female who presents via audio/video conferencing for a telehealth visit today.  Pt was last seen in cardiology clinic on 07/30/2021 by Dr. Ellyn Hack.  At that time [name] was doing well with no recent episodes of chest pain, dyspnea, orthopnea.  She did report occasional palpitations that occur with knee pain.  The patient is now pending procedure as outlined above. Since her last visit, she has not experienced any cardiac related complaints.  She also states that her palpitations have completely resolved.   Home Medications    Prior to Admission medications   Medication Sig Start Date End Date Taking? Authorizing Provider  acetaminophen (TYLENOL) 500 MG tablet Take 1,000 mg by mouth daily as needed for mild pain. Take 1000 mg daily    [provider]  acetaZOLAMIDE (DIAMOX) 250 MG tablet Take 1 tablet twice daily 08/06/21   Lomax, Amy, NP  ALPRAZolam (XANAX) 0.5 MG tablet Take 0.25-0.5 mg by mouth 2 (two) times daily as needed for anxiety.     [provider]  atorvastatin (LIPITOR) 20 MG tablet Take 1 tablet by mouth daily. 08/29/21   [provider]  brexpiprazole (REXULTI) 2 MG TABS tablet 1 tablet 08/29/16   [provider]  Cholecalciferol (VITAMIN D-3) 5000 UNITS TABS Take 1 tablet by mouth daily.    [provider]  Coenzyme Q10 300 MG CAPS Take 30 mg by mouth daily.    [provider]  desvenlafaxine (PRISTIQ) 100 MG 24 hr tablet Take 1 tablet by mouth daily.    [provider]  ibuprofen (ADVIL,MOTRIN) 600 MG tablet Take 1 tablet (600 mg total) by mouth every 6 (six) hours as needed (mild pain). 10/17/15   Nunzio Cobbs, MD  losartan (COZAAR) 50 MG tablet Take 1 tablet by mouth daily.    [provider]  magnesium oxide (MAG-OX) 400 MG tablet Take 400 mg by mouth daily.    [provider]  metoprolol succinate (TOPROL-XL) 100 MG 24 hr tablet Take 1 tablet by mouth daily.    [provider]  omega-3 acid ethyl esters (LOVAZA) 1 g capsule Take 2 capsules by mouth 2 (two) times daily. 09/03/21   [provider]  omeprazole (PRILOSEC) 20 MG capsule Take 20 mg by mouth daily.    [provider]  triamcinolone (NASACORT)  55 MCG/ACT AERO nasal inhaler Place into the nose. 08/09/21   [provider]    Physical Exam    Vital Signs:  Marcell Anger does not have vital signs available for review today.  None for today  Given telephonic nature of communication, physical exam is limited. AAOx3. NAD. Normal affect.  Speech and respirations are unlabored.  Accessory Clinical Findings    None  Assessment & Plan    1.  Preoperative Cardiovascular Risk Assessment:  The patient affirms she has been doing well without any new cardiac symptoms. They are able to achieve 4 METS without cardiac limitations. Therefore, based on ACC/AHA guidelines, the patient would be at acceptable risk for the planned procedure without further cardiovascular testing. The patient was advised that if she develops new symptoms prior to surgery to contact our office to arrange for a follow-up visit, and she verbalized understanding.  { Ms. Sumpter's perioperative risk of a major cardiac event is 0.9% according to the Revised Cardiac Risk Index (RCRI).  Therefore, she is at low risk for perioperative complications.   Her functional capacity is good at 4.31 METs according to the Duke Activity Status Index  (DASI). Recommendations: According to ACC/AHA guidelines, no further cardiovascular testing needed.  The patient may proceed to surgery at acceptable risk.   Antiplatelet and/or Anticoagulation Recommendations:   No medications to hold  A copy of this note will be routed to requesting surgeon.  Time:   Today, I have spent 6 minutes with the patient with telehealth technology discussing medical history, symptoms, and management plan.     Mable Fill, Marissa Nestle, NP  02/06/2022, 7:30 AM

## 2022-03-03 ENCOUNTER — Ambulatory Visit: Payer: Self-pay | Admitting: Orthopedic Surgery

## 2022-03-04 DIAGNOSIS — J449 Chronic obstructive pulmonary disease, unspecified: Secondary | ICD-10-CM | POA: Diagnosis not present

## 2022-03-04 DIAGNOSIS — I10 Essential (primary) hypertension: Secondary | ICD-10-CM | POA: Diagnosis not present

## 2022-03-04 DIAGNOSIS — F17200 Nicotine dependence, unspecified, uncomplicated: Secondary | ICD-10-CM | POA: Diagnosis not present

## 2022-03-04 DIAGNOSIS — E782 Mixed hyperlipidemia: Secondary | ICD-10-CM | POA: Diagnosis not present

## 2022-03-04 DIAGNOSIS — I700 Atherosclerosis of aorta: Secondary | ICD-10-CM | POA: Diagnosis not present

## 2022-03-04 DIAGNOSIS — K219 Gastro-esophageal reflux disease without esophagitis: Secondary | ICD-10-CM | POA: Diagnosis not present

## 2022-03-14 DIAGNOSIS — G4733 Obstructive sleep apnea (adult) (pediatric): Secondary | ICD-10-CM | POA: Diagnosis not present

## 2022-03-26 ENCOUNTER — Ambulatory Visit
Admission: RE | Admit: 2022-03-26 | Discharge: 2022-03-26 | Disposition: A | Discharge status: Home / Self Care | Payer: Medicare Other | Source: Ambulatory Visit | Attending: Family Medicine | Admitting: Family Medicine

## 2022-03-26 ENCOUNTER — Ambulatory Visit
Admission: RE | Admit: 2022-03-26 | Discharge: 2022-03-26 | Disposition: A | Discharge status: Home / Self Care | Payer: Medicare Other | Source: Ambulatory Visit | Attending: Obstetrics and Gynecology | Admitting: Obstetrics and Gynecology

## 2022-03-26 DIAGNOSIS — E2839 Other primary ovarian failure: Secondary | ICD-10-CM

## 2022-03-26 DIAGNOSIS — Z1231 Encounter for screening mammogram for malignant neoplasm of breast: Secondary | ICD-10-CM

## 2022-03-26 DIAGNOSIS — M8589 Other specified disorders of bone density and structure, multiple sites: Secondary | ICD-10-CM | POA: Diagnosis not present

## 2022-03-26 DIAGNOSIS — Z780 Asymptomatic menopausal state: Secondary | ICD-10-CM | POA: Diagnosis not present

## 2022-03-26 NOTE — Patient Instructions (Addendum)
SURGICAL WAITING ROOM VISITATION Patients having surgery or a procedure may have no more than 2 support people in the waiting area - these visitors may rotate.   Children under the age of 75 must have an adult with them who is not the patient. If the patient needs to stay at the hospital during part of their recovery, the visitor guidelines for inpatient rooms apply. Pre-op nurse will coordinate an appropriate time for 1 support person to accompany patient in pre-op.  This support person may not rotate.    Please refer to the [facility] website for the visitor guidelines for Inpatients (after your surgery is over and you are in a regular room).      Your procedure is scheduled on: 04-09-22   Report to [facility] Main Entrance    Report to admitting at 6:00 AM   Call this number if you have problems the morning of surgery [phone]   Do not eat food :After Midnight.   After Midnight you may have the following liquids until 5:30 AM DAY OF SURGERY  Water Non-Citrus Juices (without pulp, NO RED) Carbonated Beverages Black Coffee (NO MILK/CREAM OR CREAMERS, sugar ok)  Clear Tea (NO MILK/CREAM OR CREAMERS, sugar ok) regular and decaf                             Plain Jell-O (NO RED)                                           Fruit ices (not with fruit pulp, NO RED)                                     Popsicles (NO RED)                                                               Sports drinks like Gatorade (NO RED)                   The day of surgery:  Drink ONE (1) Pre-Surgery G2 at 5:30 AM the morning of surgery. Drink in one sitting. Do not sip.  This drink was given to you during your hospital  pre-op appointment visit. Nothing else to drink after completing the Pre-Surgery G2.          If you have questions, please contact your surgeon's office.   FOLLOW ANY ADDITIONAL PRE OP INSTRUCTIONS YOU RECEIVED FROM YOUR SURGEON'S OFFICE!!!     Oral Hygiene is also  important to reduce your risk of infection.                                    Remember - BRUSH YOUR TEETH THE MORNING OF SURGERY WITH YOUR REGULAR TOOTHPASTE   Do NOT smoke after Midnight   Take these medicines the morning of surgery with A SIP OF WATER:   Metoprolol  Omeprazole  DO NOT TAKE ANY ORAL DIABETIC MEDICATIONS DAY OF  YOUR SURGERY  Bring CPAP mask and tubing day of surgery.                              You may not have any metal on your body including hair pins, jewelry, and body piercing             Do not wear make-up, lotions, powders, perfumes or deodorant  Do not wear nail polish including gel and S&S, artificial/acrylic nails, or any other type of covering on natural nails including finger and toenails. If you have artificial nails, gel coating, etc. that needs to be removed by a nail salon please have this removed prior to surgery or surgery may need to be canceled/ delayed if the surgeon/ anesthesia feels like they are unable to be safely monitored.   Do not shave  48 hours prior to surgery.    Do not bring valuables to the hospital. [place].   Contacts, dentures or bridgework may not be worn into surgery.   Bring small overnight bag day of surgery.   DO NOT [place]. PHARMACY WILL DISPENSE MEDICATIONS LISTED ON YOUR MEDICATION LIST TO YOU DURING YOUR ADMISSION [place]!   Special Instructions: Bring a copy of your healthcare power of attorney and living will documents the day of surgery if you haven't scanned them before.  Please read over the following fact sheets you were given: IF [place]  If you received a COVID test during your pre-op visit  it is requested that you wear a mask when out in public, stay away from anyone that may not be feeling well and notify your surgeon if you develop symptoms. If you test  positive for Covid or have been in contact with anyone that has tested positive in the last 10 days please notify you surgeon.  [place] - Preparing for Surgery Before surgery, you can play an important role.  Because skin is not sterile, your skin needs to be as free of germs as possible.  You can reduce the number of germs on your skin by washing with CHG (chlorahexidine gluconate) soap before surgery.  CHG is an antiseptic cleaner which kills germs and bonds with the skin to continue killing germs even after washing. Please DO NOT use if you have an allergy to CHG or antibacterial soaps.  If your skin becomes reddened/irritated stop using the CHG and inform your nurse when you arrive at Short Stay. Do not shave (including legs and underarms) for at least 48 hours prior to the first CHG shower.  You may shave your face/neck.  Please follow these instructions carefully:  1.  Shower with CHG Soap the night before surgery and the  morning of surgery.  2.  If you choose to wash your hair, wash your hair first as usual with your normal  shampoo.  3.  After you shampoo, rinse your hair and body thoroughly to remove the shampoo.                             4.  Use CHG as you would any other liquid soap.  You can apply chg directly to the skin and wash.  Gently with a scrungie or clean washcloth.  5.  Apply the CHG  Soap to your body ONLY FROM THE NECK DOWN.   Do   not use on face/ open                           Wound or open sores. Avoid contact with eyes, ears mouth and   genitals (private parts).                       Wash face,  Genitals (private parts) with your normal soap.             6.  Wash thoroughly, paying special attention to the area where your    surgery  will be performed.  7.  Thoroughly rinse your body with warm water from the neck down.  8.  DO NOT shower/wash with your normal soap after using and rinsing off the CHG Soap.                9.  Pat yourself dry with a clean towel.             10.  Wear clean pajamas.            11.  Place clean sheets on your bed the night of your first shower and do not  sleep with pets. Day of Surgery : Do not apply any lotions/deodorants the morning of surgery.  Please wear clean clothes to the hospital/surgery center.  FAILURE TO FOLLOW THESE INSTRUCTIONS MAY RESULT IN THE CANCELLATION OF YOUR SURGERY  PATIENT SIGNATURE_________________________________  NURSE SIGNATURE__________________________________  ________________________________________________________________________     Adam [place]  An incentive spirometer is a tool that can help keep your lungs clear and active. This tool measures how well you are filling your lungs with each breath. Taking long deep breaths may help reverse or decrease the chance of developing breathing (pulmonary) problems (especially infection) following: A long period of time when you are unable to move or be active. BEFORE THE PROCEDURE  If the spirometer includes an indicator to show your best effort, your nurse or respiratory therapist will set it to a desired goal. If possible, sit up straight or lean slightly forward. Try not to slouch. Hold the incentive spirometer in an upright position. INSTRUCTIONS FOR USE  Sit on the edge of your bed if possible, or sit up as far as you can in bed or on a chair. Hold the incentive spirometer in an upright position. Breathe out normally. Place the mouthpiece in your mouth and seal your lips tightly around it. Breathe in slowly and as deeply as possible, raising the piston or the ball toward the top of the column. Hold your breath for 3-5 seconds or for as long as possible. Allow the piston or ball to fall to the bottom of the column. Remove the mouthpiece from your mouth and breathe out normally. Rest for a few seconds and repeat Steps 1 through 7 at least 10 times every 1-2 hours when you are awake. Take your time and take a few normal breaths  between deep breaths. The spirometer may include an indicator to show your best effort. Use the indicator as a goal to work toward during each repetition. After each set of 10 deep breaths, practice coughing to be sure your lungs are clear. If you have an incision (the cut made at the time of surgery), support your incision when coughing by placing a pillow or rolled up towels firmly against it. Once  you are able to get out of bed, walk around indoors and cough well. You may stop using the incentive spirometer when instructed by your caregiver.  RISKS AND COMPLICATIONS Take your time so you do not get dizzy or light-headed. If you are in pain, you may need to take or ask for pain medication before doing incentive spirometry. It is harder to take a deep breath if you are having pain. AFTER USE Rest and breathe slowly and easily. It can be helpful to keep track of a log of your progress. Your caregiver can provide you with a simple table to help with this. If you are using the spirometer at home, follow these instructions: [place] IF:  You are having difficultly using the spirometer. You have trouble using the spirometer as often as instructed. Your pain medication is not giving enough relief while using the spirometer. You develop fever of 100.5 F (38.1 C) or higher. SEEK IMMEDIATE MEDICAL CARE IF:  You cough up bloody sputum that had not been present before. You develop fever of 102 F (38.9 C) or greater. You develop worsening pain at or near the incision site. MAKE SURE YOU:  Understand these instructions. Will watch your condition. Will get help right away if you are not doing well or get worse. Document Released: 11/03/2006 Document Revised: 09/15/2011 Document Reviewed: 01/04/2007 [facility] Patient Information 2014 [place], [place].   ________________________________________________________________________

## 2022-03-26 NOTE — Progress Notes (Signed)
COVID Vaccine Completed:  Yes  Date of COVID positive in last 90 days:  PCP - Harlan Stains, MD Cardiologist - Glenetta Hew, MD Neurologist - Christell Constant, MD  Cardiac clearance in Epic dated 02-06-22 by Ambrose Pancoast, NP  Chest x-ray -  EKG - 04-24-21 Epic Stress Test -  ECHO - 05-16-21 Epic Cardiac Cath -  Pacemaker/ICD device last checked: Spinal Cord Stimulator: Coronary CT - 05-03-21 Epic  Bowel Prep -   Sleep Study -  Yes, +sleep apnea CPAP -   Fasting Blood Sugar -  Checks Blood Sugar _____ times a day  Blood Thinner Instructions: Aspirin Instructions: Last Dose:  Activity level:  Can go up a flight of stairs and perform activities of daily living without stopping and without symptoms of chest pain or shortness of breath.  Able to exercise without symptoms  Unable to go up a flight of stairs without symptoms of     Anesthesia review:  MVP, coronary artery calcification,  COPD, HTN, OSA. 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  Patient denies shortness of breath, fever, cough and chest pain at PAT appointment  Patient verbalized understanding of instructions that were given to them at the PAT appointment. Patient was also instructed that they will need to review over the PAT instructions again at home before surgery.

## 2022-03-27 ENCOUNTER — Encounter (HOSPITAL_COMMUNITY)
Admission: RE | Admit: 2022-03-27 | Discharge: 2022-03-27 | Disposition: A | Discharge status: Home / Self Care | Payer: Medicare Other | Source: Ambulatory Visit | Attending: Specialist | Admitting: Specialist

## 2022-03-27 ENCOUNTER — Other Ambulatory Visit: Payer: Self-pay

## 2022-03-27 ENCOUNTER — Encounter (HOSPITAL_COMMUNITY): Payer: Self-pay

## 2022-03-27 VITALS — BP 139/78 | HR 55 | Temp 98.1°F | Resp 20 | Ht 62.5 in | Wt 140.0 lb

## 2022-03-27 DIAGNOSIS — Z01812 Encounter for preprocedural laboratory examination: Secondary | ICD-10-CM | POA: Diagnosis not present

## 2022-03-27 DIAGNOSIS — R7303 Prediabetes: Secondary | ICD-10-CM | POA: Insufficient documentation

## 2022-03-27 DIAGNOSIS — I2510 Atherosclerotic heart disease of native coronary artery without angina pectoris: Secondary | ICD-10-CM

## 2022-03-27 DIAGNOSIS — Z01818 Encounter for other preprocedural examination: Secondary | ICD-10-CM

## 2022-03-27 HISTORY — DX: Prediabetes: R73.03

## 2022-03-27 HISTORY — DX: Family history of other specified conditions: Z84.89

## 2022-03-27 HISTORY — DX: Anxiety disorder, unspecified: F41.9

## 2022-03-27 LAB — SURGICAL PCR SCREEN
MRSA, PCR: NEGATIVE
Staphylococcus aureus: POSITIVE — AB

## 2022-03-27 LAB — CBC
HCT: 43.7 % (ref 36.0–46.0)
Hemoglobin: 13.7 g/dL (ref 12.0–15.0)
MCH: 29.1 pg (ref 26.0–34.0)
MCHC: 31.4 g/dL (ref 30.0–36.0)
MCV: 93 fL (ref 80.0–100.0)
Platelets: 342 10*3/uL (ref 150–400)
RBC: 4.7 MIL/uL (ref 3.87–5.11)
RDW: 13.3 % (ref 11.5–15.5)
WBC: 10.6 10*3/uL — ABNORMAL HIGH (ref 4.0–10.5)
nRBC: 0 % (ref 0.0–0.2)

## 2022-03-27 LAB — GLUCOSE, CAPILLARY: Glucose-Capillary: 98 mg/dL (ref 70–99)

## 2022-03-27 LAB — BASIC METABOLIC PANEL
Anion gap: 5 (ref 5–15)
BUN: 15 mg/dL (ref 8–23)
CO2: 22 mmol/L (ref 22–32)
Calcium: 9.5 mg/dL (ref 8.9–10.3)
Chloride: 114 mmol/L — ABNORMAL HIGH (ref 98–111)
Creatinine, Ser: 1.14 mg/dL — ABNORMAL HIGH (ref 0.44–1.00)
GFR, Estimated: 53 mL/min — ABNORMAL LOW (ref >60)
Glucose, Bld: 102 mg/dL — ABNORMAL HIGH (ref 70–99)
Potassium: 4.9 mmol/L (ref 3.5–5.1)
Sodium: 141 mmol/L (ref 135–145)

## 2022-03-27 LAB — HEMOGLOBIN A1C
Hgb A1c MFr Bld: 5.8 % — ABNORMAL HIGH (ref 4.8–5.6)
Mean Plasma Glucose: 119.76 mg/dL

## 2022-03-28 NOTE — Progress Notes (Signed)
PCR results sent to Dr. Tonita Cong to review.

## 2022-04-02 ENCOUNTER — Ambulatory Visit: Payer: Self-pay | Admitting: Orthopedic Surgery

## 2022-04-02 NOTE — H&P (View-Only) (Signed)
[name] is an 66 y.o. female.   Chief Complaint: Right knee pain HPI: [name] is here today for history and physical appointment in preparation for her right TKA scheduled 04/09/22 at [facility].  Dr. Tonita Cong and the patient mutually agreed to proceed with a total knee replacement. Risks and benefits of the procedure were discussed including stiffness, suboptimal range of motion, persistent pain, infection requiring removal of prosthesis and reinsertion, need for prophylactic antibiotics in the future, for example, dental procedures, possible need for manipulation, revision in the future and also anesthetic complications including DVT, PE, etc. We discussed the perioperative course, time in the hospital, postoperative recovery and the need for elevation to control swelling. We also discussed the predicted range of motion and the probability that squatting and kneeling would be unobtainable in the future. In addition, postoperative anticoagulation was discussed. We have obtained preoperative medical clearance as necessary. Provided illustrated handout and discussed it in detail. They will enroll in the total joint replacement educational forum at the hospital.  Past hx of slow wakeup from anesthesia and PONV. Will likely benefit from a spinal. Reports last few surgeries have been better with adjustments anesthesia made. No hx of DVT. Has tolerated ASA without an issue in the past.  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  Minimum saturation 79%-Dr. Elenore Rota   Osteoarthritis    generalized   PONV (postoperative nausea and vomiting)    Pre-diabetes    Improved with weigth loss   Prediabetes    Tobacco dependence    Vision abnormalities     Past Surgical History:  Procedure Laterality Date   ANTERIOR AND POSTERIOR REPAIR N/A 10/16/2015   Procedure: ANTERIOR (CYSTOCELE) AND POSTERIOR REPAIR (RECTOCELE);  Surgeon: Nunzio Cobbs, MD;  Location: [facility];  Service: Gynecology;  Laterality: N/A;   APPENDECTOMY  1974   BLADDER SUSPENSION N/A 10/16/2015   Procedure: TRANSVAGINAL TAPE (TVT) PROCEDURE exact midurethral sling;  Surgeon: Nunzio Cobbs, MD;  Location: [facility];  Service: Gynecology;  Laterality: N/A;   BREAST BIOPSY     negative x 2    BREAST EXCISIONAL BIOPSY Left 1998   BREAST EXCISIONAL BIOPSY Right 1986   BUNIONECTOMY     w/hammer toe repair   CARPAL TUNNEL RELEASE Right    CERVICAL [place] SURGERY  12/2004   CT CTA CORONARY W/CA SCORE W/CM &/OR WO/CM  04/26/2019   Coronary calcium score 0.  No evidence of CAD.   CYSTO N/A 10/16/2015   Procedure: Kathrene Alu;  Surgeon: Nunzio Cobbs, MD;  Location: [facility];  Service: Gynecology;  Laterality: N/A;   CYSTOSCOPY N/A 02/26/2016   Procedure: CYSTOSCOPY;  Surgeon: Nunzio Cobbs, MD;  Location: [facility];  Service: Gynecology;  Laterality: N/A;   KNEE ARTHROSCOPY     right knee x 2   MENISCUS REPAIR     right knee x 1   TENS     upper and lower back   TONSILECTOMY, ADENOIDECTOMY, BILATERAL MYRINGOTOMY AND TUBES     TOTAL VAGINAL HYSTERECTOMY  05/2004   adenomyosis, prolapse--ovaries remain   TRANSTHORACIC ECHOCARDIOGRAM  05/17/2019   EF 60 to 65%.  No R WMA.  Normal diastolic numbers.  No MR or MS.  No  comment on MVP.  Mild aortic sclerosis with no stenosis.  Normal RAP.   TRANSVAGINAL TAPE (TVT) REMOVAL N/A 02/26/2016   Procedure: Lysis of mid urethral sling, Anterior Colporrhaphy, Cystoscopy ;  Surgeon: Nunzio Cobbs, MD;  Location: [facility];  Service: Gynecology;  Laterality: N/A;  first case/ move first case to follow this. Need 1 hour/    TUBAL LIGATION      Family History  Problem Relation Age of Onset   Heart failure Mother        congestive-presumably nonischemic cardiomyopathy   Diabetes Mellitus II Mother    Hypertension Mother    Coronary artery disease Father 43       MI   Diabetes Father    Heart attack Father 89       x2   Stroke Father 21   Hypertension Father    Hyperlipidemia Father    Diabetes Mellitus II Father    Cancer Brother 33       lung cancer   Cancer Brother        metastasized, unknown origin   Diabetes Brother    Colon cancer Paternal Grandmother    Breast cancer Cousin    Breast cancer Cousin    Social History:  reports that she has been smoking cigarettes. She has a 12.00 pack-year smoking history. She has never used smokeless tobacco. She reports that she does not drink alcohol and does not use drugs.  Allergies:  Allergies  Allergen Reactions   Aripiprazole Other (See Comments)    Felt hyper   Fluoxetine Other (See Comments)    Ineffective   Hydrocodone Other (See Comments)    Headache   Percocet [Oxycodone-Acetaminophen] Other (See Comments)    Causes headaches   Current meds: acetaZOLAMIDE 250 mg tablet ALPRAZolam 0.5 mg tablet atorvastatin 20 mg tablet desvenlafaxine succinate ER 100 mg tablet,extended release 24 hr fenofibrate 160 mg tablet losartan 50 mg tablet methocarbamoL 500 mg tablet metoprolol succinate ER 100 mg tablet,extended release 24 hr omega-3 acid ethyl esters 1 gram capsule Rexulti 2 mg tablet traMADoL 50 mg tablet  Review of Systems  Constitutional: Negative.   HENT: Negative.    Eyes:  Negative.   Respiratory: Negative.    Cardiovascular: Negative.   Gastrointestinal: Negative.   Endocrine: Negative.   Genitourinary: Negative.   Musculoskeletal:  Positive for arthralgias, gait problem, joint swelling and myalgias.  Psychiatric/Behavioral: Negative.      Last menstrual period 03/07/2004. Physical Exam Constitutional:      Appearance: Normal appearance.  HENT:     Head: Normocephalic and atraumatic.     Right Ear: External ear normal.     Left Ear: External ear normal.     Nose: Nose normal.     Mouth/Throat:     Pharynx: Oropharynx is clear.  Eyes:     Conjunctiva/sclera: Conjunctivae normal.  Cardiovascular:     Rate and Rhythm: Normal rate and regular  rhythm.     Pulses: Normal pulses.  Pulmonary:     Effort: Pulmonary effort is normal.  Abdominal:     General: Abdomen is flat.  Musculoskeletal:     Comments: Walks an antalgic gait. Exquisitely tender to medial joint line and lateral joint line. Ranges 0-140. No DVT.  Neurological:     Mental Status: She is alert.    X-rays PA standing today demonstrate end-stage osteoarthrosis medial compartment of the knee with a slight varus deformity. MRI demonstrates retear of the meniscus extrusion of the meniscus and advanced osteoarthrosis of the medial compartment.  Assessment/Plan Impression: End stage R knee DJD  Plan: Pt with end-stage Right knee DJD, bone-on-bone, refractory to conservative tx, scheduled for Right total knee replacement by Dr. Tonita Cong on 04/09/22. We again discussed the procedure itself as well as risks, complications and alternatives, including but not limited to DVT, PE, infx, bleeding, failure of procedure, need for secondary procedure including manipulation, nerve injury, ongoing pain/symptoms, anesthesia risk, even stroke or death. Also discussed typical post-op protocols, activity restrictions, need for PT, flexion/extension exercises, time out of work. Discussed need for DVT ppx post-op  per protocol. Discussed dental ppx and infx prevention. Also discussed limitations post-operatively such as kneeling and squatting. All questions were answered. Patient desires to proceed with surgery as scheduled.  Will hold supplements, ASA and NSAIDs accordingly. Will remain NPO after midnight the night before surgery. [facility] to [facility] for pre-op testing. Anticipate hospital stay to include at least 2 midnights given medical history and to ensure proper pain control. Plan ASA for DVT ppx post-op. Plan pain meds, Tizanidine, Colace, Miralax. Plan outpatient PT post-op and will return to home with her wife. Has equipment at home from friends. Will follow up 10-14 days post-op for suture removal and xrays.  Plan right total knee replacement  Cecilie Kicks, PA-C for Dr Tonita Cong 04/02/2022, 8:26 AM

## 2022-04-02 NOTE — H&P (Signed)
[name] is an 66 y.o. female.   Chief Complaint: Right knee pain HPI: [name] is here today for history and physical appointment in preparation for her right TKA scheduled 04/09/22 at [facility].  Dr. Tonita Cong and the patient mutually agreed to proceed with a total knee replacement. Risks and benefits of the procedure were discussed including stiffness, suboptimal range of motion, persistent pain, infection requiring removal of prosthesis and reinsertion, need for prophylactic antibiotics in the future, for example, dental procedures, possible need for manipulation, revision in the future and also anesthetic complications including DVT, PE, etc. We discussed the perioperative course, time in the hospital, postoperative recovery and the need for elevation to control swelling. We also discussed the predicted range of motion and the probability that squatting and kneeling would be unobtainable in the future. In addition, postoperative anticoagulation was discussed. We have obtained preoperative medical clearance as necessary. Provided illustrated handout and discussed it in detail. They will enroll in the total joint replacement educational forum at the hospital.  Past hx of slow wakeup from anesthesia and PONV. Will likely benefit from a spinal. Reports last few surgeries have been better with adjustments anesthesia made. No hx of DVT. Has tolerated ASA without an issue in the past.  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  Minimum saturation 79%-Dr. Elenore Rota   Osteoarthritis    generalized   PONV (postoperative nausea and vomiting)    Pre-diabetes    Improved with weigth loss   Prediabetes    Tobacco dependence    Vision abnormalities     Past Surgical History:  Procedure Laterality Date   ANTERIOR AND POSTERIOR REPAIR N/A 10/16/2015   Procedure: ANTERIOR (CYSTOCELE) AND POSTERIOR REPAIR (RECTOCELE);  Surgeon: Nunzio Cobbs, MD;  Location: [facility];  Service: Gynecology;  Laterality: N/A;   APPENDECTOMY  1974   BLADDER SUSPENSION N/A 10/16/2015   Procedure: TRANSVAGINAL TAPE (TVT) PROCEDURE exact midurethral sling;  Surgeon: Nunzio Cobbs, MD;  Location: [facility];  Service: Gynecology;  Laterality: N/A;   BREAST BIOPSY     negative x 2    BREAST EXCISIONAL BIOPSY Left 1998   BREAST EXCISIONAL BIOPSY Right 1986   BUNIONECTOMY     w/hammer toe repair   CARPAL TUNNEL RELEASE Right    CERVICAL [place] SURGERY  12/2004   CT CTA CORONARY W/CA SCORE W/CM &/OR WO/CM  04/26/2019   Coronary calcium score 0.  No evidence of CAD.   CYSTO N/A 10/16/2015   Procedure: Kathrene Alu;  Surgeon: Nunzio Cobbs, MD;  Location: [facility];  Service: Gynecology;  Laterality: N/A;   CYSTOSCOPY N/A 02/26/2016   Procedure: CYSTOSCOPY;  Surgeon: Nunzio Cobbs, MD;  Location: [facility];  Service: Gynecology;  Laterality: N/A;   KNEE ARTHROSCOPY     right knee x 2   MENISCUS REPAIR     right knee x 1   TENS     upper and lower back   TONSILECTOMY, ADENOIDECTOMY, BILATERAL MYRINGOTOMY AND TUBES     TOTAL VAGINAL HYSTERECTOMY  05/2004   adenomyosis, prolapse--ovaries remain   TRANSTHORACIC ECHOCARDIOGRAM  05/17/2019   EF 60 to 65%.  No R WMA.  Normal diastolic numbers.  No MR or MS.  No  comment on MVP.  Mild aortic sclerosis with no stenosis.  Normal RAP.   TRANSVAGINAL TAPE (TVT) REMOVAL N/A 02/26/2016   Procedure: Lysis of mid urethral sling, Anterior Colporrhaphy, Cystoscopy ;  Surgeon: Nunzio Cobbs, MD;  Location: [facility];  Service: Gynecology;  Laterality: N/A;  first case/ move first case to follow this. Need 1 hour/    TUBAL LIGATION      Family History  Problem Relation Age of Onset   Heart failure Mother        congestive-presumably nonischemic cardiomyopathy   Diabetes Mellitus II Mother    Hypertension Mother    Coronary artery disease Father 64       MI   Diabetes Father    Heart attack Father 71       x2   Stroke Father 46   Hypertension Father    Hyperlipidemia Father    Diabetes Mellitus II Father    Cancer Brother 90       lung cancer   Cancer Brother        metastasized, unknown origin   Diabetes Brother    Colon cancer Paternal Grandmother    Breast cancer Cousin    Breast cancer Cousin    Social History:  reports that she has been smoking cigarettes. She has a 12.00 pack-year smoking history. She has never used smokeless tobacco. She reports that she does not drink alcohol and does not use drugs.  Allergies:  Allergies  Allergen Reactions   Aripiprazole Other (See Comments)    Felt hyper   Fluoxetine Other (See Comments)    Ineffective   Hydrocodone Other (See Comments)    Headache   Percocet [Oxycodone-Acetaminophen] Other (See Comments)    Causes headaches   Current meds: acetaZOLAMIDE 250 mg tablet ALPRAZolam 0.5 mg tablet atorvastatin 20 mg tablet desvenlafaxine succinate ER 100 mg tablet,extended release 24 hr fenofibrate 160 mg tablet losartan 50 mg tablet methocarbamoL 500 mg tablet metoprolol succinate ER 100 mg tablet,extended release 24 hr omega-3 acid ethyl esters 1 gram capsule Rexulti 2 mg tablet traMADoL 50 mg tablet  Review of Systems  Constitutional: Negative.   HENT: Negative.    Eyes:  Negative.   Respiratory: Negative.    Cardiovascular: Negative.   Gastrointestinal: Negative.   Endocrine: Negative.   Genitourinary: Negative.   Musculoskeletal:  Positive for arthralgias, gait problem, joint swelling and myalgias.  Psychiatric/Behavioral: Negative.      Last menstrual period 03/07/2004. Physical Exam Constitutional:      Appearance: Normal appearance.  HENT:     Head: Normocephalic and atraumatic.     Right Ear: External ear normal.     Left Ear: External ear normal.     Nose: Nose normal.     Mouth/Throat:     Pharynx: Oropharynx is clear.  Eyes:     Conjunctiva/sclera: Conjunctivae normal.  Cardiovascular:     Rate and Rhythm: Normal rate and regular  rhythm.     Pulses: Normal pulses.  Pulmonary:     Effort: Pulmonary effort is normal.  Abdominal:     General: Abdomen is flat.  Musculoskeletal:     Comments: Walks an antalgic gait. Exquisitely tender to medial joint line and lateral joint line. Ranges 0-140. No DVT.  Neurological:     Mental Status: She is alert.    X-rays PA standing today demonstrate end-stage osteoarthrosis medial compartment of the knee with a slight varus deformity. MRI demonstrates retear of the meniscus extrusion of the meniscus and advanced osteoarthrosis of the medial compartment.  Assessment/Plan Impression: End stage R knee DJD  Plan: Pt with end-stage Right knee DJD, bone-on-bone, refractory to conservative tx, scheduled for Right total knee replacement by Dr. Tonita Cong on 04/09/22. We again discussed the procedure itself as well as risks, complications and alternatives, including but not limited to DVT, PE, infx, bleeding, failure of procedure, need for secondary procedure including manipulation, nerve injury, ongoing pain/symptoms, anesthesia risk, even stroke or death. Also discussed typical post-op protocols, activity restrictions, need for PT, flexion/extension exercises, time out of work. Discussed need for DVT ppx post-op  per protocol. Discussed dental ppx and infx prevention. Also discussed limitations post-operatively such as kneeling and squatting. All questions were answered. Patient desires to proceed with surgery as scheduled.  Will hold supplements, ASA and NSAIDs accordingly. Will remain NPO after midnight the night before surgery. Will present to [facility] for pre-op testing. Anticipate hospital stay to include at least 2 midnights given medical history and to ensure proper pain control. Plan ASA for DVT ppx post-op. Plan pain meds, Tizanidine, Colace, Miralax. Plan outpatient PT post-op and will return to home with her wife. Has equipment at home from friends. Will follow up 10-14 days post-op for suture removal and xrays.  Plan right total knee replacement  Cecilie Kicks, PA-C for Dr Tonita Cong 04/02/2022, 8:26 AM

## 2022-04-09 ENCOUNTER — Encounter (HOSPITAL_COMMUNITY): Payer: Self-pay | Admitting: Specialist

## 2022-04-09 ENCOUNTER — Encounter (HOSPITAL_COMMUNITY)
Admission: RE | Disposition: A | Discharge status: Home / Self Care | Payer: Self-pay | Source: Home / Self Care | Attending: Specialist

## 2022-04-09 ENCOUNTER — Other Ambulatory Visit: Payer: Self-pay

## 2022-04-09 ENCOUNTER — Observation Stay (HOSPITAL_COMMUNITY)
Admission: RE | Admit: 2022-04-09 | Discharge: 2022-04-11 | Disposition: A | Discharge status: Home / Self Care | Payer: Medicare Other | Attending: Specialist | Admitting: Specialist

## 2022-04-09 ENCOUNTER — Ambulatory Visit (HOSPITAL_BASED_OUTPATIENT_CLINIC_OR_DEPARTMENT_OTHER): Payer: Medicare Other | Admitting: Anesthesiology

## 2022-04-09 ENCOUNTER — Ambulatory Visit (HOSPITAL_COMMUNITY): Payer: Medicare Other

## 2022-04-09 ENCOUNTER — Ambulatory Visit (HOSPITAL_COMMUNITY): Payer: Medicare Other | Admitting: Physician Assistant

## 2022-04-09 DIAGNOSIS — G4730 Sleep apnea, unspecified: Secondary | ICD-10-CM | POA: Diagnosis not present

## 2022-04-09 DIAGNOSIS — M1711 Unilateral primary osteoarthritis, right knee: Principal | ICD-10-CM | POA: Diagnosis present

## 2022-04-09 DIAGNOSIS — F17210 Nicotine dependence, cigarettes, uncomplicated: Secondary | ICD-10-CM | POA: Insufficient documentation

## 2022-04-09 DIAGNOSIS — I10 Essential (primary) hypertension: Secondary | ICD-10-CM | POA: Insufficient documentation

## 2022-04-09 DIAGNOSIS — Z96651 Presence of right artificial knee joint: Secondary | ICD-10-CM | POA: Diagnosis not present

## 2022-04-09 DIAGNOSIS — M21161 Varus deformity, not elsewhere classified, right knee: Secondary | ICD-10-CM | POA: Diagnosis not present

## 2022-04-09 DIAGNOSIS — F418 Other specified anxiety disorders: Secondary | ICD-10-CM | POA: Diagnosis not present

## 2022-04-09 DIAGNOSIS — M7989 Other specified soft tissue disorders: Secondary | ICD-10-CM | POA: Diagnosis not present

## 2022-04-09 DIAGNOSIS — J449 Chronic obstructive pulmonary disease, unspecified: Secondary | ICD-10-CM | POA: Insufficient documentation

## 2022-04-09 DIAGNOSIS — G8918 Other acute postprocedural pain: Secondary | ICD-10-CM | POA: Diagnosis not present

## 2022-04-09 DIAGNOSIS — R7303 Prediabetes: Secondary | ICD-10-CM

## 2022-04-09 DIAGNOSIS — Z9889 Other specified postprocedural states: Secondary | ICD-10-CM | POA: Diagnosis not present

## 2022-04-09 DIAGNOSIS — Z471 Aftercare following joint replacement surgery: Secondary | ICD-10-CM | POA: Diagnosis not present

## 2022-04-09 HISTORY — PX: TOTAL KNEE ARTHROPLASTY: SHX125

## 2022-04-09 SURGERY — ARTHROPLASTY, KNEE, TOTAL
Anesthesia: Regional | Site: Knee | Laterality: Right

## 2022-04-09 MED ORDER — TRANEXAMIC ACID-NACL 1000-0.7 MG/100ML-% IV SOLN
1000.0000 mg | INTRAVENOUS | Status: AC
  Administered 2022-04-09: 1000 mg via INTRAVENOUS
  Filled 2022-04-09: qty 100

## 2022-04-09 MED ORDER — ONDANSETRON HCL 4 MG/2ML IJ SOLN
INTRAMUSCULAR | Status: DC | PRN
  Administered 2022-04-09: 4 mg via INTRAVENOUS

## 2022-04-09 MED ORDER — PHENYLEPHRINE HCL-NACL 20-0.9 MG/250ML-% IV SOLN
INTRAVENOUS | Status: AC
  Filled 2022-04-09: qty 250

## 2022-04-09 MED ORDER — PHENOL 1.4 % MT LIQD: 1.0000 | OROMUCOSAL | Status: DC | PRN

## 2022-04-09 MED ORDER — LACTATED RINGERS IV SOLN: INTRAVENOUS | Status: DC

## 2022-04-09 MED ORDER — BUPIVACAINE LIPOSOME 1.3 % IJ SUSP
INTRAMUSCULAR | Status: AC
  Filled 2022-04-09: qty 20

## 2022-04-09 MED ORDER — MIDAZOLAM HCL 5 MG/5ML IJ SOLN
INTRAMUSCULAR | Status: DC | PRN
  Administered 2022-04-09: 2 mg via INTRAVENOUS

## 2022-04-09 MED ORDER — DOCUSATE SODIUM 100 MG PO CAPS
100.0000 mg | ORAL_CAPSULE | Freq: Two times a day (BID) | ORAL | Status: DC
  Administered 2022-04-09 – 2022-04-11 (×4): 100 mg via ORAL
  Filled 2022-04-09 (×4): qty 1

## 2022-04-09 MED ORDER — LOSARTAN POTASSIUM 25 MG PO TABS
25.0000 mg | ORAL_TABLET | Freq: Every day | ORAL | Status: DC
  Administered 2022-04-09 – 2022-04-10 (×2): 25 mg via ORAL
  Filled 2022-04-09 (×2): qty 1

## 2022-04-09 MED ORDER — BUPIVACAINE-EPINEPHRINE 0.25% -1:200000 IJ SOLN
INTRAMUSCULAR | Status: DC | PRN
  Administered 2022-04-09: 30 mL

## 2022-04-09 MED ORDER — DIPHENHYDRAMINE HCL 12.5 MG/5ML PO ELIX: 12.5000 mg | ORAL_SOLUTION | ORAL | Status: DC | PRN

## 2022-04-09 MED ORDER — PANTOPRAZOLE SODIUM 40 MG PO TBEC: 40.0000 mg | DELAYED_RELEASE_TABLET | Freq: Every day | ORAL | Status: DC

## 2022-04-09 MED ORDER — DEXAMETHASONE SODIUM PHOSPHATE 10 MG/ML IJ SOLN
INTRAMUSCULAR | Status: AC
  Filled 2022-04-09: qty 1

## 2022-04-09 MED ORDER — PROPOFOL 1000 MG/100ML IV EMUL
INTRAVENOUS | Status: AC
  Filled 2022-04-09: qty 100

## 2022-04-09 MED ORDER — ORAL CARE MOUTH RINSE: 15.0000 mL | Freq: Once | OROMUCOSAL | Status: AC

## 2022-04-09 MED ORDER — METOCLOPRAMIDE HCL 5 MG/ML IJ SOLN
5.0000 mg | Freq: Three times a day (TID) | INTRAMUSCULAR | Status: DC | PRN
  Administered 2022-04-10: 10 mg via INTRAVENOUS
  Filled 2022-04-09: qty 2

## 2022-04-09 MED ORDER — STERILE WATER FOR IRRIGATION IR SOLN
Status: DC | PRN
  Administered 2022-04-09: 2000 mL

## 2022-04-09 MED ORDER — PANTOPRAZOLE SODIUM 40 MG PO TBEC
40.0000 mg | DELAYED_RELEASE_TABLET | Freq: Every day | ORAL | Status: DC
  Administered 2022-04-10: 40 mg via ORAL
  Filled 2022-04-09 (×2): qty 1

## 2022-04-09 MED ORDER — FENTANYL CITRATE (PF) 100 MCG/2ML IJ SOLN
INTRAMUSCULAR | Status: AC
  Filled 2022-04-09: qty 2

## 2022-04-09 MED ORDER — TRIAMCINOLONE ACETONIDE 55 MCG/ACT NA AERO: 2.0000 | INHALATION_SPRAY | Freq: Every day | NASAL | Status: DC | PRN

## 2022-04-09 MED ORDER — POLYETHYLENE GLYCOL 3350 17 G PO PACK: 17.0000 g | PACK | Freq: Every day | ORAL | Status: DC | PRN

## 2022-04-09 MED ORDER — MENTHOL 3 MG MT LOZG: 1.0000 | LOZENGE | OROMUCOSAL | Status: DC | PRN

## 2022-04-09 MED ORDER — ACETAMINOPHEN 500 MG PO TABS
1000.0000 mg | ORAL_TABLET | Freq: Four times a day (QID) | ORAL | Status: AC
  Administered 2022-04-09 – 2022-04-10 (×3): 1000 mg via ORAL
  Filled 2022-04-09 (×3): qty 2

## 2022-04-09 MED ORDER — FENTANYL CITRATE (PF) 100 MCG/2ML IJ SOLN
INTRAMUSCULAR | Status: DC | PRN
  Administered 2022-04-09 (×2): 50 ug via INTRAVENOUS

## 2022-04-09 MED ORDER — HYDROMORPHONE HCL 1 MG/ML IJ SOLN
0.5000 mg | INTRAMUSCULAR | Status: DC | PRN
  Administered 2022-04-10 (×2): 1 mg via INTRAVENOUS
  Filled 2022-04-09 (×2): qty 1

## 2022-04-09 MED ORDER — VITAMIN D 25 MCG (1000 UNIT) PO TABS
5000.0000 [IU] | ORAL_TABLET | Freq: Every day | ORAL | Status: DC
  Administered 2022-04-11: 5000 [IU] via ORAL
  Filled 2022-04-09 (×3): qty 5

## 2022-04-09 MED ORDER — METHOCARBAMOL 500 MG IVPB - SIMPLE MED
500.0000 mg | Freq: Four times a day (QID) | INTRAVENOUS | Status: DC | PRN
  Administered 2022-04-09: 500 mg via INTRAVENOUS
  Filled 2022-04-09: qty 55

## 2022-04-09 MED ORDER — MAGNESIUM OXIDE -MG SUPPLEMENT 400 (240 MG) MG PO TABS
400.0000 mg | ORAL_TABLET | Freq: Every day | ORAL | Status: DC
  Administered 2022-04-09 – 2022-04-10 (×2): 400 mg via ORAL
  Filled 2022-04-09 (×2): qty 1

## 2022-04-09 MED ORDER — BISACODYL 5 MG PO TBEC: 5.0000 mg | DELAYED_RELEASE_TABLET | Freq: Every day | ORAL | Status: DC | PRN

## 2022-04-09 MED ORDER — MIDAZOLAM HCL 2 MG/2ML IJ SOLN
INTRAMUSCULAR | Status: AC
  Filled 2022-04-09: qty 2

## 2022-04-09 MED ORDER — METHOCARBAMOL 500 MG IVPB - SIMPLE MED
INTRAVENOUS | Status: AC
  Filled 2022-04-09: qty 55

## 2022-04-09 MED ORDER — METOPROLOL SUCCINATE ER 50 MG PO TB24
100.0000 mg | ORAL_TABLET | Freq: Every day | ORAL | Status: DC
  Administered 2022-04-10 – 2022-04-11 (×2): 100 mg via ORAL
  Filled 2022-04-09 (×2): qty 2

## 2022-04-09 MED ORDER — 0.9 % SODIUM CHLORIDE (POUR BTL) OPTIME
TOPICAL | Status: DC | PRN
  Administered 2022-04-09: 1000 mL

## 2022-04-09 MED ORDER — ALUM & MAG HYDROXIDE-SIMETH 200-200-20 MG/5ML PO SUSP: 30.0000 mL | ORAL | Status: DC | PRN

## 2022-04-09 MED ORDER — BUPIVACAINE-EPINEPHRINE (PF) 0.25% -1:200000 IJ SOLN
INTRAMUSCULAR | Status: AC
  Filled 2022-04-09: qty 30

## 2022-04-09 MED ORDER — METHOCARBAMOL 500 MG PO TABS
500.0000 mg | ORAL_TABLET | Freq: Four times a day (QID) | ORAL | Status: DC | PRN
  Administered 2022-04-10 – 2022-04-11 (×3): 500 mg via ORAL
  Filled 2022-04-09 (×3): qty 1

## 2022-04-09 MED ORDER — ONDANSETRON HCL 4 MG/2ML IJ SOLN
4.0000 mg | Freq: Four times a day (QID) | INTRAMUSCULAR | Status: DC | PRN
  Administered 2022-04-09 – 2022-04-10 (×3): 4 mg via INTRAVENOUS
  Filled 2022-04-09 (×3): qty 2

## 2022-04-09 MED ORDER — ACETAMINOPHEN 325 MG PO TABS
325.0000 mg | ORAL_TABLET | Freq: Four times a day (QID) | ORAL | Status: DC | PRN
  Filled 2022-04-09: qty 2

## 2022-04-09 MED ORDER — HYDROMORPHONE HCL 1 MG/ML IJ SOLN
0.2500 mg | INTRAMUSCULAR | Status: DC | PRN
  Administered 2022-04-09: 0.25 mg via INTRAVENOUS

## 2022-04-09 MED ORDER — BUPIVACAINE LIPOSOME 1.3 % IJ SUSP
INTRAMUSCULAR | Status: DC | PRN
  Administered 2022-04-09: 20 mL

## 2022-04-09 MED ORDER — PROMETHAZINE HCL 25 MG/ML IJ SOLN: 6.2500 mg | INTRAMUSCULAR | Status: DC | PRN

## 2022-04-09 MED ORDER — ROPIVACAINE HCL 5 MG/ML IJ SOLN
INTRAMUSCULAR | Status: DC | PRN
  Administered 2022-04-09: 20 mL via PERINEURAL

## 2022-04-09 MED ORDER — ONDANSETRON HCL 4 MG PO TABS
4.0000 mg | ORAL_TABLET | Freq: Four times a day (QID) | ORAL | Status: DC | PRN
  Filled 2022-04-09: qty 1

## 2022-04-09 MED ORDER — COENZYME Q10 300 MG PO CAPS: 300.0000 mg | ORAL_CAPSULE | Freq: Every day | ORAL | Status: DC

## 2022-04-09 MED ORDER — ACETAMINOPHEN 10 MG/ML IV SOLN
1000.0000 mg | INTRAVENOUS | Status: AC
  Administered 2022-04-09: 1000 mg via INTRAVENOUS
  Filled 2022-04-09: qty 100

## 2022-04-09 MED ORDER — ASPIRIN 81 MG PO CHEW
81.0000 mg | CHEWABLE_TABLET | Freq: Two times a day (BID) | ORAL | Status: DC
  Administered 2022-04-10 – 2022-04-11 (×3): 81 mg via ORAL
  Filled 2022-04-09 (×3): qty 1

## 2022-04-09 MED ORDER — HYDROMORPHONE HCL 2 MG PO TABS
2.0000 mg | ORAL_TABLET | ORAL | Status: DC | PRN
  Administered 2022-04-10: 2 mg via ORAL
  Administered 2022-04-10 – 2022-04-11 (×3): 3 mg via ORAL
  Filled 2022-04-09 (×2): qty 2
  Filled 2022-04-09: qty 1
  Filled 2022-04-09: qty 2

## 2022-04-09 MED ORDER — DEXAMETHASONE SODIUM PHOSPHATE 10 MG/ML IJ SOLN
INTRAMUSCULAR | Status: DC | PRN
  Administered 2022-04-09: 8 mg via INTRAVENOUS

## 2022-04-09 MED ORDER — PHENYLEPHRINE HCL-NACL 20-0.9 MG/250ML-% IV SOLN
INTRAVENOUS | Status: DC | PRN
  Administered 2022-04-09: 50 ug/min via INTRAVENOUS

## 2022-04-09 MED ORDER — METOCLOPRAMIDE HCL 5 MG PO TABS: 5.0000 mg | ORAL_TABLET | Freq: Three times a day (TID) | ORAL | Status: DC | PRN

## 2022-04-09 MED ORDER — ONDANSETRON HCL 4 MG/2ML IJ SOLN
INTRAMUSCULAR | Status: AC
  Filled 2022-04-09: qty 2

## 2022-04-09 MED ORDER — SODIUM CHLORIDE 0.9 % IR SOLN
Status: DC | PRN
  Administered 2022-04-09: 1000 mL

## 2022-04-09 MED ORDER — BUPIVACAINE IN DEXTROSE 0.75-8.25 % IT SOLN
INTRATHECAL | Status: DC | PRN
  Administered 2022-04-09: 1.6 mL via INTRATHECAL

## 2022-04-09 MED ORDER — MAGNESIUM CITRATE PO SOLN: 1.0000 | Freq: Once | ORAL | Status: DC | PRN

## 2022-04-09 MED ORDER — SODIUM CHLORIDE 0.9% FLUSH
INTRAVENOUS | Status: DC | PRN
  Administered 2022-04-09: 40 mL

## 2022-04-09 MED ORDER — VENLAFAXINE HCL ER 150 MG PO CP24
150.0000 mg | ORAL_CAPSULE | Freq: Every day | ORAL | Status: DC
  Administered 2022-04-11: 150 mg via ORAL
  Filled 2022-04-09 (×3): qty 1

## 2022-04-09 MED ORDER — CEFAZOLIN SODIUM-DEXTROSE 2-4 GM/100ML-% IV SOLN
2.0000 g | INTRAVENOUS | Status: AC
  Administered 2022-04-09: 2 g via INTRAVENOUS
  Filled 2022-04-09: qty 100

## 2022-04-09 MED ORDER — SODIUM CHLORIDE (PF) 0.9 % IJ SOLN
INTRAMUSCULAR | Status: AC
  Filled 2022-04-09: qty 50

## 2022-04-09 MED ORDER — PROPOFOL 500 MG/50ML IV EMUL
INTRAVENOUS | Status: DC | PRN
  Administered 2022-04-09: 50 ug/kg/min via INTRAVENOUS

## 2022-04-09 MED ORDER — ALPRAZOLAM 0.25 MG PO TABS
0.2500 mg | ORAL_TABLET | Freq: Every day | ORAL | Status: DC
  Administered 2022-04-09 – 2022-04-10 (×2): 0.25 mg via ORAL
  Filled 2022-04-09 (×2): qty 1

## 2022-04-09 MED ORDER — ACETAZOLAMIDE 250 MG PO TABS
250.0000 mg | ORAL_TABLET | Freq: Every day | ORAL | Status: DC
  Administered 2022-04-10 – 2022-04-11 (×2): 250 mg via ORAL
  Filled 2022-04-09 (×3): qty 1

## 2022-04-09 MED ORDER — HYDROMORPHONE HCL 2 MG PO TABS
1.0000 mg | ORAL_TABLET | ORAL | Status: DC | PRN
  Administered 2022-04-09 – 2022-04-10 (×4): 2 mg via ORAL
  Filled 2022-04-09 (×4): qty 1

## 2022-04-09 MED ORDER — RISAQUAD PO CAPS
1.0000 | ORAL_CAPSULE | Freq: Every day | ORAL | Status: DC
  Administered 2022-04-11: 1 via ORAL
  Filled 2022-04-09 (×3): qty 1

## 2022-04-09 MED ORDER — KCL IN DEXTROSE-NACL 20-5-0.45 MEQ/L-%-% IV SOLN
INTRAVENOUS | Status: AC
  Filled 2022-04-09 (×2): qty 1000

## 2022-04-09 MED ORDER — CHLORHEXIDINE GLUCONATE 0.12 % MT SOLN
15.0000 mL | Freq: Once | OROMUCOSAL | Status: AC
  Administered 2022-04-09: 15 mL via OROMUCOSAL

## 2022-04-09 MED ORDER — BREXPIPRAZOLE 1 MG PO TABS
2.0000 mg | ORAL_TABLET | Freq: Every day | ORAL | Status: DC
  Administered 2022-04-09 – 2022-04-10 (×2): 2 mg via ORAL
  Filled 2022-04-09: qty 1
  Filled 2022-04-09 (×2): qty 2

## 2022-04-09 MED ORDER — CEFAZOLIN SODIUM-DEXTROSE 1-4 GM/50ML-% IV SOLN
1.0000 g | Freq: Four times a day (QID) | INTRAVENOUS | Status: AC
  Administered 2022-04-09 (×2): 1 g via INTRAVENOUS
  Filled 2022-04-09 (×2): qty 50

## 2022-04-09 MED ORDER — RISAQUAD PO CAPS: 1.0000 | ORAL_CAPSULE | Freq: Every day | ORAL | Status: DC

## 2022-04-09 MED ORDER — HYDROMORPHONE HCL 1 MG/ML IJ SOLN
INTRAMUSCULAR | Status: AC
  Administered 2022-04-09: 0.25 mg via INTRAVENOUS
  Filled 2022-04-09: qty 1

## 2022-04-09 SURGICAL SUPPLY — 73 items
AGENT HMST SPONGE THK3/8 (HEMOSTASIS)
ATTUNE PS FEM RT SZ 6 CEM KNEE (Femur) IMPLANT
ATTUNE PSRP INSR SZ6 5 KNEE (Insert) IMPLANT
BAG COUNTER SPONGE SURGICOUNT (BAG) IMPLANT
BAG DECANTER FOR FLEXI CONT (MISCELLANEOUS) ×1 IMPLANT
BAG SPEC THK2 15X12 ZIP CLS (MISCELLANEOUS)
BAG SPNG CNTER NS LX DISP (BAG)
BAG ZIPLOCK 12X15 (MISCELLANEOUS) IMPLANT
BASE TIBIAL ROT PLAT SZ 5 KNEE (Knees) IMPLANT
BLADE SAW SGTL 11.0X1.19X90.0M (BLADE) ×1 IMPLANT
BLADE SAW SGTL 13.0X1.19X90.0M (BLADE) IMPLANT
BLADE SURG SZ10 CARB STEEL (BLADE) ×2 IMPLANT
BNDG ELASTIC 4X5.8 VLCR STR LF (GAUZE/BANDAGES/DRESSINGS) ×1 IMPLANT
BNDG ELASTIC 6X5.8 VLCR STR LF (GAUZE/BANDAGES/DRESSINGS) ×1 IMPLANT
BOWL SMART MIX CTS (DISPOSABLE) ×1 IMPLANT
BSPLAT TIB 5 CMNT ROT PLAT STR (Knees) ×1 IMPLANT
CEMENT HV SMART SET (Cement) ×2 IMPLANT
COVER SURGICAL LIGHT HANDLE (MISCELLANEOUS) ×1 IMPLANT
CUFF TOURN SGL QUICK 34 (TOURNIQUET CUFF) ×1
CUFF TRNQT CYL 34X4.125X (TOURNIQUET CUFF) ×1 IMPLANT
DRAPE INCISE IOBAN 66X45 STRL (DRAPES) IMPLANT
DRAPE ORTHO SPLIT 77X108 STRL (DRAPES) ×2
DRAPE SHEET LG 3/4 BI-LAMINATE (DRAPES) ×1 IMPLANT
DRAPE SURG ORHT 6 SPLT 77X108 (DRAPES) ×2 IMPLANT
DRAPE U-SHAPE 47X51 STRL (DRAPES) ×1 IMPLANT
DRSG AQUACEL AG ADV 3.5X10 (GAUZE/BANDAGES/DRESSINGS) ×1 IMPLANT
DRSG TEGADERM 4X4.75 (GAUZE/BANDAGES/DRESSINGS) IMPLANT
DURAPREP 26ML APPLICATOR (WOUND CARE) ×1 IMPLANT
ELECT BLADE TIP CTD 4 INCH (ELECTRODE) IMPLANT
ELECT REM PT RETURN 15FT ADLT (MISCELLANEOUS) ×1 IMPLANT
EVACUATOR 1/8 PVC DRAIN (DRAIN) IMPLANT
GAUZE SPONGE 2X2 8PLY STRL LF (GAUZE/BANDAGES/DRESSINGS) IMPLANT
GLOVE BIO SURGEON STRL SZ7 (GLOVE) ×1 IMPLANT
GLOVE BIOGEL PI IND STRL 7.0 (GLOVE) ×1 IMPLANT
GLOVE BIOGEL PI IND STRL 8 (GLOVE) ×1 IMPLANT
GLOVE SURG SS PI 8.0 STRL IVOR (GLOVE) ×1 IMPLANT
GOWN STRL REUS W/ TWL XL LVL3 (GOWN DISPOSABLE) ×2 IMPLANT
GOWN STRL REUS W/TWL XL LVL3 (GOWN DISPOSABLE) ×2
HANDPIECE INTERPULSE COAX TIP (DISPOSABLE) ×1
HEMOSTAT SPONGE AVITENE ULTRA (HEMOSTASIS) IMPLANT
HOLDER FOLEY CATH W/STRAP (MISCELLANEOUS) IMPLANT
IMMOBILIZER KNEE 20 (SOFTGOODS) ×1
IMMOBILIZER KNEE 20 THIGH 36 (SOFTGOODS) ×1 IMPLANT
KIT TURNOVER KIT A (KITS) IMPLANT
MANIFOLD NEPTUNE II (INSTRUMENTS) ×1 IMPLANT
NS IRRIG 1000ML POUR BTL (IV SOLUTION) IMPLANT
PACK TOTAL KNEE CUSTOM (KITS) ×1 IMPLANT
PATELLA MEDIAL ATTUN 35MM KNEE (Knees) IMPLANT
PROTECTOR NERVE ULNAR (MISCELLANEOUS) ×1 IMPLANT
SAW OSC TIP CART 19.5X105X1.3 (SAW) ×1 IMPLANT
SEALER BIPOLAR AQUA 6.0 (INSTRUMENTS) IMPLANT
SET HNDPC FAN SPRY TIP SCT (DISPOSABLE) ×1 IMPLANT
SOLUTION PRONTOSAN WOUND 350ML (IRRIGATION / IRRIGATOR) ×1 IMPLANT
SPIKE FLUID TRANSFER (MISCELLANEOUS) ×1 IMPLANT
STAPLER VISISTAT (STAPLE) IMPLANT
STRIP CLOSURE SKIN 1/2X4 (GAUZE/BANDAGES/DRESSINGS) IMPLANT
SUT BONE WAX W31G (SUTURE) ×1 IMPLANT
SUT MNCRL AB 4-0 PS2 18 (SUTURE) IMPLANT
SUT STRATAFIX 0 PDS 27 VIOLET (SUTURE) ×1
SUT STRATAFIX 1PDS 45CM VIOLET (SUTURE) IMPLANT
SUT STRATAFIX PDS+ 0 24IN (SUTURE) IMPLANT
SUT VIC AB 1 CT1 27 (SUTURE) ×3
SUT VIC AB 1 CT1 27XBRD ANTBC (SUTURE) ×3 IMPLANT
SUT VIC AB 2-0 CT1 27 (SUTURE) ×3
SUT VIC AB 2-0 CT1 TAPERPNT 27 (SUTURE) ×3 IMPLANT
SUTURE STRATFX 0 PDS 27 VIOLET (SUTURE) ×1 IMPLANT
SYR 3ML LL SCALE MARK (SYRINGE) IMPLANT
TAPE STRIPS DRAPE STRL (GAUZE/BANDAGES/DRESSINGS) IMPLANT
TIBIAL BASE ROT PLAT SZ 5 KNEE (Knees) ×1 IMPLANT
TRAY FOLEY MTR SLVR 16FR STAT (SET/KITS/TRAYS/PACK) ×1 IMPLANT
WATER STERILE IRR 1000ML POUR (IV SOLUTION) ×1 IMPLANT
WIPE CHG 2% 2PK PREOPERATIVE (MISCELLANEOUS) ×1 IMPLANT
WRAP KNEE MAXI GEL POST OP (GAUZE/BANDAGES/DRESSINGS) ×1 IMPLANT

## 2022-04-09 NOTE — Discharge Instructions (Signed)
Elevate leg above heart 6x a day for 8minutes each Use knee immobilizer while walking until can SLR x 10 Use knee immobilizer in bed to keep knee in extension Aquacel dressing may remain in place until follow up. May shower with aquacel dressing in place. If the dressing becomes saturated or peels off, you may remove aquacel dressing. Do not remove steri-strips if they are present. Place new dressing with gauze and tape or ACE bandage which should be kept clean and dry and changed daily.   INSTRUCTIONS AFTER JOINT REPLACEMENT   Remove items at home which could result in a fall. This includes throw rugs or furniture in walking pathways ICE to the affected joint every three hours while awake for 30 minutes at a time, for at least the first 3-5 days, and then as needed for pain and swelling.  Continue to use ice for pain and swelling. You may notice swelling that will progress down to the foot and ankle.  This is normal after surgery.  Elevate your leg when you are not up walking on it.   Continue to use the breathing machine you got in the hospital (incentive spirometer) which will help keep your temperature down.  It is common for your temperature to cycle up and down following surgery, especially at night when you are not up moving around and exerting yourself.  The breathing machine keeps your lungs expanded and your temperature down.   DIET:  As you were doing prior to hospitalization, we recommend a well-balanced diet.  DRESSING / WOUND CARE / SHOWERING  Keep the surgical dressing until follow up.  The dressing is water proof, so you can shower without any extra covering.  IF THE DRESSING FALLS OFF or the wound gets wet inside, change the dressing with sterile gauze.  Please use good hand washing techniques before changing the dressing.  Do not use any lotions or creams on the incision until instructed by your surgeon.    ACTIVITY  Increase activity slowly as tolerated, but follow the weight  bearing instructions below.   No driving for 6 weeks or until further direction given by your physician.  You cannot drive while taking narcotics.  No lifting or carrying greater than 10 lbs. until further directed by your surgeon. Avoid periods of inactivity such as sitting longer than an hour when not asleep. This helps prevent blood clots.  You may return to work once you are authorized by your doctor.     WEIGHT BEARING   Weight bearing as tolerated with assist device (walker, cane, etc) as directed, use it as long as suggested by your surgeon or therapist, typically at least 4-6 weeks.   EXERCISES  Results after joint replacement surgery are often greatly improved when you follow the exercise, range of motion and muscle strengthening exercises prescribed by your doctor. Safety measures are also important to protect the joint from further injury. Any time any of these exercises cause you to have increased pain or swelling, decrease what you are doing until you are comfortable again and then slowly increase them. If you have problems or questions, call your caregiver or physical therapist for advice.   Rehabilitation is important following a joint replacement. After just a few days of immobilization, the muscles of the leg can become weakened and shrink (atrophy).  These exercises are designed to build up the tone and strength of the thigh and leg muscles and to improve motion. Often times heat used for twenty to thirty  minutes before working out will loosen up your tissues and help with improving the range of motion but do not use heat for the first two weeks following surgery (sometimes heat can increase post-operative swelling).   These exercises can be done on a training (exercise) mat, on the floor, on a table or on a bed. Use whatever works the best and is most comfortable for you.    Use music or television while you are exercising so that the exercises are a pleasant break in your day.  This will make your life better with the exercises acting as a break in your routine that you can look forward to.   Perform all exercises about fifteen times, three times per day or as directed.  You should exercise both the operative leg and the other leg as well.  Exercises include:   Quad Sets - Tighten up the muscle on the front of the thigh (Quad) and hold for 5-10 seconds.   Straight Leg Raises - With your knee straight (if you were given a brace, keep it on), lift the leg to 60 degrees, hold for 3 seconds, and slowly lower the leg.  Perform this exercise against resistance later as your leg gets stronger.  Leg Slides: Lying on your back, slowly slide your foot toward your buttocks, bending your knee up off the floor (only go as far as is comfortable). Then slowly slide your foot back down until your leg is flat on the floor again.  Angel Wings: Lying on your back spread your legs to the side as far apart as you can without causing discomfort.  Hamstring Strength:  Lying on your back, push your heel against the floor with your leg straight by tightening up the muscles of your buttocks.  Repeat, but this time bend your knee to a comfortable angle, and push your heel against the floor.  You may put a pillow under the heel to make it more comfortable if necessary.   A rehabilitation program following joint replacement surgery can speed recovery and prevent re-injury in the future due to weakened muscles. Contact your doctor or a physical therapist for more information on knee rehabilitation.    CONSTIPATION  Constipation is defined medically as fewer than three stools per week and severe constipation as less than one stool per week.  Even if you have a regular bowel pattern at home, your normal regimen is likely to be disrupted due to multiple reasons following surgery.  Combination of anesthesia, postoperative narcotics, change in appetite and fluid intake all can affect your bowels.   YOU MUST  use at least one of the following options; they are listed in order of increasing strength to get the job done.  They are all available over the counter, and you may need to use some, POSSIBLY even all of these options:    Drink plenty of fluids (prune juice may be helpful) and high fiber foods Colace 100 mg by mouth twice a day  Senokot for constipation as directed and as needed Dulcolax (bisacodyl), take with full glass of water  Miralax (polyethylene glycol) once or twice a day as needed.  If you have tried all these things and are unable to have a bowel movement in the first 3-4 days after surgery call either your surgeon or your primary doctor.    If you experience loose stools or diarrhea, hold the medications until you stool forms back up.  If your symptoms do not get better within  1 week or if they get worse, check with your doctor.  If you experience "the worst abdominal pain ever" or develop nausea or vomiting, please contact the office immediately for further recommendations for treatment.   ITCHING:  If you experience itching with your medications, try taking only a single pain pill, or even half a pain pill at a time.  You can also use Benadryl over the counter for itching or also to help with sleep.   TED HOSE STOCKINGS:  Use stockings on both legs until for at least 2 weeks or as directed by physician office. They may be removed at night for sleeping.  MEDICATIONS:  See your medication summary on the "After Visit Summary" that nursing will review with you.  You may have some home medications which will be placed on hold until you complete the course of blood thinner medication.  It is important for you to complete the blood thinner medication as prescribed.  PRECAUTIONS:  If you experience chest pain or shortness of breath - call 911 immediately for transfer to the hospital emergency department.   If you develop a fever greater that 101 F, purulent drainage from wound, increased  redness or drainage from wound, foul odor from the wound/dressing, or calf pain - CONTACT YOUR SURGEON.                                                   FOLLOW-UP APPOINTMENTS:  If you do not already have a post-op appointment, please call the office for an appointment to be seen by your surgeon.  Guidelines for how soon to be seen are listed in your "After Visit Summary", but are typically between 1-4 weeks after surgery.  OTHER INSTRUCTIONS:   Knee Replacement:  Do not place pillow under knee, focus on keeping the knee straight while resting. CPM instructions: 0-90 degrees, 2 hours in the morning, 2 hours in the afternoon, and 2 hours in the evening. Place foam block, curve side up under heel at all times except when in CPM or when walking.  DO NOT modify, tear, cut, or change the foam block in any way.  POST-OPERATIVE OPIOID TAPER INSTRUCTIONS: It is important to wean off of your opioid medication as soon as possible. If you do not need pain medication after your surgery it is ok to stop day one. Opioids include: Codeine, Hydrocodone(Norco, Vicodin), Oxycodone(Percocet, oxycontin) and hydromorphone amongst others.  Long term and even short term use of opiods can cause: Increased pain response Dependence Constipation Depression Respiratory depression And more.  Withdrawal symptoms can include Flu like symptoms Nausea, vomiting And more Techniques to manage these symptoms Hydrate well Eat regular healthy meals Stay active Use relaxation techniques(deep breathing, meditating, yoga) Do Not substitute Alcohol to help with tapering If you have been on opioids for less than two weeks and do not have pain than it is ok to stop all together.  Plan to wean off of opioids This plan should start within one week post op of your joint replacement. Maintain the same interval or time between taking each dose and first decrease the dose.  Cut the total daily intake of opioids by one tablet each  day Next start to increase the time between doses. The last dose that should be eliminated is the evening dose.   MAKE SURE YOU:  Understand  these instructions.  Get help right away if you are not doing well or get worse.    Thank you for letting us be a part of your medical care team.  It is a privilege we respect greatly.  We hope these instructions will help you stay on track for a fast and full recovery!

## 2022-04-09 NOTE — Interval H&P Note (Signed)
History and Physical Interval Note:  04/09/2022 8:11 AM  [name]  has presented today for surgery, with the diagnosis of Right knee degenerative joint disease.  The various methods of treatment have been discussed with the patient and family. After consideration of risks, benefits and other options for treatment, the patient has consented to  Procedure(s): TOTAL KNEE ARTHROPLASTY (Right) as a surgical intervention.  The patient's history has been reviewed, patient examined, no change in status, stable for surgery.  I have reviewed the patient's chart and labs.  Questions were answered to the patient's satisfaction.     Johnn Hai

## 2022-04-09 NOTE — Anesthesia Procedure Notes (Signed)
Anesthesia Regional Block: Adductor canal block   Pre-Anesthetic Checklist: , timeout performed,  Correct Patient, Correct Site, Correct Laterality,  Correct Procedure, Correct Position, site marked,  Risks and benefits discussed,  Surgical consent,  Pre-op evaluation,  At surgeon's request and post-op pain management  Laterality: Right  Prep: chloraprep       Needles:  Injection technique: Single-shot  Needle Type: Stimiplex     Needle Length: 9cm  Needle Gauge: 21     Additional Needles:   Procedures:,,,, ultrasound used (permanent image in chart),,    Narrative:  Start time: 04/09/2022 8:11 AM End time: 04/09/2022 8:16 AM Injection made incrementally with aspirations every 5 mL.  Performed by: Personally  Anesthesiologist: Lynda Rainwater, MD

## 2022-04-09 NOTE — Anesthesia Postprocedure Evaluation (Signed)
Anesthesia Post Note  Patient: [name]  Procedure(s) Performed: TOTAL KNEE ARTHROPLASTY (Right: Knee)     Patient location during evaluation: PACU Anesthesia Type: Regional and Spinal Level of consciousness: awake and alert Pain management: pain level controlled Vital Signs Assessment: post-procedure vital signs reviewed and stable Respiratory status: spontaneous breathing, nonlabored ventilation and respiratory function stable Cardiovascular status: blood pressure returned to baseline and stable Postop Assessment: no apparent nausea or vomiting Anesthetic complications: no   No notable events documented.  Last Vitals:  Vitals:   04/09/22 1117 04/09/22 1145  BP: 117/71 137/87  Pulse: 62 67  Resp: 13 16  Temp: 36.8 C   SpO2: 100% 94%    Last Pain:  Vitals:   04/09/22 1145  TempSrc:   PainSc: [place]

## 2022-04-09 NOTE — Plan of Care (Signed)
  Problem: Pain Management: Goal: Pain level will decrease with appropriate interventions Outcome: Progressing   

## 2022-04-09 NOTE — Anesthesia Preprocedure Evaluation (Signed)
Anesthesia Evaluation  Patient identified by MRN, date of birth, ID band Patient awake    Reviewed: Allergy & Precautions, NPO status , Patient's Chart, lab work & pertinent test results  History of Anesthesia Complications (+) PONV and history of anesthetic complications  Airway Mallampati: I  TM Distance: >3 FB Neck ROM: Full    Dental no notable dental hx.    Pulmonary sleep apnea , COPD, Current Smoker and Patient abstained from smoking.,    Pulmonary exam normal breath sounds clear to auscultation       Cardiovascular hypertension, Pt. on medications + CAD  Normal cardiovascular exam Rhythm:Regular Rate:Normal     Neuro/Psych  Headaches, Anxiety Depression    GI/Hepatic GERD  ,  Endo/Other    Renal/GU      Musculoskeletal  (+) Arthritis , Osteoarthritis,    Abdominal   Peds  Hematology   Anesthesia Other Findings   Reproductive/Obstetrics                             Anesthesia Physical  Anesthesia Plan  ASA: 3  Anesthesia Plan: Spinal and Regional   Post-op Pain Management: Regional block*   Induction: Intravenous  PONV Risk Score and Plan: 2 and Ondansetron, Midazolam and Treatment may vary due to age or medical condition  Airway Management Planned: Simple Face Mask  Additional Equipment:   Intra-op Plan:   Post-operative Plan:   Informed Consent: I have reviewed the patients History and Physical, chart, labs and discussed the procedure including the risks, benefits and alternatives for the proposed anesthesia with the patient or authorized representative who has indicated his/her understanding and acceptance.       Plan Discussed with: CRNA and Surgeon  Anesthesia Plan Comments:         Anesthesia Quick Evaluation

## 2022-04-09 NOTE — Transfer of Care (Signed)
Immediate Anesthesia Transfer of Care Note  Patient: [name]  Procedure(s) Performed: TOTAL KNEE ARTHROPLASTY (Right: Knee)  Patient Location: PACU  Anesthesia Type:Spinal and MAC combined with regional for post-op pain  Level of Consciousness: awake, alert  and patient cooperative  Airway & Oxygen Therapy: Patient Spontanous Breathing and Patient connected to face mask oxygen  Post-op Assessment: Report given to RN and Post -op Vital signs reviewed and stable  Post vital signs: Reviewed and stable  Last Vitals:  Vitals Value Taken Time  BP    Temp    Pulse 51 04/09/22 1117  Resp 18 04/09/22 1117  SpO2 99 % 04/09/22 1117  Vitals shown include unvalidated device data.  Last Pain:  Vitals:   04/09/22 0644  TempSrc:   PainSc: 4          Complications: No notable events documented.

## 2022-04-09 NOTE — Evaluation (Signed)
Physical Therapy Evaluation Patient Details Name: [name] MRN: [id number] DOB: 30-Aug-1955 Today's Date: 04/09/2022  History of Present Illness  66 YO female S/P RTKA on 04/09/22, PMH: neck  surgery, HTN, migraines  Clinical Impression  The patient  is feeling  nausea  but able to ambulate x 60'.  Patient should progress to Dc home . Pt admitted with above diagnosis.   Pt currently with functional limitations due to the deficits listed below (see PT Problem List). Pt will benefit from skilled PT to increase their independence and safety with mobility to allow discharge to the venue listed below.          Recommendations for follow up therapy are one component of a multi-disciplinary discharge planning process, led by the attending physician.  Recommendations may be updated based on patient status, additional functional criteria and insurance authorization.  Follow Up Recommendations Follow physician's recommendations for discharge plan and follow up therapies      Assistance Recommended at Discharge Set up Supervision/Assistance  Patient can return home with the following  A little help with walking and/or transfers;A little help with bathing/dressing/bathroom;Help with stairs or ramp for entrance;Assist for transportation    Equipment Recommendations None recommended by PT  Recommendations for Other Services       Functional Status Assessment Patient has had a recent decline in their functional status and demonstrates the ability to make significant improvements in function in a reasonable and predictable amount of time.     Precautions / Restrictions        Mobility  Bed Mobility               General bed mobility comments: seated o  bed edge    Transfers Overall transfer level: Needs assistance Equipment used: Rolling walker (2 wheels) Transfers: Sit to/from Stand Sit to Stand: Min assist           General transfer comment: cues for hand  positiopn    Ambulation/Gait Ambulation/Gait assistance: Min guard Gait Distance (Feet): 60 Feet Assistive device: Rolling walker (2 wheels) Gait Pattern/deviations: Step-to pattern, Step-through pattern       General Gait Details: cues for sequence  Stairs            Wheelchair Mobility    Modified Rankin (Stroke Patients Only)       Balance Overall balance assessment: Mild deficits observed, not formally tested                                           Pertinent Vitals/Pain Pain Assessment Pain Assessment: 0-10 Pain Score: 6  Pain Location: right knee Pain Descriptors / Indicators: Aching, Discomfort Pain Intervention(s): Premedicated before session, Monitored during session, Ice applied    Home Living Family/patient expects to be discharged to:: Private residence Living Arrangements: Spouse/significant other Available Help at Discharge: Available 24 hours/day Type of Home: House Home Access: Stairs to enter   [employer] of Steps: 2   Home Layout: One level Home Equipment: Conservation officer, nature (2 wheels)      Prior Function Prior Level of Function : Independent/Modified Independent                     Hand Dominance        Extremity/Trunk Assessment   Upper Extremity Assessment Upper Extremity Assessment: Overall WFL for tasks assessed    Lower Extremity  Assessment Lower Extremity Assessment: RLE deficits/detail RLE Deficits / Details: able to SLR       Communication   Communication: No difficulties  Cognition Arousal/Alertness: Awake/alert Behavior During Therapy: WFL for tasks assessed/performed Overall Cognitive Status: Within Functional Limits for tasks assessed                                          General Comments      Exercises     Assessment/Plan    PT Assessment Patient needs continued PT services  PT Problem List Decreased strength;Decreased mobility;Decreased  safety awareness;Decreased range of motion;Decreased activity tolerance;Pain       PT Treatment Interventions DME instruction;Therapeutic activities;Gait training;Therapeutic exercise;Patient/family education;Stair training;Functional mobility training    PT Goals (Current goals can be found in the Care Plan section)  Acute Rehab PT Goals Patient Stated Goal: to go home PT Goal Formulation: With patient Time For Goal Achievement: 04/16/22 Potential to Achieve Goals: Good    Frequency 7X/week     Co-evaluation               AM-PAC PT "6 Clicks" Mobility  Outcome Measure Help needed turning from your back to your side while in a flat bed without using bedrails?: A Little Help needed moving from lying on your back to sitting on the side of a flat bed without using bedrails?: A Little Help needed moving to and from a bed to a chair (including a wheelchair)?: A Little Help needed standing up from a chair using your arms (e.g., wheelchair or bedside chair)?: A Little Help needed to walk in hospital room?: A Little Help needed climbing 3-5 steps with a railing? : A Little 6 Click Score: 18    End of Session Equipment Utilized During Treatment: Gait belt;Right knee immobilizer Activity Tolerance: Patient tolerated treatment well Patient left: in chair;with call bell/phone within reach;with chair alarm set;with nursing/sitter in room Nurse Communication: Mobility status PT Visit Diagnosis: Unsteadiness on feet (R26.81);Difficulty in walking, not elsewhere classified (R26.2);Pain Pain - Right/Left: Right Pain - part of body: Knee    Time: 0768-0881 PT Time Calculation (min) (ACUTE ONLY): 23 min   Charges:   PT Evaluation $PT Eval Low Complexity: 1 Low PT Treatments $Gait Training: 8-22 mins        [place] Office [phone] Weekend [phone]   Claretha Cooper 04/09/2022, 4:39 PM

## 2022-04-09 NOTE — Anesthesia Procedure Notes (Signed)
Spinal  Patient location during procedure: OR Start time: 04/09/2022 8:35 AM End time: 04/09/2022 8:37 AM Reason for block: surgical anesthesia Staffing Performed: resident/CRNA  Resident/CRNA: Lind Covert, CRNA Performed by: Lind Covert, CRNA Authorized by: Lynda Rainwater, MD   Preanesthetic Checklist Completed: patient identified, IV checked, site marked, risks and benefits discussed, surgical consent, monitors and equipment checked, pre-op evaluation and timeout performed Spinal Block Patient position: sitting Prep: DuraPrep Patient monitoring: heart rate, cardiac monitor, continuous pulse ox and blood pressure Approach: midline Location: L3-4 Injection technique: single-shot Needle Needle type: Pencan  Needle gauge: 24 G Needle length: 10 cm Needle insertion depth: 7 cm Assessment Sensory level: T6 Events: CSF return Additional Notes Timeout performed. Patient in sitting position. L-3-4 identified. Cleansed with Duraprep. SAB without difficulty. Patient to supine position.

## 2022-04-09 NOTE — Op Note (Unsigned)
NAME: [name], [name] MEDICAL RECORD NO: [id number] ACCOUNT NO: [id number] DATE OF BIRTH: 1955/07/16 FACILITY: [facility] LOCATION: [facility] PHYSICIAN: Johnn Hai, MD  Operative Report   DATE OF PROCEDURE: 04/09/2022  PREOPERATIVE DIAGNOSIS:  End-stage osteoarthrosis, varus deformity, right knee.  POSTOPERATIVE DIAGNOSES:  End-stage osteoarthrosis, varus deformity, right knee.  PROCEDURE PERFORMED:  Right total knee arthroplasty utilizing DePuy Attune rotating platform 6 femur, 5 tibia, 5 mm insert, 35 patella.  ANESTHESIA:  Spinal.  ASSISTANT:  Lacie Draft, PA.  HISTORY:  A 66 year old with end-stage osteoarthrosis, medial compartment, right knee, failing conservative treatment, indicated for replacement of the degenerated joint with negative effect on her activities of daily living.  Risks and benefits  discussed including bleeding, infection, damage to neurovascular structures, no change in symptoms, worsening symptoms, DVT, PE, anesthetic complications, arthrofibrosis, etc.  DESCRIPTION OF PROCEDURE:  With the patient in supine position after induction of adequate spinal anesthesia and 2 grams Kefzol, the right lower extremity was prepped and draped in the usual sterile fashion.  Timeout performed.  Right lower extremity was  exsanguinated. Thigh tourniquet at 225 mmHg.  Midline incision was then made over the knee.  Full thickness flaps developed.  Median parapatellar arthrotomy was performed.  Patella was gently everted, knee was flexed, elevated soft tissues medially,  preserving the MCL.  Grade IV changes were noted in the medial compartment and patellofemoral joint.  Remnants of the medial and lateral menisci were removed.  A notch was placed above the femoral notch with Leksell rongeur with the starting point for  the femoral drill.  We entered the femoral canal in line with the femur.  Irrigated, T-handle placed and then an intramedullary guide 5-degree 10 off the distal  femur was selected, pinned.  We performed our cut.  I then sized off the anterior cortex to a  6.  Three degrees of external rotation.  This was pinned.  I performed anterior, posterior and chamfer cuts.  Soft tissue protected posteriorly at all times, taking care not to notch the femur.  Sclerotic bone was noted.  I subluxed the tibia, the low  side was medially, took 3 off the low side, external alignment guide parallel to the shaft bisecting the tibiotalar joint, 3 degrees slope.  We performed our cut with soft tissue protected posteriorly at all times.  I then harvested bone.  I then checked  our extension and flexion gaps and they were equivalent with a 5 mm insert.  I reflexed the knee, subluxed the tibia, measured to a 5.  This was then pinned maximizing her coverage just the medial aspect of tibial tubercle.  I then harvested bone from  the tibial canal and impacted into the distal femur.  I drilled centrally, placed our thin guide and impacted that into place.  Attention was then turned back towards the femur.  We performed our box cut, bisecting the canal.  I then placed a trial  femur. It was slightly flexed.  I tried removing more sclerotic bone off the anterior femur.  Again, it was flexed with attempted impaction.  I then placed the distal femoral jig and reapplied.  I then reperformed our cuts.  There was additional bone  taken off the anterior cortex with an oscillating saw.  This was then used freehand to remove some of the sclerotic bone more proximal than the blade would allow.  I then reapplied the femur and it fit flush, drilled the lug holes, placed a 5 mm insert,  reduced  the knee, had full extension, full flexion, good stability to varus valgus stressing at 0-30 degrees, negative anterior drawer.  Everted the patella, measured to a 23, planed to a 14.5 using the patellar jig.  I then sized to a 35, medializing  our peg holes.  The paddle parallel to the joint line.  I then drilled our  peg holes, placed the trial patella, reduced it and had excellent patellofemoral tracking.  Next, all trials were removed.  Checked posteriorly and any remnants of menisci were  removed.  Cauterized the geniculates.  Popliteus was intact.  I used pulsatile lavage, thoroughly cleaned the knee.  Knee was then flexed. Patella everted. Tibia subluxed.  All surfaces were thoroughly dried.  I then placed Exparel through the medial  capsule penetrating the capsule.  Aspirated it without breaking the vacuum and then injecting 20 mL in the posterior joint.  I then placed some medially on the lateral compartment and the quadriceps tendon and the periosteum of the femur.  Following  this, all surfaces thoroughly dried.  Cement was mixed on back table under vacuum.  We then cemented the tibial tray with cement on the tray as well as the femur, digitally pressurizing it and prior to that, I drilled holes in sclerotic bone on the  tibia.  The tibial tray was impacted.  Redundant cement removed. I cemented and impacted the femur.  Redundant cement removed.  We placed a 5 trial insert, reduced the knee to full extension and held in axial load throughout the curing of the cement.  We  then cemented and clamped the patella as well.  Cement was placed on the femur and the femoral component.  Redundant cement was removed.  I placed Marcaine with epinephrine into the joint during curing of the cement as was Prontosan and covered the  wound.  After curing of the cement, tourniquet was deflated at 60 minutes.  There was minimal bleeding, which was cauterized.  I had full extension, full flexion, good stability to varus-valgus stressing at 0-30 degrees, negative anterior drawer.  I  selected a 5 insert. Trial was removed.  Flexed the knee and meticulously removed all redundant cement, copiously irrigated with pulsatile lavage within the joint.  Then, with Prontosan and placed a 5 permanent insert, reduced it, it had full extension,   full flexion, and good stability to varus and valgus stress at 0 and 30 degrees, negative anterior drawer.  Following this, I placed the knee in slight flexion, reapproximated patellar arthrotomy with #1 Vicryl in interrupted figure-of-eight sutures  oversewn with a running Stratafix.  I had excellent patellofemoral tracking following this.  Copiously irrigated subcutaneous tissue, which was closed with 2-0 and skin with subcuticular Monocryl.  The remainder of the Exparel was placed into the joint  prior to closure.  Following this, a sterile dressing was applied, immobilizer and transported to the recovery room in satisfactory condition.  She had flexion to gravity at 90 degrees following closure of the skin.  The patient tolerated the procedure  well.  No complications.  assistant, Lacie Draft, PA.  Blood loss 50 mL.      PAA D: 04/09/2022 11:04:35 am T: 04/09/2022 11:33:00 am  JOB: [id number]/ [id number]

## 2022-04-09 NOTE — Brief Op Note (Addendum)
04/09/2022  8:12 AM  PATIENT:  [name]  65 y.o. female  PRE-OPERATIVE DIAGNOSIS:  Right knee degenerative joint disease  POST-OPERATIVE DIAGNOSIS: same  PROCEDURE:  Procedure(s): TOTAL KNEE ARTHROPLASTY (Right)  SURGEON:  Surgeon(s) and Role:    Susa Day, MD - Primary  PHYSICIAN ASSISTANT:   ASSISTANTS: Bissel   ANESTHESIA:   spinal  EBL:  min   BLOOD ADMINISTERED:none  DRAINS: none   LOCAL MEDICATIONS USED:  MARCAINE     SPECIMEN:  No Specimen  DISPOSITION OF SPECIMEN:  N/A  COUNTS:  YES  TOURNIQUET:  60 min                                                                  DICTATION: .Other Dictation: Dictation Number [id number]  PLAN OF CARE: Admit for overnight observation  PATIENT DISPOSITION:  PACU - hemodynamically stable.   Delay start of Pharmacological VTE agent (>24hrs) due to surgical blood loss or risk of bleeding: yes

## 2022-04-10 ENCOUNTER — Encounter (HOSPITAL_COMMUNITY): Payer: Self-pay | Admitting: Specialist

## 2022-04-10 DIAGNOSIS — F17210 Nicotine dependence, cigarettes, uncomplicated: Secondary | ICD-10-CM | POA: Diagnosis not present

## 2022-04-10 DIAGNOSIS — I10 Essential (primary) hypertension: Secondary | ICD-10-CM | POA: Diagnosis not present

## 2022-04-10 DIAGNOSIS — M1711 Unilateral primary osteoarthritis, right knee: Secondary | ICD-10-CM | POA: Diagnosis not present

## 2022-04-10 DIAGNOSIS — M21161 Varus deformity, not elsewhere classified, right knee: Secondary | ICD-10-CM | POA: Diagnosis not present

## 2022-04-10 DIAGNOSIS — J449 Chronic obstructive pulmonary disease, unspecified: Secondary | ICD-10-CM | POA: Diagnosis not present

## 2022-04-10 LAB — BASIC METABOLIC PANEL
Anion gap: 8 (ref 5–15)
BUN: 10 mg/dL (ref 8–23)
CO2: 20 mmol/L — ABNORMAL LOW (ref 22–32)
Calcium: 8.7 mg/dL — ABNORMAL LOW (ref 8.9–10.3)
Chloride: 110 mmol/L (ref 98–111)
Creatinine, Ser: 0.59 mg/dL (ref 0.44–1.00)
GFR, Estimated: >60 mL/min (ref >60)
Glucose, Bld: 147 mg/dL — ABNORMAL HIGH (ref 70–99)
Potassium: 3.8 mmol/L (ref 3.5–5.1)
Sodium: 138 mmol/L (ref 135–145)

## 2022-04-10 MED ORDER — HYDROMORPHONE HCL 2 MG PO TABS: 2.0000 mg | ORAL_TABLET | ORAL | 0 refills | Status: DC | PRN

## 2022-04-10 MED ORDER — KETOROLAC TROMETHAMINE 15 MG/ML IJ SOLN
15.0000 mg | Freq: Four times a day (QID) | INTRAMUSCULAR | Status: DC
  Administered 2022-04-10 – 2022-04-11 (×3): 15 mg via INTRAVENOUS
  Filled 2022-04-10 (×3): qty 1

## 2022-04-10 MED ORDER — POLYETHYLENE GLYCOL 3350 17 G PO PACK: 17.0000 g | PACK | Freq: Every day | ORAL | 0 refills | Status: DC

## 2022-04-10 MED ORDER — TRAMADOL HCL 50 MG PO TABS: 50.0000 mg | ORAL_TABLET | Freq: Four times a day (QID) | ORAL | Status: DC | PRN

## 2022-04-10 MED ORDER — ASPIRIN 81 MG PO TBEC: 81.0000 mg | DELAYED_RELEASE_TABLET | Freq: Two times a day (BID) | ORAL | 1 refills | Status: DC

## 2022-04-10 MED ORDER — TRAMADOL HCL 50 MG PO TABS
100.0000 mg | ORAL_TABLET | Freq: Four times a day (QID) | ORAL | Status: DC | PRN
  Administered 2022-04-10 – 2022-04-11 (×2): 100 mg via ORAL
  Filled 2022-04-10 (×2): qty 2

## 2022-04-10 MED ORDER — SODIUM CHLORIDE 0.9 % IV SOLN
12.5000 mg | Freq: Four times a day (QID) | INTRAVENOUS | Status: DC | PRN
  Filled 2022-04-10: qty 0.5

## 2022-04-10 MED ORDER — DOCUSATE SODIUM 100 MG PO CAPS: 100.0000 mg | ORAL_CAPSULE | Freq: Two times a day (BID) | ORAL | 1 refills | Status: DC | PRN

## 2022-04-10 NOTE — Progress Notes (Signed)
Physical Therapy Treatment Patient Details Name: [name] MRN: [id number] DOB: June 05, 1956 Today's Date: 04/10/2022   History of Present Illness 66 YO female S/P RTKA on 04/09/22, PMH: neck  surgery, HTN, migraines    PT Comments    Patient reporting pain in r knee 7/10, has been mdicated. Patient reports that she did ambulate to BR with assistance, reports nausea is less but not eating.  Patient is unable to progress to reach PT goals for Dc. Continue tomorrow.    Recommendations for follow up therapy are one component of a multi-disciplinary discharge planning process, led by the attending physician.  Recommendations may be updated based on patient status, additional functional criteria and insurance authorization.  Follow Up Recommendations  Follow physician's recommendations for discharge plan and follow up therapies     Assistance Recommended at Discharge Set up Supervision/Assistance  Patient can return home with the following A little help with walking and/or transfers;A little help with bathing/dressing/bathroom;Help with stairs or ramp for entrance;Assist for transportation   Equipment Recommendations  None recommended by PT    Recommendations for Other Services       Precautions / Restrictions Precautions Precautions: Fall;Knee Required Braces or Orthoses: Knee Immobilizer - Right     Mobility  Bed Mobility               General bed mobility comments: deferred    Transfers                        Ambulation/Gait                   Stairs             Wheelchair Mobility    Modified Rankin (Stroke Patients Only)       Balance                                            Cognition Arousal/Alertness: Awake/alert                                              Exercises Total Joint Exercises Ankle Circles/Pumps: AROM, Both, 10 reps Quad Sets: AROM, Right, 10 reps Heel Slides:  AAROM, Right, 5 reps Hip ABduction/ADduction: AROM, Right, 10 reps Straight Leg Raises: AAROM, Right, 10 reps    General Comments        Pertinent Vitals/Pain Pain Assessment Pain Score: 7  Pain Location: right knee Pain Descriptors / Indicators: Aching, Discomfort Pain Intervention(s): Limited activity within patient's tolerance, Monitored during session, Premedicated before session, Relaxation, Ice applied    Home Living                          Prior Function            PT Goals (current goals can now be found in the care plan section) Progress towards PT goals: Not progressing toward goals - comment (pain and nausea)    Frequency    7X/week      PT Plan Current plan remains appropriate    Co-evaluation              AM-PAC PT "6 Clicks" Mobility   Outcome Measure  Help needed turning from your back to your side while in a flat bed without using bedrails?: A Little Help needed moving from lying on your back to sitting on the side of a flat bed without using bedrails?: A Little Help needed moving to and from a bed to a chair (including a wheelchair)?: A Little Help needed standing up from a chair using your arms (e.g., wheelchair or bedside chair)?: A Little Help needed to walk in hospital room?: A Little Help needed climbing 3-5 steps with a railing? : A Lot 6 Click Score: 17    End of Session   Activity Tolerance: Patient limited by pain Patient left: in bed;with call bell/phone within reach;with bed alarm set Nurse Communication: Mobility status PT Visit Diagnosis: Unsteadiness on feet (R26.81);Difficulty in walking, not elsewhere classified (R26.2);Pain Pain - Right/Left: Right Pain - part of body: Knee     Time: 2330-0762 PT Time Calculation (min) (ACUTE ONLY): 26 min  Charges:  $Therapeutic Exercise: 23-37 mins                     [place] Office [phone] Weekend  [phone]    Claretha Cooper 04/10/2022, 4:22 PM

## 2022-04-10 NOTE — Progress Notes (Signed)
   Subjective: 1 Day Post-Op Procedure(s) (LRB): TOTAL KNEE ARTHROPLASTY (Right) Patient reports pain as 4 on 0-10 scale.   Patient seen in rounds with Dr. Wynelle Link. Patient is well, and has had no acute complaints or problems We will start therapy today.  Plan is to go Home after hospital stay.  Objective: Vital signs in last 24 hours: Temp:  [97.8 F (36.6 C)-99.1 F (37.3 C)] 99.1 F (37.3 C) (10/05 0204) Pulse Rate:  [45-84] 57 (10/05 0204) Resp:  [12-19] 17 (10/05 0204) BP: (117-168)/(71-98) 130/77 (10/05 0204) SpO2:  [94 %-100 %] 99 % (10/05 0204) Weight:  [61.7 kg] 61.7 kg (10/04 1357)  Intake/Output from previous day:  Intake/Output Summary (Last 24 hours) at 04/10/2022 0733 Last data filed at 04/10/2022 0553 Gross per 24 hour  Intake 2036.35 ml  Output 2125 ml  Net -88.65 ml    Intake/Output this shift: No intake/output data recorded.  Labs: No results for input(s): "HGB" in the last 72 hours. No results for input(s): "WBC", "RBC", "HCT", "PLT" in the last 72 hours. Recent Labs    04/10/22 0341  NA 138  K 3.8  CL 110  CO2 20*  BUN 10  CREATININE 0.59  GLUCOSE 147*  CALCIUM 8.7*   No results for input(s): "LABPT", "INR" in the last 72 hours.  EXAM General - Patient is Alert Extremity - Neurologically intact ABD soft Neurovascular intact Sensation intact distally Intact pulses distally Dorsiflexion/Plantar flexion intact Incision: dressing C/D/I Compartment soft Dressing - dressing C/D/I Motor Function - intact, moving foot and toes well on exam. No dvt Hemovac pulled without difficulty.  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  Minimum saturation 79%-Dr. Elenore Rota   Osteoarthritis    generalized   PONV (postoperative nausea and vomiting)    Pre-diabetes    Improved with weigth loss   Prediabetes    Tobacco dependence    Vision abnormalities     Assessment/Plan: 1 Day Post-Op Procedure(s) (LRB): TOTAL KNEE ARTHROPLASTY (Right) Principal Problem:   Right knee DJD  Estimated body mass index is 24.87 kg/m as calculated from the following:   Height as of this encounter: '5\' 2"'$  (1.575 m).   Weight as of this encounter: 61.7 kg. Advance diet Up with therapy Discharge home with home health  DVT Prophylaxis - Aspirin Weight-Bearing as tolerated to right  leg D/C O2 and Pulse OX and try on Room Air  Arlee Muslim, PA-C Orthopaedic Surgery 04/10/2022, 7:33 AM

## 2022-04-10 NOTE — Care Plan (Signed)
Ortho Bundle Case Management Note  Patient Details  Name: [name] MRN: [id number] Date of Birth: [birth year]  R TKA on 04-09-22 DCP:  Home with wife DME:  No needs, has a RW PT:  Galen Daft on 04-14-22                   DME Arranged:  N/A DME Agency:  NA  HH Arranged:  NA [place] Agency:  NA  Additional Comments: Please contact me with any questions of if this plan should need to change.  Marianne Sofia, RN,CCM [facility]  [phone] 04/10/2022, 8:50 AM

## 2022-04-10 NOTE — Progress Notes (Signed)
PT Cancellation Note  Patient Details Name: [name] MRN: [id number] DOB: August 01, 1955   Cancelled Treatment:    Reason Eval/Treat Not Completed: Medical issues which prohibited therapy. Patient having nausea following medication. Will check back later when able to progress activity. [place] Office [phone] Weekend [phone]     Claretha Cooper 04/10/2022, 9:48 AM

## 2022-04-10 NOTE — TOC Transition Note (Signed)
Transition of Care [facility]) - CM/SW Discharge Note   Patient Details  Name: [name] MRN: [id number] Date of Birth: Mar 24, 1956  [facility] of [facility] CM/SW Contact:  Lennart Pall, LCSW Phone Number: 04/10/2022, 11:33 AM   Clinical Narrative:    Met with and confirming she has needed DME.  OPPT already arranged with [facility].  No TOC needs.   Final next level of care: OP Rehab Barriers to Discharge: No Barriers Identified   Patient Goals and CMS Choice Patient states their goals for this hospitalization and ongoing recovery are:: return home      Discharge Placement                       Discharge Plan and Services                DME Arranged: N/A DME Agency: NA       HH Arranged: NA HH Agency: NA        Social Determinants of Health (SDOH) Interventions Food Insecurity Interventions: Intervention Not Indicated Housing Interventions: Intervention Not Indicated Transportation Interventions: Intervention Not Indicated Utilities Interventions: Intervention Not Indicated   Readmission Risk Interventions     No data to display

## 2022-04-11 DIAGNOSIS — M1711 Unilateral primary osteoarthritis, right knee: Secondary | ICD-10-CM | POA: Diagnosis not present

## 2022-04-11 DIAGNOSIS — M21161 Varus deformity, not elsewhere classified, right knee: Secondary | ICD-10-CM | POA: Diagnosis not present

## 2022-04-11 DIAGNOSIS — J449 Chronic obstructive pulmonary disease, unspecified: Secondary | ICD-10-CM | POA: Diagnosis not present

## 2022-04-11 DIAGNOSIS — F17210 Nicotine dependence, cigarettes, uncomplicated: Secondary | ICD-10-CM | POA: Diagnosis not present

## 2022-04-11 DIAGNOSIS — I10 Essential (primary) hypertension: Secondary | ICD-10-CM | POA: Diagnosis not present

## 2022-04-11 MED ORDER — TRAMADOL HCL 50 MG PO TABS: 50.0000 mg | ORAL_TABLET | Freq: Four times a day (QID) | ORAL | 0 refills | Status: DC | PRN

## 2022-04-11 MED ORDER — ONDANSETRON HCL 4 MG PO TABS: 4.0000 mg | ORAL_TABLET | Freq: Four times a day (QID) | ORAL | 0 refills | Status: DC | PRN

## 2022-04-11 NOTE — Plan of Care (Signed)
  Problem: Education: Goal: Knowledge of the prescribed therapeutic regimen will improve Outcome: Progressing   Problem: Activity: Goal: Range of joint motion will improve Outcome: Progressing   Problem: Pain Management: Goal: Pain level will decrease with appropriate interventions Outcome: Progressing   Problem: Safety: Goal: Ability to remain free from injury will improve Outcome: Progressing   

## 2022-04-11 NOTE — Progress Notes (Signed)
Physical Therapy Treatment Patient Details Name: [name] MRN: [id number] DOB: Dec 17, 1955 Today's Date: 04/11/2022   History of Present Illness 66 YO female S/P RTKA on 04/09/22, PMH: neck  surgery, HTN, migraines    PT Comments    Patient reports pain better controlled and has been able to eat. Patient has met PT goals for safe Dc home.    Recommendations for follow up therapy are one component of a multi-disciplinary discharge planning process, led by the attending physician.  Recommendations may be updated based on patient status, additional functional criteria and insurance authorization.  Follow Up Recommendations  Follow physician's recommendations for discharge plan and follow up therapies     Assistance Recommended at Discharge Set up Supervision/Assistance  Patient can return home with the following A little help with walking and/or transfers;A little help with bathing/dressing/bathroom;Help with stairs or ramp for entrance;Assist for transportation   Equipment Recommendations  None recommended by PT    Recommendations for Other Services       Precautions / Restrictions Precautions Precautions: Fall;Knee     Mobility  Bed Mobility Overal bed mobility: Needs Assistance Bed Mobility: Supine to Sit, Sit to Supine     Supine to sit: Supervision Sit to supine: Supervision        Transfers Overall transfer level: Needs assistance Equipment used: Rolling walker (2 wheels) Transfers: Sit to/from Stand Sit to Stand: Supervision           General transfer comment: cues for hand positiopn    Ambulation/Gait Ambulation/Gait assistance: Min guard Gait Distance (Feet): 60 Feet (20') Assistive device: Rolling walker (2 wheels) Gait Pattern/deviations: Step-to pattern, Decreased stance time - right, Decreased step length - right, Antalgic       General Gait Details: patient  tolerated ambu;ation and steps   Stairs Stairs: Yes Stairs assistance:  Min guard Stair Management: One rail Right, Step to pattern, With cane   General stair comments: practiced x 2 up/down steps with min guard and cues   Wheelchair Mobility    Modified Rankin (Stroke Patients Only)       Balance Overall balance assessment: Mild deficits observed, not formally tested                                          Cognition Arousal/Alertness: Awake/alert Behavior During Therapy: [facility] for tasks assessed/performed                                            Exercises Total Joint Exercises Quad Sets: AROM, 10 reps, Both Heel Slides: AAROM, Right, 10 reps Hip ABduction/ADduction: AROM, Right, 10 reps Straight Leg Raises: AAROM, Right, 10 reps Long Arc Quad: AROM, Right, 10 reps Knee Flexion: AROM, Right, 10 reps Goniometric ROM: 10-50 right    General Comments        Pertinent Vitals/Pain Pain Assessment Pain Score: 4  Pain Location: right knee Pain Descriptors / Indicators: Aching, Discomfort Pain Intervention(s): Monitored during session, Premedicated before session    Home Living                          Prior Function            PT Goals (current goals can now be found  in the care plan section) Progress towards PT goals: Progressing toward goals    Frequency    7X/week      PT Plan Current plan remains appropriate    Co-evaluation              AM-PAC PT "6 Clicks" Mobility   Outcome Measure  Help needed turning from your back to your side while in a flat bed without using bedrails?: A Little Help needed moving from lying on your back to sitting on the side of a flat bed without using bedrails?: None Help needed moving to and from a bed to a chair (including a wheelchair)?: None Help needed standing up from a chair using your arms (e.g., wheelchair or bedside chair)?: A Little Help needed to walk in hospital room?: A Little Help needed climbing 3-5 steps with a railing? :  A Little 6 Click Score: 20    End of Session Equipment Utilized During Treatment: Gait belt Activity Tolerance: Patient tolerated treatment well Patient left: in bed;with call bell/phone within reach Nurse Communication: Mobility status PT Visit Diagnosis: Unsteadiness on feet (R26.81);Difficulty in walking, not elsewhere classified (R26.2);Pain Pain - Right/Left: Right Pain - part of body: Knee     Time: 1009-1047 PT Time Calculation (min) (ACUTE ONLY): 38 min  Charges:  $Gait Training: 8-22 mins $Therapeutic Exercise: 8-22 mins $Self Care/Home Management: [place] Office [phone] Weekend pager-[phone]    Claretha Cooper 04/11/2022, 1:14 PM

## 2022-04-11 NOTE — Progress Notes (Signed)
Subjective: 2 Days Post-Op Procedure(s) (LRB): TOTAL KNEE ARTHROPLASTY (Right) Patient reports pain as mild and moderate.   Nausea improved. Pain control better. No other c/o.  Objective: Vital signs in last 24 hours: Temp:  [98.6 F (37 C)-98.9 F (37.2 C)] 98.9 F (37.2 C) (10/06 0545) Pulse Rate:  [58-66] 66 (10/06 0545) Resp:  [17-19] 17 (10/06 0545) BP: (152-172)/(73-81) 152/81 (10/06 0545) SpO2:  [95 %-100 %] 96 % (10/06 0545)  Intake/Output from previous day: 10/05 0701 - 10/06 0700 In: 1080 [P.O.:1080] Out: 400 [Urine:400] Intake/Output this shift: No intake/output data recorded.  No results for input(s): "HGB" in the last 72 hours. No results for input(s): "WBC", "RBC", "HCT", "PLT" in the last 72 hours. Recent Labs    04/10/22 0341  NA 138  K 3.8  CL 110  CO2 20*  BUN 10  CREATININE 0.59  GLUCOSE 147*  CALCIUM 8.7*   No results for input(s): "LABPT", "INR" in the last 72 hours.  Neurologically intact ABD soft Neurovascular intact Sensation intact distally Intact pulses distally Dorsiflexion/Plantar flexion intact Incision: dressing C/D/I and no drainage No cellulitis present Compartment soft No calf pain or sign of DVT   Assessment/Plan: 2 Days Post-Op Procedure(s) (LRB): TOTAL KNEE ARTHROPLASTY (Right) Advance diet Up with therapy D/C IV fluids ASA for DVT ppx Dilaudid and Tramadol for pain Antiemetics D/C to home today Outpt PT scheduled   Cecilie Kicks 04/11/2022, 12:11 PM

## 2022-04-11 NOTE — Discharge Summary (Signed)
Physician Discharge Summary   Patient ID: [name] MRN: [id number] DOB/AGE: 1956/06/23 66 y.o.  Admit date: 04/09/2022 Discharge date: 04/11/22  Primary Diagnosis: Right knee primary osteoarthritis  Admission Diagnoses:  Past Medical History:  Diagnosis Date   Anxiety    Aortic atherosclerosis ([place])    Seen on imaging studies   Cervical disc disease    Complication of anesthesia    prolonged sedation   COPD (chronic obstructive pulmonary disease) with emphysema (HCC)    Family history of adverse reaction to anesthesia    Mother N&V   GERD (gastroesophageal reflux disease)    HTN (hypertension)    Hyperlipidemia    Lumbar herniated disc 07/07/2021   MDD (major depressive disorder), recurrent, in partial remission (HCC)    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  Minimum saturation 79%-Dr. Elenore Rota   Osteoarthritis    generalized   PONV (postoperative nausea and vomiting)    Pre-diabetes    Improved with weigth loss   Prediabetes    Tobacco dependence    Vision abnormalities    Discharge Diagnoses:   Principal Problem:   Right knee DJD  Estimated body mass index is 24.87 kg/m as calculated from the following:   Height as of this encounter: '5\' 2"'$  (1.575 m).   Weight as of this encounter: 61.7 kg.  Procedure:  Procedure(s) (LRB): TOTAL KNEE ARTHROPLASTY (Right)   Consults: None  HPI: see H&P Laboratory Data: Admission on 04/09/2022  Component Date Value Ref Range Status   Sodium 04/10/2022 138  135 - 145 mmol/L Final   Potassium 04/10/2022 3.8  3.5 - 5.1 mmol/L Final   Chloride 04/10/2022 110  98 - 111 mmol/L Final   CO2 04/10/2022 20 (L)  22 - 32 mmol/L Final   Glucose, Bld 04/10/2022 147 (H)  70 - 99 mg/dL Final   Glucose reference range applies only to samples taken  after fasting for at least 8 hours.   BUN 04/10/2022 10  8 - 23 mg/dL Final   Creatinine, Ser 04/10/2022 0.59  0.44 - 1.00 mg/dL Final   Calcium 04/10/2022 8.7 (L)  8.9 - 10.3 mg/dL Final   GFR, Estimated 04/10/2022 >60  >60 mL/min Final   Comment: (NOTE) Calculated using the CKD-EPI Creatinine Equation (2021)    Anion gap 04/10/2022 8  5 - 15 Final   Performed at [facility], [place] [address] [place], [place] [address]  Hospital Outpatient Visit on 03/27/2022  Component Date Value Ref Range Status   MRSA, PCR 03/27/2022 NEGATIVE  NEGATIVE Final   Staphylococcus aureus 03/27/2022 POSITIVE (A)  NEGATIVE Final   Comment: (NOTE) The Xpert SA Assay (FDA approved for NASAL specimens in patients 79 years of age and older), is one component of a comprehensive surveillance program. It is not intended to diagnose infection nor to guide or monitor treatment. Performed at [facility], [place] [address], [place], [place] [address]    Hgb A1c MFr Bld 03/27/2022 5.8 (H)  4.8 - 5.6 % Final   Comment: (NOTE) Pre diabetes:          5.7%-6.4%  Diabetes:              >6.4%  Glycemic control for   <7.0% adults with diabetes    Mean Plasma Glucose  03/27/2022 119.76  mg/dL Final   Performed at [place] [address], [place], [place] [address]   WBC 03/27/2022 10.6 (H)  4.0 - 10.5 K/uL Final   RBC 03/27/2022 4.70  3.87 - 5.11 MIL/uL Final   Hemoglobin 03/27/2022 13.7  12.0 - 15.0 g/dL Final   HCT 03/27/2022 43.7  36.0 - 46.0 % Final   MCV 03/27/2022 93.0  80.0 - 100.0 fL Final   MCH 03/27/2022 29.1  26.0 - 34.0 pg Final   MCHC 03/27/2022 31.4  30.0 - 36.0 g/dL Final   RDW 03/27/2022 13.3  11.5 - 15.5 % Final   Platelets 03/27/2022 342  150 - 400 K/uL Final   nRBC 03/27/2022 0.0  0.0 - 0.2 % Final   Performed at [facility], [place] [address] [place], [place] [address]   Sodium 03/27/2022 141  135 - 145 mmol/L Final   Potassium  03/27/2022 4.9  3.5 - 5.1 mmol/L Final   Chloride 03/27/2022 114 (H)  98 - 111 mmol/L Final   CO2 03/27/2022 22  22 - 32 mmol/L Final   Glucose, Bld 03/27/2022 102 (H)  70 - 99 mg/dL Final   Glucose reference range applies only to samples taken after fasting for at least 8 hours.   BUN 03/27/2022 15  8 - 23 mg/dL Final   Creatinine, Ser 03/27/2022 1.14 (H)  0.44 - 1.00 mg/dL Final   Calcium 03/27/2022 9.5  8.9 - 10.3 mg/dL Final   GFR, Estimated 03/27/2022 53 (L)  >60 mL/min Final   Comment: (NOTE) Calculated using the CKD-EPI Creatinine Equation (2021)    Anion gap 03/27/2022 5  5 - 15 Final   Performed at [facility], [place] [address], [place], [place] [address]   Glucose-Capillary 03/27/2022 98  70 - 99 mg/dL Final   Glucose reference range applies only to samples taken after fasting for at least 8 hours.     X-Rays:DG Knee 1-2 Views Right  Result Date: 04/09/2022 CLINICAL DATA:  Status post total right knee arthroplasty. EXAM: RIGHT KNEE - 1-2 VIEW COMPARISON:  None Available. FINDINGS: Status post total right knee arthroplasty. No perihardware lucency is seen to indicate hardware failure or loosening. Postoperative intra-articular air and mild fluid. Mild anterior greater than posterior subcutaneous soft tissue air and swelling. No acute fracture or dislocation. IMPRESSION: Status post total right knee arthroplasty without evidence of hardware failure. Electronically Signed   By: Yvonne Kendall M.D.   On: 04/09/2022 12:03   MM 3D SCREEN BREAST BILATERAL  Result Date: 03/27/2022 CLINICAL DATA:  Screening. EXAM: DIGITAL SCREENING BILATERAL MAMMOGRAM WITH TOMOSYNTHESIS AND CAD TECHNIQUE: Bilateral screening digital craniocaudal and mediolateral oblique mammograms were obtained. Bilateral screening digital breast tomosynthesis was performed. The images were evaluated with computer-aided detection. COMPARISON:  Previous exam(s). ACR Breast Density Category b: There are  scattered areas of fibroglandular density. FINDINGS: There are no findings suspicious for malignancy. IMPRESSION: No mammographic evidence of malignancy. A result letter of this screening mammogram will be mailed directly to the patient. RECOMMENDATION: Screening mammogram in one year. (Code:SM-B-01Y) BI-RADS CATEGORY  1: Negative. Electronically Signed   By: Lajean Manes M.D.   On: 03/27/2022 10:45   DG BONE DENSITY (DXA)  Result Date: 03/26/2022 EXAM: DUAL X-RAY ABSORPTIOMETRY (DXA) FOR BONE MINERAL DENSITY IMPRESSION: Referring Physician:  [place] Your patient completed a bone mineral density test using GE Lunar iDXA system (analysis version: 16). Technologist: [place] PATIENT: Name: [name], [name]  Patient ID:  [id number] Birth Date: [birth year] Height:     62.0 in. Sex:         Female Measured:   03/26/2022 Weight:     138.0 lbs. Indications: Caucasian, Estrogen Deficient, Hysterectomy, Postmenopausal, Tobacco User (Current Smoker) Fractures: None Treatments: Vitamin D (E933.5) ASSESSMENT: The BMD measured at Forearm Radius 33% is 0.675 g/cm2 with a T-score of -2.3. This patient is considered osteopenic/low bone mass according to [place] [facility]) criteria. The quality of the exam is good. Site Region Measured Date Measured Age YA BMD Significant CHANGE T-score Left Forearm Radius 33% 2/26/2021 67.5 -2.3 0.675 g/cm2 AP Spine L1-L4 2/26/2021 67.5 0.7 1.288 g/cm2 * AP Spine L1-L4 09/05/2016 61.9 1.6 1.407 g/cm2 DualFemur Neck Right 03/26/2022 67.5 -1.3 0.857 g/cm2 * DualFemur Neck Right 09/05/2016 61.9 -0.6 0.939 g/cm2 DualFemur Total Mean 03/26/2022 67.5 -0.9 0.900 g/cm2 * DualFemur Total Mean 09/05/2016 61.9 0.2 1.034 g/cm2 World Health Organization [facility]) criteria for post-menopausal, Caucasian Women: Normal       T-score at or above -1 SD Osteopenia   T-score between -1 and -2.5 SD Osteoporosis T-score at or below -2.5 SD RECOMMENDATION: 1. All patients should optimize calcium and  vitamin D intake. 2. Consider FDA-approved medical therapies in postmenopausal women and men aged 55 years and older, based on the following: a. A hip or vertebral (clinical or morphometric) fracture. b. T-score = -2.5 at the femoral neck or spine after appropriate evaluation to exclude secondary causes. c. Low bone mass (T-score between -1.0 and -2.5 at the femoral neck or spine) and a 10-year probability of a hip fracture = 3% or a 10-year probability of a major osteoporosis-related fracture = 20% based on the [place]-adapted WHO algorithm. d. Clinician judgment and/or patient preferences may indicate treatment for people with 10-year fracture probabilities above or below these levels. FOLLOW-UP: Patients with diagnosis of osteoporosis or at high risk for fracture should have regular bone mineral density tests. Patients eligible for Medicare are allowed routine testing every 2 years. The testing frequency can be increased to one year for patients who have rapidly progressing disease, are receiving or discontinuing medical therapy to restore bone mass, or have additional risk factors. I have reviewed this study and agree with the findings. [facility] FRAX* 10-year Probability of Fracture Based on femoral neck BMD: DualFemur (Right) Major Osteoporotic Fracture: 9.3% Hip Fracture:                1.7% Population:                  [place] (Caucasian) Risk Factors:                Tobacco User (Current Smoker) *FRAX is a Materials engineer of the [employer] for Metabolic Bone Disease, a World Pharmacologist (WHO) [facility]. ASSESSMENT: The probability of a major osteoporotic fracture is 9.3 % within the next ten years. The probability of a hip fracture is 1.7 % within the next ten years. Electronically Signed   By: Ammie Ferrier M.D.   On: 03/26/2022 13:17    EKG: Orders placed or performed in visit on 04/24/21   EKG 12-Lead     Hospital Course: [name] is a 66 y.o. who was admitted to [facility]. They were brought to the operating room on 04/09/2022 and underwent Procedure(s): TOTAL KNEE ARTHROPLASTY.  Patient tolerated the procedure well and was later transferred to the recovery room and then  to the orthopaedic floor for postoperative care.  They were given PO and IV analgesics for pain control following their surgery.  They were given 24 hours of postoperative antibiotics of  Anti-infectives (From admission, onward)    Start     Dose/Rate Route Frequency Ordered Stop   04/09/22 1445  ceFAZolin (ANCEF) IVPB 1 g/50 mL premix        1 g 100 mL/hr over 30 Minutes Intravenous Every 6 hours 04/09/22 1355 04/09/22 2030   04/09/22 0630  ceFAZolin (ANCEF) IVPB 2g/100 mL premix        2 g 200 mL/hr over 30 Minutes Intravenous On call to O.R. 04/09/22 4098 04/09/22 0837      and started on DVT prophylaxis in the form of Aspirin, TED hose, and SCDs .   PT and OT were ordered for total joint protocol.  Discharge planning consulted to help with postop disposition and equipment needs.  Patient had a difficult night on the evening of surgery.  They started to get up OOB with therapy on day one. ontinued to work with therapy into day two. By day two, the patient had progressed with therapy and meeting their goals.  Incision was healing well.  Patient was seen in rounds and was ready to go home.   Diet: Regular diet Activity:WBAT Follow-up:in 10-14 days Disposition - Home Discharged Condition: good   Discharge Instructions     Call MD / Call 911   Complete by: As directed    If you experience chest pain or shortness of breath, CALL 911 and be transported to the hospital emergency room.  If you develope a fever above 101 F, pus (white drainage) or increased drainage or redness at the wound, or calf pain, call your surgeon's office.   Constipation Prevention   Complete by: As directed    Drink plenty of fluids.  Prune juice may be  helpful.  You may use a stool softener, such as Colace (over the counter) 100 mg twice a day.  Use MiraLax (over the counter) for constipation as needed.   Diet - low sodium heart healthy   Complete by: As directed    Increase activity slowly as tolerated   Complete by: As directed    Post-operative opioid taper instructions:   Complete by: As directed    POST-OPERATIVE OPIOID TAPER INSTRUCTIONS: It is important to wean off of your opioid medication as soon as possible. If you do not need pain medication after your surgery it is ok to stop day one. Opioids include: Codeine, Hydrocodone(Norco, Vicodin), Oxycodone(Percocet, oxycontin) and hydromorphone amongst others.  Long term and even short term use of opiods can cause: Increased pain response Dependence Constipation Depression Respiratory depression And more.  Withdrawal symptoms can include Flu like symptoms Nausea, vomiting And more Techniques to manage these symptoms Hydrate well Eat regular healthy meals Stay active Use relaxation techniques(deep breathing, meditating, yoga) Do Not substitute Alcohol to help with tapering If you have been on opioids for less than two weeks and do not have pain than it is ok to stop all together.  Plan to wean off of opioids This plan should start within one week post op of your joint replacement. Maintain the same interval or time between taking each dose and first decrease the dose.  Cut the total daily intake of opioids by one tablet each day Next start to increase the time between doses. The last dose that should be eliminated is the evening dose.  Allergies as of 04/11/2022       Reactions   Aripiprazole Other (See Comments)   Felt hyper   Fluoxetine Other (See Comments)   Ineffective   Hydrocodone Other (See Comments)   Headache   Percocet [oxycodone-acetaminophen] Other (See Comments)   Causes headaches        Medication List     STOP taking these  medications    atorvastatin 20 MG tablet Commonly known as: LIPITOR   Coenzyme Q10 300 MG Caps   omega-3 acid ethyl esters 1 g capsule Commonly known as: LOVAZA       TAKE these medications    acetaminophen 500 MG tablet Commonly known as: TYLENOL Take 1,000 mg by mouth daily as needed for mild pain.   acetaZOLAMIDE 250 MG tablet Commonly known as: DIAMOX Take 1 tablet twice daily   ALPRAZolam 0.5 MG tablet Commonly known as: XANAX Take 0.25 mg by mouth at bedtime.   aspirin EC 81 MG tablet Take 1 tablet (81 mg total) by mouth 2 (two) times daily after a meal. Day after surgery   desvenlafaxine 100 MG 24 hr tablet Commonly known as: PRISTIQ Take 100 mg by mouth daily.   docusate sodium 100 MG capsule Commonly known as: Colace Take 1 capsule (100 mg total) by mouth 2 (two) times daily as needed for mild constipation.   docusate sodium 100 MG capsule Commonly known as: Colace Take 1 capsule (100 mg total) by mouth 2 (two) times daily as needed for mild constipation.   HYDROmorphone 2 MG tablet Commonly known as: Dilaudid Take 1 tablet (2 mg total) by mouth every 4 (four) hours as needed for severe pain.   ibuprofen 600 MG tablet Commonly known as: ADVIL Take 1 tablet (600 mg total) by mouth every 6 (six) hours as needed (mild pain).   losartan 50 MG tablet Commonly known as: COZAAR Take 25 mg by mouth daily with supper.   magnesium oxide 400 MG tablet Commonly known as: MAG-OX Take 400 mg by mouth at bedtime.   metoprolol succinate 100 MG 24 hr tablet Commonly known as: TOPROL-XL Take 100 mg by mouth daily.   omeprazole 20 MG capsule Commonly known as: PRILOSEC Take 20 mg by mouth every morning.   ondansetron 4 MG tablet Commonly known as: ZOFRAN Take 1 tablet (4 mg total) by mouth every 6 (six) hours as needed for nausea.   polyethylene glycol 17 g packet Commonly known as: MIRALAX / GLYCOLAX Take 17 g by mouth daily.   Rexulti 2 MG Tabs  tablet Generic drug: brexpiprazole Take 2 mg by mouth at bedtime.   traMADol 50 MG tablet Commonly known as: ULTRAM Take 1 tablet (50 mg total) by mouth every 6 (six) hours as needed for moderate pain.   triamcinolone 55 MCG/ACT Aero nasal inhaler Commonly known as: NASACORT Place 2 sprays into the nose daily as needed (Allergies and congestion).   Vitamin D-3 125 MCG (5000 UT) Tabs Take 5,000 Units by mouth daily.        Follow-up Information     Susa Day, MD. Go on 04/23/2022.   Specialty: Orthopedic Surgery Why: You are scheduled for a follow up appointment on 04-23-22 at 1:30 pm. Contact information: [address] [place] [address] [phone]                 Signed: Lacie Draft, PA-C Orthopaedic Surgery 04/11/2022, 12:16 PM

## 2022-04-14 DIAGNOSIS — M25661 Stiffness of right knee, not elsewhere classified: Secondary | ICD-10-CM | POA: Diagnosis not present

## 2022-04-14 DIAGNOSIS — M25561 Pain in right knee: Secondary | ICD-10-CM | POA: Diagnosis not present

## 2022-04-17 DIAGNOSIS — M25561 Pain in right knee: Secondary | ICD-10-CM | POA: Diagnosis not present

## 2022-04-17 DIAGNOSIS — M25661 Stiffness of right knee, not elsewhere classified: Secondary | ICD-10-CM | POA: Diagnosis not present

## 2022-04-21 DIAGNOSIS — M25561 Pain in right knee: Secondary | ICD-10-CM | POA: Diagnosis not present

## 2022-04-21 DIAGNOSIS — M25661 Stiffness of right knee, not elsewhere classified: Secondary | ICD-10-CM | POA: Diagnosis not present

## 2022-04-23 DIAGNOSIS — Z96651 Presence of right artificial knee joint: Secondary | ICD-10-CM | POA: Diagnosis not present

## 2022-04-23 DIAGNOSIS — M25561 Pain in right knee: Secondary | ICD-10-CM | POA: Diagnosis not present

## 2022-04-23 DIAGNOSIS — M25661 Stiffness of right knee, not elsewhere classified: Secondary | ICD-10-CM | POA: Diagnosis not present

## 2022-04-24 ENCOUNTER — Encounter: Payer: Self-pay | Admitting: *Deleted

## 2022-04-26 DIAGNOSIS — N390 Urinary tract infection, site not specified: Secondary | ICD-10-CM | POA: Diagnosis not present

## 2022-04-28 DIAGNOSIS — M25561 Pain in right knee: Secondary | ICD-10-CM | POA: Diagnosis not present

## 2022-04-28 DIAGNOSIS — M25661 Stiffness of right knee, not elsewhere classified: Secondary | ICD-10-CM | POA: Diagnosis not present

## 2022-05-01 DIAGNOSIS — M25661 Stiffness of right knee, not elsewhere classified: Secondary | ICD-10-CM | POA: Diagnosis not present

## 2022-05-01 DIAGNOSIS — M25561 Pain in right knee: Secondary | ICD-10-CM | POA: Diagnosis not present

## 2022-05-02 ENCOUNTER — Other Ambulatory Visit: Payer: Self-pay

## 2022-05-02 DIAGNOSIS — Z122 Encounter for screening for malignant neoplasm of respiratory organs: Secondary | ICD-10-CM

## 2022-05-02 DIAGNOSIS — F17210 Nicotine dependence, cigarettes, uncomplicated: Secondary | ICD-10-CM

## 2022-05-02 DIAGNOSIS — Z87891 Personal history of nicotine dependence: Secondary | ICD-10-CM

## 2022-05-05 DIAGNOSIS — M25561 Pain in right knee: Secondary | ICD-10-CM | POA: Diagnosis not present

## 2022-05-05 DIAGNOSIS — M25661 Stiffness of right knee, not elsewhere classified: Secondary | ICD-10-CM | POA: Diagnosis not present

## 2022-05-05 DIAGNOSIS — G4733 Obstructive sleep apnea (adult) (pediatric): Secondary | ICD-10-CM | POA: Diagnosis not present

## 2022-05-05 DIAGNOSIS — I10 Essential (primary) hypertension: Secondary | ICD-10-CM | POA: Diagnosis not present

## 2022-05-12 DIAGNOSIS — M25661 Stiffness of right knee, not elsewhere classified: Secondary | ICD-10-CM | POA: Diagnosis not present

## 2022-05-12 DIAGNOSIS — M25561 Pain in right knee: Secondary | ICD-10-CM | POA: Diagnosis not present

## 2022-05-15 DIAGNOSIS — M25661 Stiffness of right knee, not elsewhere classified: Secondary | ICD-10-CM | POA: Diagnosis not present

## 2022-05-15 DIAGNOSIS — M25561 Pain in right knee: Secondary | ICD-10-CM | POA: Diagnosis not present

## 2022-05-20 DIAGNOSIS — M5451 Vertebrogenic low back pain: Secondary | ICD-10-CM | POA: Diagnosis not present

## 2022-05-20 DIAGNOSIS — Z96651 Presence of right artificial knee joint: Secondary | ICD-10-CM | POA: Diagnosis not present

## 2022-05-20 DIAGNOSIS — Z471 Aftercare following joint replacement surgery: Secondary | ICD-10-CM | POA: Diagnosis not present

## 2022-05-26 DIAGNOSIS — G4733 Obstructive sleep apnea (adult) (pediatric): Secondary | ICD-10-CM | POA: Diagnosis not present

## 2022-05-27 DIAGNOSIS — M5416 Radiculopathy, lumbar region: Secondary | ICD-10-CM | POA: Diagnosis not present

## 2022-06-05 DIAGNOSIS — G4733 Obstructive sleep apnea (adult) (pediatric): Secondary | ICD-10-CM | POA: Diagnosis not present

## 2022-06-05 DIAGNOSIS — I10 Essential (primary) hypertension: Secondary | ICD-10-CM | POA: Diagnosis not present

## 2022-06-11 DIAGNOSIS — G4733 Obstructive sleep apnea (adult) (pediatric): Secondary | ICD-10-CM | POA: Diagnosis not present

## 2022-06-13 DIAGNOSIS — G4733 Obstructive sleep apnea (adult) (pediatric): Secondary | ICD-10-CM | POA: Diagnosis not present

## 2022-06-19 ENCOUNTER — Ambulatory Visit (HOSPITAL_BASED_OUTPATIENT_CLINIC_OR_DEPARTMENT_OTHER)
Admission: RE | Admit: 2022-06-19 | Discharge: 2022-06-19 | Disposition: A | Discharge status: Home / Self Care | Payer: Medicare Other | Source: Ambulatory Visit | Attending: Family Medicine | Admitting: Family Medicine

## 2022-06-19 DIAGNOSIS — Z122 Encounter for screening for malignant neoplasm of respiratory organs: Secondary | ICD-10-CM | POA: Insufficient documentation

## 2022-06-19 DIAGNOSIS — F17210 Nicotine dependence, cigarettes, uncomplicated: Secondary | ICD-10-CM | POA: Insufficient documentation

## 2022-06-19 DIAGNOSIS — Z87891 Personal history of nicotine dependence: Secondary | ICD-10-CM | POA: Diagnosis not present

## 2022-06-23 ENCOUNTER — Other Ambulatory Visit: Payer: Self-pay

## 2022-06-23 DIAGNOSIS — Z87891 Personal history of nicotine dependence: Secondary | ICD-10-CM

## 2022-06-23 DIAGNOSIS — Z122 Encounter for screening for malignant neoplasm of respiratory organs: Secondary | ICD-10-CM

## 2022-06-23 DIAGNOSIS — F17210 Nicotine dependence, cigarettes, uncomplicated: Secondary | ICD-10-CM

## 2022-07-12 DIAGNOSIS — G4733 Obstructive sleep apnea (adult) (pediatric): Secondary | ICD-10-CM | POA: Diagnosis not present

## 2022-07-15 ENCOUNTER — Ambulatory Visit: Payer: Medicare Other | Attending: Cardiology | Admitting: Cardiology

## 2022-07-15 ENCOUNTER — Encounter: Payer: Self-pay | Admitting: Cardiology

## 2022-07-15 VITALS — BP 128/70 | HR 61 | Ht 62.0 in | Wt 147.0 lb

## 2022-07-15 DIAGNOSIS — E782 Mixed hyperlipidemia: Secondary | ICD-10-CM

## 2022-07-15 DIAGNOSIS — I10 Essential (primary) hypertension: Secondary | ICD-10-CM | POA: Diagnosis not present

## 2022-07-15 DIAGNOSIS — Z716 Tobacco abuse counseling: Secondary | ICD-10-CM

## 2022-07-15 DIAGNOSIS — I341 Nonrheumatic mitral (valve) prolapse: Secondary | ICD-10-CM | POA: Diagnosis not present

## 2022-07-15 DIAGNOSIS — I2584 Coronary atherosclerosis due to calcified coronary lesion: Secondary | ICD-10-CM | POA: Diagnosis not present

## 2022-07-15 DIAGNOSIS — I2510 Atherosclerotic heart disease of native coronary artery without angina pectoris: Secondary | ICD-10-CM

## 2022-07-15 DIAGNOSIS — E785 Hyperlipidemia, unspecified: Secondary | ICD-10-CM | POA: Diagnosis not present

## 2022-07-15 NOTE — Patient Instructions (Signed)
Medication Instructions:  No changes  *If you need a refill on your cardiac medications before your next appointment, please call your pharmacy*   Lab Work:  Not needed   Testing/Procedures:  Not needed  Follow-Up: At [facility], you and your health needs are our priority.  As part of our continuing mission to provide you with exceptional heart care, we have created designated Provider Care Teams.  These Care Teams include your primary Cardiologist (physician) and Advanced Practice Providers (APPs -  Physician Assistants and Nurse Practitioners) who all work together to provide you with the care you need, when you need it.     Your next appointment:   24 month(s)  The format for your next appointment:   In Person  Provider:   Glenetta Hew, MD

## 2022-07-15 NOTE — Progress Notes (Signed)
Primary Care Provider: Harlan Stains, MD [place] Cardiologist: Glenetta Hew, MD Electrophysiologist: None  Clinic Note: Chief Complaint  Patient presents with   Follow-up   ===================================  ASSESSMENT/PLAN   Problem List Items Addressed This Visit       Cardiology Problems   Hyperlipidemia LDL goal <100 (Chronic)    With family history of CAD, reasonable to treat with LDL police down to 956.  But hers is well below that target.  Most recent LDL was 54 which is goal for patients with known CAD.  She is tolerating atorvastatin with no issues, continue current dose.      Relevant Medications   atorvastatin (LIPITOR) 20 MG tablet   Other Relevant Orders   EKG 12-Lead   Mitral valve prolapse (Chronic)    No longer to diagnosis based on up-to-date echo.      Relevant Medications   atorvastatin (LIPITOR) 20 MG tablet   HTN (hypertension) (Chronic)    Blood pressure looks well-controlled on current dose of losartan and Toprol.      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  Remains asymptomatic.  Based on this course, no indication for aspirin.  As far as cardiac risk factors go, lipids are well-controlled on current dose of statin (ended been held following her knee surgery, but she is back on it along with fish oil); she is on losartan and Toprol.  She does have some intermittent palpitation episodes but they do not seem to be bothering her all that much.  If she has a bad day, she could take an extra half tablet of Toprol.      Relevant Medications   atorvastatin (LIPITOR) 20 MG tablet   Other Relevant Orders   EKG 12-Lead     Other   Tobacco abuse counseling (Chronic)    Smoking cessation instruction/counseling given:   counseled patient on the dangers of tobacco use, advised patient to stop smoking, and reviewed strategies to maximize success        ===================================  HPI:    [name] is a 67 y.o. female smoker with a PMH notable for HTN, HLD, COPD, pre-DM-2, OSA and reported MVP along with family history of CAD-MI/CVA/CHF who presents today for annual follow-up at the request of Harlan Stains, MD.  Initial evaluation of Chest Pain and Dyspnea/Palpitations with Echocardiogram and Coronary CTA: TTE 05/16/2021: EF 60 to 65%.  No R WMA.  Normal diastolic numbers.  No MR or MS.  No comment on MVP.  Mild aortic sclerosis with no stenosis.  Normal RAP. Coronary CTA 05/03/2021: Coronary calcium score 0.  No evidence of CAD  [name] was last seen on July 30, 2021 for follow-up after echo and Coronary CTA noted above.  She was working on [employer] with doing exercise and watch her diet.  No chest pain or pressure.  No PND orthopnea or edema.  Just having some exercise intolerance due to knee pain and rare occasional palpitations.-Was doing well.  Plan was 1 year follow-up to reassess.  Recent Hospitalizations:  Total knee arthroplasty 04/09/2022  Reviewed  CV studies:    The following studies were reviewed today: (if available, images/films reviewed: From Epic Chart or Care Everywhere) No new studies:  Interval History:  [name] returns here today for annual follow-up doing pretty well.  She just notes having rare off-and-on episodes where her heart rate goes up for less than a minute or 2.  Does not only bother her all that much.  Usually related to musculoskeletal pain or stress/anxiety.  No recurrent episodes of chest pain or pressure with rest or exertion.  No resting or exertional dyspnea, PND, orthopnea or edema. No sustained arrhythmias.  No syncope or near syncope, TIA/amaurosis fugax or claudication. Gradually recovering from her knee  surgery.  Feeling much better.  REVIEWED OF SYSTEMS   Review of Systems  Constitutional:  Negative for malaise/fatigue and weight loss (Gain weight after being sedentary from the surgery.).  HENT:  Negative for congestion and nosebleeds.   Respiratory:  Negative for cough and sputum production.   Gastrointestinal:  Negative for blood in stool and melena.  Genitourinary:  Negative for hematuria.  Musculoskeletal:  Positive for joint pain (Right knee is still little stiff.).  Neurological:  Negative for dizziness and focal weakness.  Psychiatric/Behavioral: Negative.      I have reviewed and (if needed) personally updated the patient's problem list, medications, allergies, past medical and surgical history, social and family history.   PAST MEDICAL HISTORY   Past Medical History:  Diagnosis Date   Anxiety    Aortic atherosclerosis ([place])    Seen on imaging studies   Cervical disc disease    Complication of anesthesia    prolonged sedation   COPD (chronic obstructive pulmonary disease) with emphysema (HCC)    Family history of adverse reaction to anesthesia    Mother N&V   GERD (gastroesophageal reflux disease)    HTN (hypertension)    Hyperlipidemia    Lumbar herniated disc 07/07/2021   MDD (major depressive disorder), recurrent, in partial remission (HCC)    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  Minimum saturation 79%-Dr. Elenore Rota   Osteoarthritis    generalized   PONV (postoperative nausea and vomiting)    Pre-diabetes    Improved with weigth loss   Prediabetes    Tobacco dependence    Vision abnormalities     PAST SURGICAL HISTORY   Past Surgical History:  Procedure Laterality Date   ANTERIOR AND POSTERIOR REPAIR N/A 10/16/2015   Procedure: ANTERIOR (CYSTOCELE) AND POSTERIOR REPAIR  (RECTOCELE);  Surgeon: Nunzio Cobbs, MD;  Location: [facility];  Service: Gynecology;  Laterality: N/A;   APPENDECTOMY  1974   BLADDER SUSPENSION N/A 10/16/2015   Procedure: TRANSVAGINAL TAPE (TVT) PROCEDURE exact midurethral sling;  Surgeon: Nunzio Cobbs, MD;  Location: [facility];  Service: Gynecology;  Laterality: N/A;   BREAST BIOPSY     negative x 2    BREAST EXCISIONAL BIOPSY Left 1998   BREAST EXCISIONAL BIOPSY Right 1986   BUNIONECTOMY     w/hammer toe repair   CARPAL TUNNEL RELEASE Right    CERVICAL [place] SURGERY  12/2004   CT CTA CORONARY W/CA SCORE W/CM &/OR WO/CM  04/26/2019   Coronary calcium score 0.  No evidence of CAD.   CYSTO N/A 10/16/2015   Procedure: Kathrene Alu;  Surgeon: Nunzio Cobbs, MD;  Location: [facility];  Service: Gynecology;  Laterality: N/A;   CYSTOSCOPY N/A 02/26/2016   Procedure: CYSTOSCOPY;  Surgeon:  Brook Oletta Lamas, MD;  Location: [facility];  Service: Gynecology;  Laterality: N/A;   KNEE ARTHROSCOPY     right knee x 2   MENISCUS REPAIR     right knee x 1   TENS     upper and lower back   TONSILECTOMY, ADENOIDECTOMY, BILATERAL MYRINGOTOMY AND TUBES     TOTAL KNEE ARTHROPLASTY Right 04/09/2022   Procedure: TOTAL KNEE ARTHROPLASTY;  Surgeon: Susa Day, MD;  Location: [facility];  Service: Orthopedics;  Laterality: Right;   TOTAL VAGINAL HYSTERECTOMY  05/2004   adenomyosis, prolapse--ovaries remain   TRANSTHORACIC ECHOCARDIOGRAM  05/17/2019   EF 60 to 65%.  No R WMA.  Normal diastolic numbers.  No MR or MS.  No comment on MVP.  Mild aortic sclerosis with no stenosis.  Normal RAP.   TRANSVAGINAL TAPE (TVT) REMOVAL N/A 02/26/2016   Procedure: Lysis of mid urethral sling, Anterior Colporrhaphy, Cystoscopy ;  Surgeon: Nunzio Cobbs, MD;  Location: [facility];  Service: Gynecology;  Laterality: N/A;  first case/ move first case to follow this. Need 1 hour/    TUBAL LIGATION      Immunization History  Administered Date(s)  Administered   Influenza Inj Mdck Quad Pf 03/18/2018   Influenza,inj,Quad PF,6+ Mos 04/30/2018   Influenza-Unspecified 04/29/2017   Moderna Sars-Covid-2 Vaccination 08/18/2019, 09/15/2019, 05/01/2020   Pneumococcal Polysaccharide-23 09/06/2014   Tdap 11/04/2008, 09/13/2018    MEDICATIONS/ALLERGIES   Current Meds  Medication Sig   acetaminophen (TYLENOL) 500 MG tablet Take 1,000 mg by mouth daily as needed for mild pain.   acetaZOLAMIDE (DIAMOX) 250 MG tablet Take 1 tablet twice daily   ALPRAZolam (XANAX) 0.5 MG tablet Take 0.25 mg by mouth at bedtime.   atorvastatin (LIPITOR) 20 MG tablet Take 20 mg by mouth daily.   brexpiprazole (REXULTI) 2 MG TABS tablet Take 2 mg by mouth at bedtime.   Cholecalciferol (VITAMIN D-3) 5000 UNITS TABS Take 5,000 Units by mouth daily.   desvenlafaxine (PRISTIQ) 100 MG 24 hr tablet Take 100 mg by mouth daily.   ibuprofen (ADVIL,MOTRIN) 600 MG tablet Take 1 tablet (600 mg total) by mouth every 6 (six) hours as needed (mild pain).   losartan (COZAAR) 50 MG tablet Take 25 mg by mouth daily with supper.   magnesium oxide (MAG-OX) 400 MG tablet Take 400 mg by mouth at bedtime.   metoprolol succinate (TOPROL-XL) 100 MG 24 hr tablet Take 100 mg by mouth daily.   omeprazole (PRILOSEC) 20 MG capsule Take 20 mg by mouth every morning.   triamcinolone (NASACORT) 55 MCG/ACT AERO nasal inhaler Place 2 sprays into the nose daily as needed (Allergies and congestion).  - also taking Fish Oil / Omega 3 FA   Allergies  Allergen Reactions   Aripiprazole Other (See Comments)    Felt hyper   Fluoxetine Other (See Comments)    Ineffective   Hydrocodone Other (See Comments)    Headache   Percocet [Oxycodone-Acetaminophen] Other (See Comments)    Causes headaches    SOCIAL HISTORY/FAMILY HISTORY   Reviewed in Epic:  Pertinent findings:  Social History   Tobacco Use   Smoking status: Every Day    Packs/day: 0.25    Years: 48.00    Total pack years: 12.00     Types: Cigarettes   Smokeless tobacco: Never   Tobacco comments:    feb 202 at 1/2 to 3/4 pack cigs per day  Vaping Use   Vaping Use: Every day  Start date: 02/05/2020  Substance Use Topics   Alcohol use: No    Alcohol/week: 0.0 standard drinks of alcohol   Drug use: No   Social History   Social History Narrative   Right handed    Caffeine use: 3 cups per day   Lives with partner: Johny Drilling.   --> 2 kids [place] and [place]; 2 grandchildren   Prescription, but does not go to church.      Has cut down to 7 cigarettes a day from 10-12.  He is using vaping to assist.      Exercise: Tries to go 3 days a week to the [facility].  She does about mile and a half on the treadmill and up 30-minute on machines.  She used to do it more frequently and longer on the treadmill at least 3 miles, but this is been reduced her knee pain.    OBJCTIVE -PE, EKG, labs   Wt Readings from Last 3 Encounters:  07/15/22 147 lb (66.7 kg)  06/19/22 135 lb (61.2 kg)  04/09/22 136 lb (61.7 kg)    Physical Exam: BP 128/70   Pulse 61   Ht '5\' 2"'$  (1.575 m)   Wt 147 lb (66.7 kg)   LMP 03/07/2004 (Approximate)   SpO2 96%   BMI 26.89 kg/m  Physical Exam Vitals reviewed.  Constitutional:      General: She is not in acute distress.    Appearance: Normal appearance. She is normal weight. She is not ill-appearing or toxic-appearing.  HENT:     Head: Normocephalic and atraumatic.  Neck:     Vascular: No carotid bruit.  Cardiovascular:     Rate and Rhythm: Normal rate and regular rhythm.     Pulses: Normal pulses.     Heart sounds: Normal heart sounds. No murmur heard.    No friction rub. No gallop.  Pulmonary:     Effort: Pulmonary effort is normal. No respiratory distress.     Breath sounds: Normal breath sounds. No wheezing, rhonchi or rales.  Chest:     Chest wall: No tenderness.  Musculoskeletal:        General: No swelling. Normal range of motion.     Cervical back: Normal range  of motion and neck supple.  Skin:    General: Skin is warm and dry.  Neurological:     General: No focal deficit present.     Mental Status: She is alert and oriented to person, place, and time.  Psychiatric:        Mood and Affect: Mood normal.        Behavior: Behavior normal.        Thought Content: Thought content normal.        Judgment: Judgment normal.     Adult ECG Report  Rate: 61 ;  Rhythm: normal sinus rhythm and borderline left atrial enlargement, cannot exclude septal MI, age-indeterminate. ;   Narrative Interpretation: Stable compared to October 2022  Recent Labs: Reviewed 10/24/2021: TC 118, TG 122, HDL 41, LDL 54.;  09/26/2021: TSH 1.16. No results found for: "CHOL", "HDL", "LDLCALC", "LDLDIRECT", "TRIG", "CHOLHDL" Lab Results  Component Value Date   CREATININE 0.59 04/10/2022   BUN 10 04/10/2022   NA 138 04/10/2022   K 3.8 04/10/2022   CL 110 04/10/2022   CO2 20 (L) 04/10/2022      Latest Ref Rng & Units 03/27/2022    1:30 PM 07/24/2021   11:18 AM 07/25/2020   10:49  AM  CBC  WBC 4.0 - 10.5 K/uL 10.6  6.9  8.7   Hemoglobin 12.0 - 15.0 g/dL 13.7  12.6  13.6   Hematocrit 36.0 - 46.0 % 43.7  39.7  42.0   Platelets 150 - 400 K/uL 342  339  430     Lab Results  Component Value Date   HGBA1C 5.8 (H) 03/27/2022   No results found for: "TSH"  ================================================== I spent a total of 13 minutes with the patient spent in direct patient consultation.  Additional time spent with chart review  / charting (studies, outside notes, etc): 12 min Total Time: 25 min  Current medicines are reviewed at length with the patient today.  (+/- concerns) n/a  Notice: This dictation was prepared with Dragon dictation along with smart phrase technology. Any transcriptional errors that result from this process are unintentional and may not be corrected upon review.  Studies Ordered:   Orders Placed This Encounter  Procedures   EKG 12-Lead   No  orders of the defined types were placed in this encounter.   Patient Instructions / Medication Changes & Studies & Tests Ordered   Patient Instructions  Medication Instructions:  No changes  *If you need a refill on your cardiac medications before your next appointment, please call your pharmacy*   Lab Work:  Not needed   Testing/Procedures:  Not needed  Follow-Up: At [facility], you and your health needs are our priority.  As part of our continuing mission to provide you with exceptional heart care, we have created designated Provider Care Teams.  These Care Teams include your primary Cardiologist (physician) and Advanced Practice Providers (APPs -  Physician Assistants and Nurse Practitioners) who all work together to provide you with the care you need, when you need it.     Your next appointment:   24 month(s)  The format for your next appointment:   In Person  Provider:   Glenetta Hew, MD      Leonie Man, MD, MS Glenetta Hew, M.D., M.S. Interventional Cardiologist  [place]  Pager # [phone] Phone # [phone] [address] [place], [place] [address]   Thank you for choosing [facility]!!

## 2022-07-23 DIAGNOSIS — G4733 Obstructive sleep apnea (adult) (pediatric): Secondary | ICD-10-CM | POA: Diagnosis not present

## 2022-07-28 ENCOUNTER — Encounter: Payer: Self-pay | Admitting: Cardiology

## 2022-07-28 NOTE — Assessment & Plan Note (Signed)
Smoking cessation instruction/counseling given:  counseled patient on the dangers of tobacco use, advised patient to stop smoking, and reviewed strategies to maximize success 

## 2022-07-28 NOTE — Assessment & Plan Note (Signed)
With family history of CAD, reasonable to treat with LDL police down to 923.  But hers is well below that target.  Most recent LDL was 54 which is goal for patients with known CAD.  She is tolerating atorvastatin with no issues, continue current dose.

## 2022-07-28 NOTE — Assessment & Plan Note (Signed)
Blood pressure looks well-controlled on current dose of losartan and Toprol.

## 2022-07-28 NOTE — Assessment & Plan Note (Signed)
No longer to diagnosis based on up-to-date echo.

## 2022-07-28 NOTE — Assessment & Plan Note (Signed)
Calcification seen on CT scan, the Coronary Calcium Score is actually 0 with no evidence of plaque on the Coronary CTA.  Remains asymptomatic.  Based on this course, no indication for aspirin.  As far as cardiac risk factors go, lipids are well-controlled on current dose of statin (ended been held following her knee surgery, but she is back on it along with fish oil); she is on losartan and Toprol.  She does have some intermittent palpitation episodes but they do not seem to be bothering her all that much.  If she has a bad day, she could take an extra half tablet of Toprol.

## 2022-07-29 DIAGNOSIS — B078 Other viral warts: Secondary | ICD-10-CM | POA: Diagnosis not present

## 2022-07-29 DIAGNOSIS — L309 Dermatitis, unspecified: Secondary | ICD-10-CM | POA: Diagnosis not present

## 2022-08-04 ENCOUNTER — Other Ambulatory Visit: Payer: Self-pay | Admitting: *Deleted

## 2022-08-04 MED ORDER — ACETAZOLAMIDE 250 MG PO TABS: ORAL_TABLET | ORAL | 0 refills | Status: DC

## 2022-08-07 ENCOUNTER — Ambulatory Visit: Payer: Medicare Other | Admitting: Family Medicine

## 2022-08-07 ENCOUNTER — Ambulatory Visit: Payer: Medicare Other | Admitting: Neurology

## 2022-08-12 DIAGNOSIS — G4733 Obstructive sleep apnea (adult) (pediatric): Secondary | ICD-10-CM | POA: Diagnosis not present

## 2022-08-15 ENCOUNTER — Inpatient Hospital Stay: Payer: Medicare Other

## 2022-08-21 ENCOUNTER — Inpatient Hospital Stay: Payer: Medicare Other | Attending: Hematology and Oncology

## 2022-08-21 DIAGNOSIS — D472 Monoclonal gammopathy: Secondary | ICD-10-CM

## 2022-08-21 DIAGNOSIS — Z79899 Other long term (current) drug therapy: Secondary | ICD-10-CM | POA: Insufficient documentation

## 2022-08-21 LAB — COMPREHENSIVE METABOLIC PANEL
ALT: 11 U/L (ref 0–44)
AST: 14 U/L — ABNORMAL LOW (ref 15–41)
Albumin: 4.5 g/dL (ref 3.5–5.0)
Alkaline Phosphatase: 96 U/L (ref 38–126)
Anion gap: 8 (ref 5–15)
BUN: 15 mg/dL (ref 8–23)
CO2: 24 mmol/L (ref 22–32)
Calcium: 9.5 mg/dL (ref 8.9–10.3)
Chloride: 108 mmol/L (ref 98–111)
Creatinine, Ser: 0.82 mg/dL (ref 0.44–1.00)
GFR, Estimated: >60 mL/min (ref >60)
Glucose, Bld: 99 mg/dL (ref 70–99)
Potassium: 4.2 mmol/L (ref 3.5–5.1)
Sodium: 140 mmol/L (ref 135–145)
Total Bilirubin: 0.3 mg/dL (ref 0.3–1.2)
Total Protein: 7.5 g/dL (ref 6.5–8.1)

## 2022-08-21 LAB — CBC WITH DIFFERENTIAL/PLATELET
Abs Immature Granulocytes: 0.03 10*3/uL (ref 0.00–0.07)
Basophils Absolute: 0.1 10*3/uL (ref 0.0–0.1)
Basophils Relative: 1 %
Eosinophils Absolute: 0.2 10*3/uL (ref 0.0–0.5)
Eosinophils Relative: 2 %
HCT: 40.1 % (ref 36.0–46.0)
Hemoglobin: 13 g/dL (ref 12.0–15.0)
Immature Granulocytes: 0 %
Lymphocytes Relative: 45 %
Lymphs Abs: 4.4 10*3/uL — ABNORMAL HIGH (ref 0.7–4.0)
MCH: 29.3 pg (ref 26.0–34.0)
MCHC: 32.4 g/dL (ref 30.0–36.0)
MCV: 90.5 fL (ref 80.0–100.0)
Monocytes Absolute: 0.6 10*3/uL (ref 0.1–1.0)
Monocytes Relative: 6 %
Neutro Abs: 4.5 10*3/uL (ref 1.7–7.7)
Neutrophils Relative %: 46 %
Platelets: 354 10*3/uL (ref 150–400)
RBC: 4.43 MIL/uL (ref 3.87–5.11)
RDW: 13.9 % (ref 11.5–15.5)
WBC: 9.8 10*3/uL (ref 4.0–10.5)
nRBC: 0 % (ref 0.0–0.2)

## 2022-08-22 ENCOUNTER — Inpatient Hospital Stay: Payer: Medicare Other | Admitting: Hematology and Oncology

## 2022-08-22 LAB — KAPPA/LAMBDA LIGHT CHAINS
Kappa free light chain: 27.8 mg/L — ABNORMAL HIGH (ref 3.3–19.4)
Kappa, lambda light chain ratio: 1.83 — ABNORMAL HIGH (ref 0.26–1.65)
Lambda free light chains: 15.2 mg/L (ref 5.7–26.3)

## 2022-08-27 LAB — MULTIPLE MYELOMA PANEL, SERUM
Albumin SerPl Elph-Mcnc: 4.1 g/dL (ref 2.9–4.4)
Albumin/Glob SerPl: 1.3 (ref 0.7–1.7)
Alpha 1: 0.2 g/dL (ref 0.0–0.4)
Alpha2 Glob SerPl Elph-Mcnc: 0.9 g/dL (ref 0.4–1.0)
B-Globulin SerPl Elph-Mcnc: 1 g/dL (ref 0.7–1.3)
Gamma Glob SerPl Elph-Mcnc: 1.1 g/dL (ref 0.4–1.8)
Globulin, Total: 3.2 g/dL (ref 2.2–3.9)
IgA: 129 mg/dL (ref 87–352)
IgG (Immunoglobin G), Serum: 1137 mg/dL (ref 586–1602)
IgM (Immunoglobulin M), Srm: 158 mg/dL (ref 26–217)
M Protein SerPl Elph-Mcnc: 0.3 g/dL — ABNORMAL HIGH
Total Protein ELP: 7.3 g/dL (ref 6.0–8.5)

## 2022-08-28 ENCOUNTER — Inpatient Hospital Stay (HOSPITAL_BASED_OUTPATIENT_CLINIC_OR_DEPARTMENT_OTHER): Payer: Medicare Other | Admitting: Hematology and Oncology

## 2022-08-28 ENCOUNTER — Encounter: Payer: Self-pay | Admitting: Hematology and Oncology

## 2022-08-28 VITALS — BP 161/73 | HR 58 | Temp 97.8°F | Resp 18 | Ht 62.0 in | Wt 155.0 lb

## 2022-08-28 DIAGNOSIS — D472 Monoclonal gammopathy: Secondary | ICD-10-CM | POA: Diagnosis not present

## 2022-08-28 DIAGNOSIS — Z79899 Other long term (current) drug therapy: Secondary | ICD-10-CM | POA: Diagnosis not present

## 2022-08-28 NOTE — Progress Notes (Signed)
[facility] OFFICE PROGRESS NOTE  Patient Care Team: Harlan Stains, MD as PCP - General (Family Medicine) Leonie Man, MD as PCP - Cardiology (Cardiology) Bo Merino, MD as Consulting Physician (Rheumatology) Harlan Stains, MD as Attending Physician (Family Medicine)  ASSESSMENT & PLAN:  MGUS (monoclonal gammopathy of unknown significance) So far, her myeloma panel is stable and she has no evidence of endorgan damage We discussed the natural history of MGUS I will see her once a year  No orders of the defined types were placed in this encounter.   All questions were answered. The patient knows to call the clinic with any problems, questions or concerns. The total time spent in the appointment was 20 minutes encounter with patients including review of chart and various tests results, discussions about plan of care and coordination of care plan   Heath Lark, MD 08/28/2022 1:26 PM  INTERVAL HISTORY: Please see below for problem oriented charting. she returns for severe as follow-up for MGUS She is doing well She is not symptomatic No recent infection We review her recent test results  REVIEW OF SYSTEMS:   Constitutional: Denies fevers, chills or abnormal weight loss Eyes: Denies blurriness of vision Ears, nose, mouth, throat, and face: Denies mucositis or sore throat Respiratory: Denies cough, dyspnea or wheezes Cardiovascular: Denies palpitation, chest discomfort or lower extremity swelling Gastrointestinal:  Denies nausea, heartburn or change in bowel habits Skin: Denies abnormal skin rashes Lymphatics: Denies new lymphadenopathy or easy bruising Neurological:Denies numbness, tingling or new weaknesses Behavioral/Psych: Mood is stable, no new changes  All other systems were reviewed with the patient and are negative.  I have reviewed the past medical history, past surgical history, social history and family history with the patient and they are  unchanged from previous note.  ALLERGIES:  is allergic to aripiprazole, fluoxetine, hydrocodone, and percocet [oxycodone-acetaminophen].  MEDICATIONS:  Current Outpatient Medications  Medication Sig Dispense Refill   acetaminophen (TYLENOL) 500 MG tablet Take 1,000 mg by mouth daily as needed for mild pain.     acetaZOLAMIDE (DIAMOX) 250 MG tablet Take 1 tablet twice daily 180 tablet 0   ALPRAZolam (XANAX) 0.5 MG tablet Take 0.25 mg by mouth at bedtime.     atorvastatin (LIPITOR) 20 MG tablet Take 20 mg by mouth daily.     brexpiprazole (REXULTI) 2 MG TABS tablet Take 2 mg by mouth at bedtime.     Cholecalciferol (VITAMIN D-3) 5000 UNITS TABS Take 5,000 Units by mouth daily.     desvenlafaxine (PRISTIQ) 100 MG 24 hr tablet Take 100 mg by mouth daily.     ibuprofen (ADVIL,MOTRIN) 600 MG tablet Take 1 tablet (600 mg total) by mouth every 6 (six) hours as needed (mild pain). 30 tablet 0   losartan (COZAAR) 50 MG tablet Take 25 mg by mouth daily with supper.     magnesium oxide (MAG-OX) 400 MG tablet Take 400 mg by mouth at bedtime.     metoprolol succinate (TOPROL-XL) 100 MG 24 hr tablet Take 100 mg by mouth daily.     omeprazole (PRILOSEC) 20 MG capsule Take 20 mg by mouth every morning.     triamcinolone (NASACORT) 55 MCG/ACT AERO nasal inhaler Place 2 sprays into the nose daily as needed (Allergies and congestion).     No current facility-administered medications for this visit.    SUMMARY OF ONCOLOGIC HISTORY: Oncology History   No history exists.    PHYSICAL EXAMINATION: ECOG PERFORMANCE STATUS: 0 - Asymptomatic  Vitals:  08/28/22 1227  BP: (!) 161/73  Pulse: (!) 58  Resp: 18  Temp: 97.8 F (36.6 C)  SpO2: 100%   Filed Weights   08/28/22 1227  Weight: 155 lb (70.3 kg)    GENERAL:alert, no distress and comfortable  NEURO: alert & oriented x 3 with fluent speech, no focal motor/sensory deficits  LABORATORY DATA:  I have reviewed the data as listed    Component  Value Date/Time   NA 140 08/21/2022 1202   NA 140 04/24/2021 1612   NA 140 03/06/2017 1027   K 4.2 08/21/2022 1202   K 4.5 03/06/2017 1027   CL 108 08/21/2022 1202   CL 103 06/11/2012 1059   CO2 24 08/21/2022 1202   CO2 25 03/06/2017 1027   GLUCOSE 99 08/21/2022 1202   GLUCOSE 111 03/06/2017 1027   GLUCOSE 123 (H) 06/11/2012 1059   BUN 15 08/21/2022 1202   BUN 20 04/24/2021 1612   BUN 17.2 03/06/2017 1027   CREATININE 0.82 08/21/2022 1202   CREATININE 1.0 03/06/2017 1027   CALCIUM 9.5 08/21/2022 1202   CALCIUM 10.0 03/06/2017 1027   PROT 7.5 08/21/2022 1202   PROT 7.5 03/06/2017 1027   PROT 7.0 03/06/2017 1027   ALBUMIN 4.5 08/21/2022 1202   ALBUMIN 4.0 03/06/2017 1027   AST 14 (L) 08/21/2022 1202   AST 14 03/06/2017 1027   ALT 11 08/21/2022 1202   ALT 12 03/06/2017 1027   ALKPHOS 96 08/21/2022 1202   ALKPHOS 60 03/06/2017 1027   BILITOT 0.3 08/21/2022 1202   BILITOT 0.30 03/06/2017 1027   GFRNONAA >60 08/21/2022 1202   GFRAA 72 11/21/2016 0951    No results found for: "SPEP", "UPEP"  Lab Results  Component Value Date   WBC 9.8 08/21/2022   NEUTROABS 4.5 08/21/2022   HGB 13.0 08/21/2022   HCT 40.1 08/21/2022   MCV 90.5 08/21/2022   PLT 354 08/21/2022      Chemistry      Component Value Date/Time   NA 140 08/21/2022 1202   NA 140 04/24/2021 1612   NA 140 03/06/2017 1027   K 4.2 08/21/2022 1202   K 4.5 03/06/2017 1027   CL 108 08/21/2022 1202   CL 103 06/11/2012 1059   CO2 24 08/21/2022 1202   CO2 25 03/06/2017 1027   BUN 15 08/21/2022 1202   BUN 20 04/24/2021 1612   BUN 17.2 03/06/2017 1027   CREATININE 0.82 08/21/2022 1202   CREATININE 1.0 03/06/2017 1027      Component Value Date/Time   CALCIUM 9.5 08/21/2022 1202   CALCIUM 10.0 03/06/2017 1027   ALKPHOS 96 08/21/2022 1202   ALKPHOS 60 03/06/2017 1027   AST 14 (L) 08/21/2022 1202   AST 14 03/06/2017 1027   ALT 11 08/21/2022 1202   ALT 12 03/06/2017 1027   BILITOT 0.3 08/21/2022 1202    BILITOT 0.30 03/06/2017 1027

## 2022-08-28 NOTE — Assessment & Plan Note (Addendum)
So far, her myeloma panel is stable and she has no evidence of endorgan damage We discussed the natural history of MGUS I will see her once a year

## 2022-09-09 DIAGNOSIS — G4733 Obstructive sleep apnea (adult) (pediatric): Secondary | ICD-10-CM | POA: Diagnosis not present

## 2022-09-09 DIAGNOSIS — I10 Essential (primary) hypertension: Secondary | ICD-10-CM | POA: Diagnosis not present

## 2022-09-09 DIAGNOSIS — L309 Dermatitis, unspecified: Secondary | ICD-10-CM | POA: Diagnosis not present

## 2022-09-09 DIAGNOSIS — B078 Other viral warts: Secondary | ICD-10-CM | POA: Diagnosis not present

## 2022-09-10 DIAGNOSIS — G4733 Obstructive sleep apnea (adult) (pediatric): Secondary | ICD-10-CM | POA: Diagnosis not present

## 2022-09-11 ENCOUNTER — Telehealth: Payer: Self-pay | Admitting: Family Medicine

## 2022-09-25 DIAGNOSIS — N390 Urinary tract infection, site not specified: Secondary | ICD-10-CM | POA: Diagnosis not present

## 2022-10-10 DIAGNOSIS — Z96651 Presence of right artificial knee joint: Secondary | ICD-10-CM | POA: Diagnosis not present

## 2022-10-11 DIAGNOSIS — G4733 Obstructive sleep apnea (adult) (pediatric): Secondary | ICD-10-CM | POA: Diagnosis not present

## 2022-11-04 ENCOUNTER — Other Ambulatory Visit: Payer: Self-pay

## 2022-11-04 DIAGNOSIS — G723 Periodic paralysis: Secondary | ICD-10-CM

## 2022-11-04 MED ORDER — ACETAZOLAMIDE 250 MG PO TABS: ORAL_TABLET | ORAL | 1 refills | Status: DC

## 2022-11-10 DIAGNOSIS — G4733 Obstructive sleep apnea (adult) (pediatric): Secondary | ICD-10-CM | POA: Diagnosis not present

## 2022-12-09 DIAGNOSIS — G4733 Obstructive sleep apnea (adult) (pediatric): Secondary | ICD-10-CM | POA: Diagnosis not present

## 2022-12-11 DIAGNOSIS — G4733 Obstructive sleep apnea (adult) (pediatric): Secondary | ICD-10-CM | POA: Diagnosis not present

## 2023-01-10 DIAGNOSIS — G4733 Obstructive sleep apnea (adult) (pediatric): Secondary | ICD-10-CM | POA: Diagnosis not present

## 2023-01-15 DIAGNOSIS — E782 Mixed hyperlipidemia: Secondary | ICD-10-CM | POA: Diagnosis not present

## 2023-01-15 DIAGNOSIS — M8588 Other specified disorders of bone density and structure, other site: Secondary | ICD-10-CM | POA: Diagnosis not present

## 2023-01-15 DIAGNOSIS — G4733 Obstructive sleep apnea (adult) (pediatric): Secondary | ICD-10-CM | POA: Diagnosis not present

## 2023-01-15 DIAGNOSIS — D472 Monoclonal gammopathy: Secondary | ICD-10-CM | POA: Diagnosis not present

## 2023-01-15 DIAGNOSIS — Z0000 Encounter for general adult medical examination without abnormal findings: Secondary | ICD-10-CM | POA: Diagnosis not present

## 2023-01-15 DIAGNOSIS — E559 Vitamin D deficiency, unspecified: Secondary | ICD-10-CM | POA: Diagnosis not present

## 2023-01-15 DIAGNOSIS — R7309 Other abnormal glucose: Secondary | ICD-10-CM | POA: Diagnosis not present

## 2023-01-15 DIAGNOSIS — I10 Essential (primary) hypertension: Secondary | ICD-10-CM | POA: Diagnosis not present

## 2023-01-15 DIAGNOSIS — F17200 Nicotine dependence, unspecified, uncomplicated: Secondary | ICD-10-CM | POA: Diagnosis not present

## 2023-01-15 DIAGNOSIS — J449 Chronic obstructive pulmonary disease, unspecified: Secondary | ICD-10-CM | POA: Diagnosis not present

## 2023-01-15 DIAGNOSIS — K219 Gastro-esophageal reflux disease without esophagitis: Secondary | ICD-10-CM | POA: Diagnosis not present

## 2023-01-15 DIAGNOSIS — I700 Atherosclerosis of aorta: Secondary | ICD-10-CM | POA: Diagnosis not present

## 2023-01-20 ENCOUNTER — Ambulatory Visit: Payer: Medicare Other | Admitting: Neurology

## 2023-01-20 ENCOUNTER — Encounter: Payer: Self-pay | Admitting: Neurology

## 2023-01-20 VITALS — BP 104/61 | HR 55 | Ht 62.5 in | Wt 150.0 lb

## 2023-01-20 DIAGNOSIS — G4733 Obstructive sleep apnea (adult) (pediatric): Secondary | ICD-10-CM | POA: Diagnosis not present

## 2023-01-20 DIAGNOSIS — G723 Periodic paralysis: Secondary | ICD-10-CM | POA: Diagnosis not present

## 2023-01-20 NOTE — Progress Notes (Signed)
[facility]  PATIENT: [name] DOB: 1956-01-25  REFERRING DOCTOR OR PCP:  Dr. Laurann Montana SOURCE: patient, records from Dr. Cliffton Asters, imaging and lab reports  _________________________________   HISTORICAL  CHIEF COMPLAINT:  Chief Complaint  Patient presents with   Follow-up    Pt in room 11. Here for Periodic paralysis, pt reports doing well, still taking Diamox 250 mg bid works well.    HISTORY OF PRESENT ILLNESS:   [name] is a 67 y.o. woman wih episodes of weakness.  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   She has no family history of episodic weakness.    Symptoms started in her 40's.   Her kids in their 70's are doing well.  She started Diamox in 2018 and she has not had a single major spell since starting (prior to Diamox, she was having 3-5 spells of weakness a week lasting hours at a time).     She is on Diamox 250 mg po bid   She reports doing well an has no major spell since starting and no recent minor spell.    Carbonated beverages taste a little funny.   She has IgG kappa MGUS (diagnosed in 2013) and sees Dr. Bertis Ruddy.   Her M-spike was 0.3 in 2018. She had a BM biopsy and bone survey at diagnosis.    She denies numbness as would be expected if she had polyneuropathy.  Balance is fine and she does not need to hold on to the wall when eyes closed (I.e shampooing hair).      Bone survey in 2017 showed stable lytic inferior pubic rami and stable subtle skull lucencies.    She sees hematology and is continuing with watchful waiting.    She does bloodwork annually  Depression and anxiety are doing well (sees Marchelle Folks)  In 2017, she was diagnosed with severe obstructive sleep apnea. She was placed on CPAP by Dr. Earl Gala [facility]) . Sleepiness improved with every night use.       REVIEW  OF SYSTEMS: Constitutional: No fevers, chills, sweats, or change in appetite Eyes: No visual changes, double vision, eye pain Ear, nose and throat: No hearing loss, ear pain, nasal congestion, sore throat Cardiovascular: No chest pain, palpitations Respiratory:  No shortness of breath at rest or with exertion.   No wheezes GastrointestinaI: No nausea, vomiting, diarrhea, abdominal pain, fecal incontinence.  She has GERD Genitourinary:  No dysuria, urinary retention or frequency.  No nocturia. Musculoskeletal:  No neck pain, back pain Integumentary: No rash, pruritus, skin lesions Neurological: as above Psychiatric: Notes depression and anxiety Endocrine: No palpitations, diaphoresis, change in appetite, change in weigh or increased thirst Hematologic/Lymphatic:  No anemia, purpura, petechiae.   She has MGUS Allergic/Immunologic: No itchy/runny eyes, nasal congestion, recent allergic reactions, rashes  ALLERGIES: Allergies  Allergen Reactions   Aripiprazole Other (See Comments)    Felt hyper   Fluoxetine Other (See Comments)    Ineffective   Hydrocodone Other (See Comments)    Headache   Percocet [Oxycodone-Acetaminophen] Other (See Comments)    Causes headaches    HOME MEDICATIONS:  Current Outpatient Medications:    acetaminophen (TYLENOL) 500 MG tablet, Take 1,000 mg by mouth daily as needed for mild pain., Disp: , Rfl:    acetaZOLAMIDE (DIAMOX) 250 MG tablet, Take 1 tablet twice daily, Disp: 180 tablet, Rfl: 1  ALPRAZolam (XANAX) 0.5 MG tablet, Take 0.25 mg by mouth at bedtime., Disp: , Rfl:    atorvastatin (LIPITOR) 20 MG tablet, Take 20 mg by mouth daily., Disp: , Rfl:    brexpiprazole (REXULTI) 2 MG TABS tablet, Take 2 mg by mouth at bedtime., Disp: , Rfl:    Cholecalciferol (VITAMIN D-3) 5000 UNITS TABS, Take 5,000 Units by mouth daily., Disp: , Rfl:    desvenlafaxine (PRISTIQ) 100 MG 24 hr tablet, Take 100 mg by mouth daily., Disp: , Rfl:    ibuprofen (ADVIL,MOTRIN)  600 MG tablet, Take 1 tablet (600 mg total) by mouth every 6 (six) hours as needed (mild pain)., Disp: 30 tablet, Rfl: 0   losartan (COZAAR) 50 MG tablet, Take 25 mg by mouth daily with supper., Disp: , Rfl:    magnesium oxide (MAG-OX) 400 MG tablet, Take 400 mg by mouth at bedtime., Disp: , Rfl:    metoprolol succinate (TOPROL-XL) 100 MG 24 hr tablet, Take 100 mg by mouth daily., Disp: , Rfl:    omeprazole (PRILOSEC) 20 MG capsule, Take 20 mg by mouth every morning., Disp: , Rfl:    triamcinolone (NASACORT) 55 MCG/ACT AERO nasal inhaler, Place 2 sprays into the nose daily as needed (Allergies and congestion)., Disp: , Rfl:   PAST MEDICAL HISTORY: Past Medical History:  Diagnosis Date   Anxiety    Aortic atherosclerosis (HCC)    Seen on imaging studies   Cervical disc disease    Complication of anesthesia    prolonged sedation   COPD (chronic obstructive pulmonary disease) with emphysema (HCC)    Family history of adverse reaction to anesthesia    Mother N&V   GERD (gastroesophageal reflux disease)    HTN (hypertension)    Hyperlipidemia    Lumbar herniated disc 07/07/2021   MDD (major depressive disorder), recurrent, in partial remission (HCC)    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  Minimum saturation 79%-Dr. Allie Dimmer   Osteoarthritis    generalized   PONV (postoperative nausea and vomiting)    Pre-diabetes    Improved with weigth loss   Prediabetes    Tobacco dependence    Vision abnormalities     PAST SURGICAL HISTORY: Past Surgical History:  Procedure Laterality Date   ANTERIOR AND POSTERIOR REPAIR N/A 10/16/2015   Procedure: ANTERIOR (CYSTOCELE) AND POSTERIOR REPAIR (RECTOCELE);  Surgeon: Patton Salles, MD;  Location: [facility];  Service: Gynecology;  Laterality: N/A;    APPENDECTOMY  1974   BLADDER SUSPENSION N/A 10/16/2015   Procedure: TRANSVAGINAL TAPE (TVT) PROCEDURE exact midurethral sling;  Surgeon: Patton Salles, MD;  Location: [facility];  Service: Gynecology;  Laterality: N/A;   BREAST BIOPSY     negative x 2    BREAST EXCISIONAL BIOPSY Left 1998   BREAST EXCISIONAL BIOPSY Right 1986   BUNIONECTOMY     w/hammer toe repair   CARPAL TUNNEL RELEASE Right    CERVICAL DISC SURGERY  12/2004   CT CTA CORONARY W/CA SCORE W/CM &/OR WO/CM  04/26/2019   Coronary calcium score 0.  No evidence of CAD.   CYSTO N/A 10/16/2015   Procedure: Clearnce Sorrel;  Surgeon: Patton Salles, MD;  Location: [facility];  Service: Gynecology;  Laterality: N/A;   CYSTOSCOPY N/A 02/26/2016   Procedure:  CYSTOSCOPY;  Surgeon: Patton Salles, MD;  Location: [facility];  Service: Gynecology;  Laterality: N/A;   KNEE ARTHROSCOPY     right knee x 2   MENISCUS REPAIR     right knee x 1   TENS     upper and lower back   TONSILECTOMY, ADENOIDECTOMY, BILATERAL MYRINGOTOMY AND TUBES     TOTAL KNEE ARTHROPLASTY Right 04/09/2022   Procedure: TOTAL KNEE ARTHROPLASTY;  Surgeon: Jene Every, MD;  Location: [facility];  Service: Orthopedics;  Laterality: Right;   TOTAL VAGINAL HYSTERECTOMY  05/2004   adenomyosis, prolapse--ovaries remain   TRANSTHORACIC ECHOCARDIOGRAM  05/17/2019   EF 60 to 65%.  No R WMA.  Normal diastolic numbers.  No MR or MS.  No comment on MVP.  Mild aortic sclerosis with no stenosis.  Normal RAP.   TRANSVAGINAL TAPE (TVT) REMOVAL N/A 02/26/2016   Procedure: Lysis of mid urethral sling, Anterior Colporrhaphy, Cystoscopy ;  Surgeon: Patton Salles, MD;  Location: [facility];  Service: Gynecology;  Laterality: N/A;  first case/ move first case to follow this. Need 1 hour/    TUBAL LIGATION      FAMILY HISTORY: Family History  Problem Relation Age of Onset   Heart failure Mother        congestive-presumably nonischemic cardiomyopathy   Diabetes  Mellitus II Mother    Hypertension Mother    Coronary artery disease Father 53       MI   Diabetes Father    Heart attack Father 69       x2   Stroke Father 17   Hypertension Father    Hyperlipidemia Father    Diabetes Mellitus II Father    Cancer Brother 18       lung cancer   Cancer Brother        metastasized, unknown origin   Diabetes Brother    Colon cancer Paternal Grandmother    Breast cancer Cousin    Breast cancer Cousin     SOCIAL HISTORY:  Social History   Socioeconomic History   Marital status: Married    Spouse name: Not on file   Number of children: 2   Years of education: Not on file   Highest education level: Not on file  Occupational History    Comment: Intake coordinator   Occupation: Intake coordinator/administration    Comment: Disabled  Tobacco Use   Smoking status: Every Day    Current packs/day: 0.25    Average packs/day: 0.3 packs/day for 48.0 years (12.0 ttl pk-yrs)    Types: Cigarettes   Smokeless tobacco: Never   Tobacco comments:    feb 202 at 1/2 to 3/4 pack cigs per day  Vaping Use   Vaping status: Every Day   Start date: 02/05/2020  Substance and Sexual Activity   Alcohol use: No    Alcohol/week: 0.0 standard drinks of alcohol   Drug use: No   Sexual activity: Not Currently    Partners: Female    Birth control/protection: Surgical    Comment: Hyst  Other Topics Concern   Not on file  Social History Narrative   Right handed    Caffeine use: 3 cups per day   Lives with partner: Rubye [place].   --> 2 kids [place]; 2 grandchildren   Prescription, but does not go to church.      Has cut down to 7 cigarettes a day from 10-12.  He is  using vaping to assist.      Exercise: Tries to go 3 days a week to the [facility].  She does about mile and a half on the treadmill and up 30-minute on machines.  She used to do it more frequently and longer on the treadmill at least 3 miles, but this is been reduced her knee  pain.   Social Determinants of Health   Financial Resource Strain: Not on file  Food Insecurity: No Food Insecurity (04/09/2022)   Hunger Vital Sign    Worried About Running Out of Food in the Last Year: Never true    Ran Out of Food in the Last Year: Never true  Transportation Needs: No Transportation Needs (04/09/2022)   PRAPARE - Administrator, Civil Service (Medical): No    Lack of Transportation (Non-Medical): No  Physical Activity: Not on file  Stress: Not on file  Social Connections: Unknown (11/17/2021)   [facility]   Social Network    Social Network: Not on file  Intimate Partner Violence: Not At Risk (04/09/2022)   Humiliation, Afraid, Rape, and Kick questionnaire    Fear of Current or Ex-Partner: No    Emotionally Abused: No    Physically Abused: No    Sexually Abused: No     PHYSICAL EXAM  Vitals:   01/20/23 1124  BP: 104/61  Pulse: (!) 55  Weight: 150 lb (68 kg)  Height: 5' 2.5" (1.588 m)    Body mass index is 27 kg/m.   Gen. exam: The head is normocephalic and atraumatic. The neck is nontender. Neck has a good range of motion.  Mental status: She is alert and fully oriented with fluent speech. Memory and attention appear to be appropriate.  Cranial nerves: Extraocular muscles are intact. Pupils react to light and accommodation. Facial strength and sensation is normal. Trapezius strength is normal.   Hearing symmetric  Motor: Muscle bulk is normal. Muscle tone is normal.  Strength is 5/5 in the arms and legs.  No myotonia . Sensory: She has intact sensation to touch and vibration in the arms and legs  Coordination: Finger-nose-finger and heel-to-shin is performed well.  Gait: The station is normal.  Tandem gait normal for age.  Romberg is negative.Marland Kitchen  DTRs: Normal and symmetric in arms and knees    DIAGNOSTIC DATA (LABS, IMAGING, TESTING) - I reviewed patient records, labs, notes, testing and imaging  myself where available.  Lab Results  Component Value Date   WBC 9.8 08/21/2022   HGB 13.0 08/21/2022   HCT 40.1 08/21/2022   MCV 90.5 08/21/2022   PLT 354 08/21/2022      Component Value Date/Time   NA 140 08/21/2022 1202   NA 140 04/24/2021 1612   NA 140 03/06/2017 1027   K 4.2 08/21/2022 1202   K 4.5 03/06/2017 1027   CL 108 08/21/2022 1202   CL 103 06/11/2012 1059   CO2 24 08/21/2022 1202   CO2 25 03/06/2017 1027   GLUCOSE 99 08/21/2022 1202   GLUCOSE 111 03/06/2017 1027   GLUCOSE 123 (H) 06/11/2012 1059   BUN 15 08/21/2022 1202   BUN 20 04/24/2021 1612   BUN 17.2 03/06/2017 1027   CREATININE 0.82 08/21/2022 1202   CREATININE 1.0 03/06/2017 1027   CALCIUM 9.5 08/21/2022 1202   CALCIUM 10.0 03/06/2017 1027   PROT 7.5 08/21/2022 1202   PROT 7.5 03/06/2017 1027   PROT 7.0 03/06/2017 1027   ALBUMIN 4.5 08/21/2022  1202   ALBUMIN 4.0 03/06/2017 1027   AST 14 (L) 08/21/2022 1202   AST 14 03/06/2017 1027   ALT 11 08/21/2022 1202   ALT 12 03/06/2017 1027   ALKPHOS 96 08/21/2022 1202   ALKPHOS 60 03/06/2017 1027   BILITOT 0.3 08/21/2022 1202   BILITOT 0.30 03/06/2017 1027   GFRNONAA >60 08/21/2022 1202   GFRAA 72 11/21/2016 0951       ASSESSMENT AND PLAN  Periodic paralysis  OSA on CPAP   1.   Continue Diamox 250 mg po bid and try to avoid high carbohydrate intake.  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      2.    Continue CPAP (Dr. Earl Gala at [facility] follows).  Sees hematology for MGUS - no neurologic manifestation. 3.     Return in 12 months or sooner if there are new or worsening neurologic symptoms.     Can alternate between seeing me and a nurse practitioner   Jadien Lehigh A. Epimenio Foot, MD, PhD 01/20/2023, 2:23 PM Certified in Neurology, Clinical Neurophysiology, Sleep Medicine, Pain Medicine and Neuroimaging  [facility] [address] [place], [place]  [address] [phone]

## 2023-02-10 DIAGNOSIS — G4733 Obstructive sleep apnea (adult) (pediatric): Secondary | ICD-10-CM | POA: Diagnosis not present

## 2023-03-06 ENCOUNTER — Other Ambulatory Visit: Payer: Self-pay | Admitting: Obstetrics and Gynecology

## 2023-03-06 DIAGNOSIS — Z1231 Encounter for screening mammogram for malignant neoplasm of breast: Secondary | ICD-10-CM

## 2023-03-10 DIAGNOSIS — G4733 Obstructive sleep apnea (adult) (pediatric): Secondary | ICD-10-CM | POA: Diagnosis not present

## 2023-03-10 DIAGNOSIS — I10 Essential (primary) hypertension: Secondary | ICD-10-CM | POA: Diagnosis not present

## 2023-03-17 DIAGNOSIS — K648 Other hemorrhoids: Secondary | ICD-10-CM | POA: Diagnosis not present

## 2023-04-01 ENCOUNTER — Ambulatory Visit
Admission: RE | Admit: 2023-04-01 | Discharge: 2023-04-01 | Disposition: A | Discharge status: Home / Self Care | Payer: Medicare Other | Source: Ambulatory Visit | Attending: Obstetrics and Gynecology | Admitting: Obstetrics and Gynecology

## 2023-04-01 DIAGNOSIS — Z1231 Encounter for screening mammogram for malignant neoplasm of breast: Secondary | ICD-10-CM

## 2023-04-07 DIAGNOSIS — Z96651 Presence of right artificial knee joint: Secondary | ICD-10-CM | POA: Diagnosis not present

## 2023-05-04 ENCOUNTER — Other Ambulatory Visit: Payer: Self-pay

## 2023-05-04 DIAGNOSIS — G723 Periodic paralysis: Secondary | ICD-10-CM

## 2023-05-04 MED ORDER — ACETAZOLAMIDE 250 MG PO TABS: ORAL_TABLET | ORAL | 1 refills | Status: DC

## 2023-06-16 DIAGNOSIS — G4733 Obstructive sleep apnea (adult) (pediatric): Secondary | ICD-10-CM | POA: Diagnosis not present

## 2023-06-22 ENCOUNTER — Ambulatory Visit (HOSPITAL_BASED_OUTPATIENT_CLINIC_OR_DEPARTMENT_OTHER)
Admission: RE | Admit: 2023-06-22 | Discharge: 2023-06-22 | Disposition: A | Discharge status: Home / Self Care | Payer: Medicare Other | Source: Ambulatory Visit | Attending: Family Medicine | Admitting: Family Medicine

## 2023-06-22 DIAGNOSIS — F17210 Nicotine dependence, cigarettes, uncomplicated: Secondary | ICD-10-CM | POA: Diagnosis not present

## 2023-06-22 DIAGNOSIS — Z122 Encounter for screening for malignant neoplasm of respiratory organs: Secondary | ICD-10-CM | POA: Diagnosis not present

## 2023-06-22 DIAGNOSIS — Z87891 Personal history of nicotine dependence: Secondary | ICD-10-CM | POA: Insufficient documentation

## 2023-07-03 ENCOUNTER — Other Ambulatory Visit: Payer: Self-pay

## 2023-07-03 DIAGNOSIS — Z87891 Personal history of nicotine dependence: Secondary | ICD-10-CM

## 2023-07-03 DIAGNOSIS — Z122 Encounter for screening for malignant neoplasm of respiratory organs: Secondary | ICD-10-CM

## 2023-07-03 DIAGNOSIS — F17210 Nicotine dependence, cigarettes, uncomplicated: Secondary | ICD-10-CM

## 2023-07-16 DIAGNOSIS — I700 Atherosclerosis of aorta: Secondary | ICD-10-CM | POA: Diagnosis not present

## 2023-07-16 DIAGNOSIS — E782 Mixed hyperlipidemia: Secondary | ICD-10-CM | POA: Diagnosis not present

## 2023-07-16 DIAGNOSIS — J449 Chronic obstructive pulmonary disease, unspecified: Secondary | ICD-10-CM | POA: Diagnosis not present

## 2023-07-16 DIAGNOSIS — F17200 Nicotine dependence, unspecified, uncomplicated: Secondary | ICD-10-CM | POA: Diagnosis not present

## 2023-07-16 DIAGNOSIS — I10 Essential (primary) hypertension: Secondary | ICD-10-CM | POA: Diagnosis not present

## 2023-07-20 ENCOUNTER — Telehealth (INDEPENDENT_AMBULATORY_CARE_PROVIDER_SITE_OTHER): Payer: Self-pay | Admitting: Otolaryngology

## 2023-07-20 NOTE — Telephone Encounter (Signed)
 LVM to confirm appt & location [phone] afm

## 2023-07-21 ENCOUNTER — Encounter (INDEPENDENT_AMBULATORY_CARE_PROVIDER_SITE_OTHER): Payer: Self-pay

## 2023-07-21 ENCOUNTER — Telehealth: Payer: Self-pay | Admitting: Hematology and Oncology

## 2023-07-21 ENCOUNTER — Ambulatory Visit (INDEPENDENT_AMBULATORY_CARE_PROVIDER_SITE_OTHER): Payer: Medicare Other | Admitting: Otolaryngology

## 2023-07-21 VITALS — BP 144/86 | HR 64 | Resp 19 | Ht 62.0 in | Wt 141.0 lb

## 2023-07-21 DIAGNOSIS — R0981 Nasal congestion: Secondary | ICD-10-CM | POA: Diagnosis not present

## 2023-07-21 DIAGNOSIS — J342 Deviated nasal septum: Secondary | ICD-10-CM | POA: Diagnosis not present

## 2023-07-21 DIAGNOSIS — G4733 Obstructive sleep apnea (adult) (pediatric): Secondary | ICD-10-CM | POA: Diagnosis not present

## 2023-07-21 DIAGNOSIS — Z91198 Patient's noncompliance with other medical treatment and regimen for other reason: Secondary | ICD-10-CM | POA: Diagnosis not present

## 2023-07-21 DIAGNOSIS — J3489 Other specified disorders of nose and nasal sinuses: Secondary | ICD-10-CM | POA: Diagnosis not present

## 2023-07-21 DIAGNOSIS — Z789 Other specified health status: Secondary | ICD-10-CM

## 2023-07-21 DIAGNOSIS — F17210 Nicotine dependence, cigarettes, uncomplicated: Secondary | ICD-10-CM | POA: Diagnosis not present

## 2023-07-21 DIAGNOSIS — F17200 Nicotine dependence, unspecified, uncomplicated: Secondary | ICD-10-CM

## 2023-07-21 DIAGNOSIS — J343 Hypertrophy of nasal turbinates: Secondary | ICD-10-CM | POA: Diagnosis not present

## 2023-07-21 MED ORDER — SALINE SPRAY 0.65 % NA SOLN: 2.0000 | NASAL | 2 refills | Status: DC | PRN

## 2023-07-21 MED ORDER — AZELASTINE HCL 0.1 % NA SOLN: 2.0000 | Freq: Two times a day (BID) | NASAL | 12 refills | Status: DC | PRN

## 2023-07-21 NOTE — Progress Notes (Signed)
 Dear Dr. Arlyss, Here is my assessment for our mutual patient, [name]. Thank you for allowing me the opportunity to care for your patient. Please do not hesitate to contact me should you have any other questions. Sincerely, Dr. Eldora Blanch  Otolaryngology Clinic Note Referring provider: Dr. Arlyss HPI:  [name] is a 68 y.o. female kindly referred by Dr. Arlyss for evaluation of sinus issues and CPAP intolerance.  Initial visit (07/2023): Patient reports that she gets so congested some times that she cannot breathe through the night. She also reports some mucoid rhinorrhea which is bothersome. This is significantly bothersome for her because she uses a nasal CPAP and can't tolerate it due to nasal obstruction. It is disrupting her quality of life. Patient reports that this is on both sides, but more apparent when she lays on the right side. Also reports trouble breathing out of the nose during the day. She has had vivaer done with Dr. Llewellyn in Feb 2023 and it did not help.   She denies any history of frequent sinus infections -- last one reported several years ago which is reported as congestion, facial pressure, discolored drainage. No pain or pressure in face, discolored drainage. Smell is down some, however.   She has thought about inspire but she'd prefer nasal surgery first. Cannot tolerate CPAP masks and pulls it off even though she has tried several and changed settings.  No recent antibiotics or steroids.  She does report season allergies - uses nasacort , and it helps some. Tried allegra for several years, and it helped some.Has tried breathe rite strips, no significant help.SABRA   No prior allergy testing or sinus CT.   H&N Surgery: Vivaer 2023, ACDF (several years ago) Personal or FHx of bleeding dz or anesthesia difficulty: slow to wake up but no complications  GLP-1: Takes Semaglutide - weight loss AP/AC: no  PMHx: HTN, prior MVP (but recently said not  present by Cardiology), OSA, MGUS, Depression, Periodic Paralysis, OSA on CPAP (AHI 51)  Tobacco: vapes and uses 7 cigarettes/day. Lives in [place], [place]  Independent Review of Additional Tests or Records:  Dr. Llewellyn (ENT) 10/08/2021 and 08/08/2021: noted nasal congestion and OSA on CPAP, using nasacort  and decongestant; no formal allergy testing, no CRS sx; Dx: Septal deviation, b/l ITH, NVR; then underwent Vivaer; no significant improvement Leita Arlyss [facility] (06/16/2023): referral notes reviewed and uploaded or available in chart in media tab: eval for Inspire and sleep issues, intolerance to CPAP - significant nasal congestion at night, using nasal mask + mouth tape ut has to remove CPAP at 3 AM; using nasacort , Noted 100% use of CPAP; Dx: CPAP intolerance, ref ENT for Inspire or septoplasty Sleep study 12/2015 reviewed independently - AHI 51.2, O2 nadir 79%, no central apneas Labs 08/21/2022 CBC and CMP: WBC 9.8, Plt 354, Hgb 13, CMP Na 140, No kidney disease  PMH/Meds/All/SocHx/FamHx/ROS:   Past Medical History:  Diagnosis Date   Anxiety    Aortic atherosclerosis (HCC)    Seen on imaging studies   Cervical disc disease    Complication of anesthesia    prolonged sedation   COPD (chronic obstructive pulmonary disease) with emphysema (HCC)    Family history of adverse reaction to anesthesia    Mother N&V   GERD (gastroesophageal reflux disease)    HTN (hypertension)    Hyperlipidemia    Lumbar herniated disc 07/07/2021   MDD (major depressive disorder), recurrent, in partial remission (HCC)    MGUS (monoclonal gammopathy of  unknown significance) 03/15/2015   Migraine headache    Monoclonal (M) protein disease, multiple 'M' protein    Obstructive sleep apnea 01/03/2016   ESS 4, AHI 51/hour, RDI 56/hour, no REM.  Minimum saturation 79%-Dr. Chaya   Osteoarthritis    generalized   PONV (postoperative nausea and vomiting)    Pre-diabetes    Improved with weigth loss    Prediabetes    Tobacco dependence    Vision abnormalities      Past Surgical History:  Procedure Laterality Date   ANTERIOR AND POSTERIOR REPAIR N/A 10/16/2015   Procedure: ANTERIOR (CYSTOCELE) AND POSTERIOR REPAIR (RECTOCELE);  Surgeon: Bobie FORBES Cathlyn JAYSON Nikki, MD;  Location: [facility];  Service: Gynecology;  Laterality: N/A;   APPENDECTOMY  1974   BLADDER SUSPENSION N/A 10/16/2015   Procedure: TRANSVAGINAL TAPE (TVT) PROCEDURE exact midurethral sling;  Surgeon: Bobie FORBES Cathlyn JAYSON Nikki, MD;  Location: [facility];  Service: Gynecology;  Laterality: N/A;   BREAST BIOPSY     negative x 2    BREAST EXCISIONAL BIOPSY Left 1998   BREAST EXCISIONAL BIOPSY Right 1986   BUNIONECTOMY     w/hammer toe repair   CARPAL TUNNEL RELEASE Right    CERVICAL DISC SURGERY  12/2004   CT CTA CORONARY W/CA SCORE W/CM &/OR WO/CM  04/26/2019   Coronary calcium score 0.  No evidence of CAD.   CYSTO N/A 10/16/2015   Procedure: JONATHON;  Surgeon: Bobie FORBES Cathlyn JAYSON Nikki, MD;  Location: [facility];  Service: Gynecology;  Laterality: N/A;   CYSTOSCOPY N/A 02/26/2016   Procedure: CYSTOSCOPY;  Surgeon: Bobie FORBES Cathlyn JAYSON Nikki, MD;  Location: [facility];  Service: Gynecology;  Laterality: N/A;   KNEE ARTHROSCOPY     right knee x 2   MENISCUS REPAIR     right knee x 1   TENS     upper and lower back   TONSILECTOMY, ADENOIDECTOMY, BILATERAL MYRINGOTOMY AND TUBES     TOTAL KNEE ARTHROPLASTY Right 04/09/2022   Procedure: TOTAL KNEE ARTHROPLASTY;  Surgeon: Duwayne Purchase, MD;  Location: [facility];  Service: Orthopedics;  Laterality: Right;   TOTAL VAGINAL HYSTERECTOMY  05/2004   adenomyosis, prolapse--ovaries remain   TRANSTHORACIC ECHOCARDIOGRAM  05/17/2019   EF 60 to 65%.  No R WMA.  Normal diastolic numbers.  No MR or MS.  No comment on MVP.  Mild aortic sclerosis with no stenosis.  Normal RAP.   TRANSVAGINAL TAPE (TVT) REMOVAL N/A 02/26/2016   Procedure: Lysis of mid urethral sling, Anterior Colporrhaphy, Cystoscopy ;   Surgeon: Bobie FORBES Cathlyn JAYSON Nikki, MD;  Location: [facility];  Service: Gynecology;  Laterality: N/A;  first case/ move first case to follow this. Need 1 hour/    TUBAL LIGATION      Family History  Problem Relation Age of Onset   Heart failure Mother        congestive-presumably nonischemic cardiomyopathy   Diabetes Mellitus II Mother    Hypertension Mother    Coronary artery disease Father 61       MI   Diabetes Father    Heart attack Father 68       x2   Stroke Father 67   Hypertension Father    Hyperlipidemia Father    Diabetes Mellitus II Father    Cancer Brother 44       lung cancer   Cancer Brother        metastasized, unknown origin   Diabetes Brother  Colon cancer Paternal Grandmother    Breast cancer Cousin    Breast cancer Cousin      Social Connections: Unknown (11/17/2021)   Received from [facility], [facility]   Social Network    Social Network: Not on file      Current Outpatient Medications:    acetaminophen  (TYLENOL ) 500 MG tablet, Take 1,000 mg by mouth daily as needed for mild pain., Disp: , Rfl:    acetaZOLAMIDE  (DIAMOX ) 250 MG tablet, Take 1 tablet twice daily, Disp: 180 tablet, Rfl: 1   ALPRAZolam  (XANAX ) 0.5 MG tablet, Take 0.25 mg by mouth at bedtime., Disp: , Rfl:    atorvastatin (LIPITOR) 20 MG tablet, Take 20 mg by mouth daily., Disp: , Rfl:    azelastine  (ASTELIN ) 0.1 % nasal spray, Place 2 sprays into both nostrils 2 (two) times daily as needed for rhinitis or allergies. Use in each nostril as directed for congestion, Disp: 30 mL, Rfl: 12   brexpiprazole  (REXULTI ) 2 MG TABS tablet, Take 2 mg by mouth at bedtime., Disp: , Rfl:    Cholecalciferol  (VITAMIN D -3) 5000 UNITS TABS, Take 5,000 Units by mouth daily., Disp: , Rfl:    desvenlafaxine (PRISTIQ) 100 MG 24 hr tablet, Take 100 mg by mouth daily., Disp: , Rfl:    ibuprofen  (ADVIL ,MOTRIN ) 600 MG tablet, Take 1 tablet (600 mg total) by mouth every 6 (six) hours as needed (mild pain)., Disp:  30 tablet, Rfl: 0   losartan  (COZAAR ) 50 MG tablet, Take 25 mg by mouth daily with supper., Disp: , Rfl:    magnesium  oxide (MAG-OX) 400 MG tablet, Take 400 mg by mouth at bedtime., Disp: , Rfl:    metoprolol  succinate (TOPROL -XL) 100 MG 24 hr tablet, Take 100 mg by mouth daily., Disp: , Rfl:    omeprazole (PRILOSEC) 20 MG capsule, Take 20 mg by mouth every morning., Disp: , Rfl:    sodium chloride  (OCEAN) 0.65 % SOLN nasal spray, Place 2 sprays into both nostrils every 4 (four) hours as needed for congestion., Disp: 30 mL, Rfl: 2   triamcinolone  (NASACORT ) 55 MCG/ACT AERO nasal inhaler, Place 2 sprays into the nose daily as needed (Allergies and congestion)., Disp: , Rfl:    Physical Exam:   BP (!) 144/86 (BP Location: Left Arm, Patient Position: Sitting, Cuff Size: Normal)   Pulse 64   Resp 19   Ht 5' 2 (1.575 m)   Wt 141 lb (64 kg)   LMP 03/07/2004 (Approximate)   SpO2 98%   BMI 25.79 kg/m   Salient findings:  CN II-XII intact  Bilateral EAC clear and TM intact with well pneumatized middle ear spaces Anterior rhinoscopy: Septum moderate dev left; bilateral inferior turbinates with modest hypertrophy. Nasal endoscopy was indicated to better evaluate the nose and paranasal sinuses, given the patient's history and exam findings, and is detailed below. No significant nasal valve collapse on mod cottle neg No lesions of oral cavity/oropharynx; tonsils 1/1, tongue friedman 2 No obviously palpable neck masses/lymphadenopathy/thyromegaly No respiratory distress or stridor  Seprately Identifiable Procedures:  PROCEDURE: Bilateral Diagnostic Rigid Nasal Endoscopy Pre-procedure diagnosis: nasal obstruction, rule out structural cause Post-procedure diagnosis: same Indication: See pre-procedure diagnosis and physical exam above Complications: None apparent EBL: 0 mL Anesthesia: Lidocaine  4% and topical decongestant was topically sprayed in each nasal cavity  Description of Procedure:   Patient was identified. A rigid 30 degree endoscope was utilized to evaluate the sinonasal cavities, mucosa, sinus ostia and turbinates and septum.  Overall, signs  of mucosal inflammation are not noted - mild dryness Also noted are mod left septal deviation, bilateral ITH.  No mucopurulence, polyps, or masses noted.   Right Middle meatus: clear Right SE Recess: clear Left MM: clear Left SE Recess: clear   Photodocumentation was obtained.  CPT CODE -- 68768 - Mod 25   Impression & Plans:  [name] is a 68 y.o. female with:  1. Nasal obstruction   2. Hypertrophy of both inferior nasal turbinates   3. Nasal congestion   4. Nasal septal deviation   5. OSA (obstructive sleep apnea)   6. Intolerance of continuous positive airway pressure (CPAP) ventilation   7. Tobacco use disorder    Given the patient's tobacco use, I also discussed cessation and options for cessation. Counseled patient on the dangers of tobacco use, advised patient to stop use. Patient is not ready to quit. Total time spent with this was 3 minutes  Primary complaint is nasal obstruction and intolerance of CPAP - we discussed options - medical management (partially tried and failed), surgical options -- she is a good candidate for Inspire and we discussed this option but she wants to see if nasal surgery is an option; could certainly offer septo/turbs given her endo and symptoms, but would not improve OSA but just CPAP tolerance. Will try medical mgmt first - Continue nasacort  BID - Start Astelin  BID - Ocean spray q4h PRN for nasal humidification - f/u in 4 weeks - discuss septo/turbs at that time    See below regarding exact medications prescribed this encounter including dosages and route: Meds ordered this encounter  Medications   azelastine  (ASTELIN ) 0.1 % nasal spray    Sig: Place 2 sprays into both nostrils 2 (two) times daily as needed for rhinitis or allergies. Use in each nostril as directed for  congestion    Dispense:  30 mL    Refill:  12   sodium chloride  (OCEAN) 0.65 % SOLN nasal spray    Sig: Place 2 sprays into both nostrils every 4 (four) hours as needed for congestion.    Dispense:  30 mL    Refill:  2      Thank you for allowing me the opportunity to care for your patient. Please do not hesitate to contact me should you have any other questions.  Sincerely, Eldora Blanch, MD Otolarynoglogist (ENT), [facility] Phone: [phone] Fax: [phone]  08/02/2023, 8:01 PM   MDM:  Level 4 Complexity/Problems addressed: mod - multiple chronic problems Data complexity: mod - independent review of notes, labs; independent interpretation of testing (sleep study) - Morbidity: mod  - Prescription Drug prescribed or managed: yes

## 2023-07-21 NOTE — Telephone Encounter (Signed)
 Rescheduled appointment per provider away for a  conference. Patient is aware of the made appointment via voicemail and will be mailed an appointment reminder.

## 2023-07-21 NOTE — Patient Instructions (Addendum)
 Use nasacort two sprays twice daily each nostril Use astelin spray if nasal congestion or dripping is significant up two sprays twice daily as needed Use ocean spray (saline spray) for congestion every 4 hours as needed

## 2023-08-03 DIAGNOSIS — Z961 Presence of intraocular lens: Secondary | ICD-10-CM | POA: Diagnosis not present

## 2023-08-03 DIAGNOSIS — H16101 Unspecified superficial keratitis, right eye: Secondary | ICD-10-CM | POA: Diagnosis not present

## 2023-08-03 DIAGNOSIS — H35361 Drusen (degenerative) of macula, right eye: Secondary | ICD-10-CM | POA: Diagnosis not present

## 2023-08-19 ENCOUNTER — Telehealth (INDEPENDENT_AMBULATORY_CARE_PROVIDER_SITE_OTHER): Payer: Self-pay | Admitting: Otolaryngology

## 2023-08-19 NOTE — Telephone Encounter (Signed)
Left vm to confirm appt and address for 08/20/2023.

## 2023-08-20 ENCOUNTER — Ambulatory Visit (INDEPENDENT_AMBULATORY_CARE_PROVIDER_SITE_OTHER): Payer: 59 | Admitting: Otolaryngology

## 2023-08-20 ENCOUNTER — Encounter (INDEPENDENT_AMBULATORY_CARE_PROVIDER_SITE_OTHER): Payer: Self-pay

## 2023-08-20 VITALS — BP 128/78

## 2023-08-20 DIAGNOSIS — J343 Hypertrophy of nasal turbinates: Secondary | ICD-10-CM | POA: Diagnosis not present

## 2023-08-20 DIAGNOSIS — Z789 Other specified health status: Secondary | ICD-10-CM

## 2023-08-20 DIAGNOSIS — Z91198 Patient's noncompliance with other medical treatment and regimen for other reason: Secondary | ICD-10-CM | POA: Diagnosis not present

## 2023-08-20 DIAGNOSIS — J342 Deviated nasal septum: Secondary | ICD-10-CM | POA: Diagnosis not present

## 2023-08-20 DIAGNOSIS — J3489 Other specified disorders of nose and nasal sinuses: Secondary | ICD-10-CM | POA: Diagnosis not present

## 2023-08-20 DIAGNOSIS — G4733 Obstructive sleep apnea (adult) (pediatric): Secondary | ICD-10-CM

## 2023-08-20 NOTE — Progress Notes (Unsigned)
 Dear Dr. Cliffton Asters, Here is my assessment for our mutual patient, [name]. Thank you for allowing me the opportunity to care for your patient. Please do not hesitate to contact me should you have any other questions. Sincerely, Dr. Jovita Kussmaul  Otolaryngology Clinic Note Referring provider: Dr. Cliffton Asters HPI:  [name] is a 68 y.o. female kindly referred by Dr. Cliffton Asters for evaluation of sinus issues and CPAP intolerance.  Initial visit (07/2023): Patient reports that she gets so congested some times that she cannot breathe through the night. She also reports some mucoid rhinorrhea which is bothersome. This is significantly bothersome for her because she uses a nasal CPAP and can't tolerate it due to nasal obstruction. It is disrupting her quality of life. Patient reports that this is on both sides, but more apparent when she lays on the right side. Also reports trouble breathing out of the nose during the day. She has had vivaer done with Dr. Marene Lenz in Feb 2023 and it did not help.   She denies any history of frequent sinus infections -- last one reported several years ago which is reported as congestion, facial pressure, discolored drainage. No pain or pressure in face, discolored drainage. Smell is down some, however.   She has thought about inspire but she'd prefer nasal surgery first. Cannot tolerate CPAP masks and pulls it off even though she has tried several and changed settings.  No recent antibiotics or steroids.  She does report season allergies - uses nasacort, and it helps some. Tried allegra for several years, and it helped some.Has tried breathe rite strips, no significant help.Marland Kitchen   No prior allergy testing or sinus CT.  --------------------------------------------------------- 08/20/2023 Seen in follow up. She has tried nasal sprays but no improvement; she has considered her options and given how bothersome this is for her in general (obstruction even during day), she  would like to proceed with septoplasty after discussion. Continues to smoke.   H&N Surgery: Vivaer 2023, ACDF (several years ago) Personal or FHx of bleeding dz or anesthesia difficulty: slow to wake up but no complications  GLP-1: Takes Semaglutide - weight loss AP/AC: no  PMHx: HTN, prior MVP (but recently said not present by Cardiology), OSA, MGUS, Depression, Periodic Paralysis, OSA on CPAP (AHI 51)  Tobacco: vapes and uses 7 cigarettes/day. Lives in [place], [place]  Independent Review of Additional Tests or Records:  Dr. Marene Lenz (ENT) 10/08/2021 and 08/08/2021: noted nasal congestion and OSA on CPAP, using nasacort and decongestant; no formal allergy testing, no CRS sx; Dx: Septal deviation, b/l ITH, NVR; then underwent Vivaer; no significant improvement [facility] (06/16/2023): referral notes reviewed and uploaded or available in chart in media tab: eval for Inspire and sleep issues, intolerance to CPAP - significant nasal congestion at night, using nasal mask + mouth tape ut has to remove CPAP at 3 AM; using nasacort, Noted 100% use of CPAP; Dx: CPAP intolerance, ref ENT for Inspire or septoplasty Sleep study 12/2015 reviewed independently - AHI 51.2, O2 nadir 79%, no central apneas Labs 08/21/2022 CBC and CMP: WBC 9.8, Plt 354, Hgb 13, CMP Na 140, No kidney disease  PMH/Meds/All/SocHx/FamHx/ROS:   Past Medical History:  Diagnosis Date   Anxiety    Aortic atherosclerosis (HCC)    Seen on imaging studies   Cervical disc disease    Complication of anesthesia    prolonged sedation   COPD (chronic obstructive pulmonary disease) with emphysema (HCC)    Family history of adverse reaction to  anesthesia    Mother N&V   GERD (gastroesophageal reflux disease)    HTN (hypertension)    Hyperlipidemia    Lumbar herniated disc 07/07/2021   MDD (major depressive disorder), recurrent, in partial remission (HCC)    MGUS (monoclonal gammopathy of unknown significance) 03/15/2015    Migraine headache    Monoclonal (M) protein disease, multiple 'M' protein    Obstructive sleep apnea 01/03/2016   ESS 4, AHI 51/hour, RDI 56/hour, no REM.  Minimum saturation 79%-Dr. Allie Dimmer   Osteoarthritis    generalized   PONV (postoperative nausea and vomiting)    Pre-diabetes    Improved with weigth loss   Prediabetes    Tobacco dependence    Vision abnormalities      Past Surgical History:  Procedure Laterality Date   ANTERIOR AND POSTERIOR REPAIR N/A 10/16/2015   Procedure: ANTERIOR (CYSTOCELE) AND POSTERIOR REPAIR (RECTOCELE);  Surgeon: Patton Salles, MD;  Location: [facility];  Service: Gynecology;  Laterality: N/A;   APPENDECTOMY  1974   BLADDER SUSPENSION N/A 10/16/2015   Procedure: TRANSVAGINAL TAPE (TVT) PROCEDURE exact midurethral sling;  Surgeon: Patton Salles, MD;  Location: [facility];  Service: Gynecology;  Laterality: N/A;   BREAST BIOPSY     negative x 2    BREAST EXCISIONAL BIOPSY Left 1998   BREAST EXCISIONAL BIOPSY Right 1986   BUNIONECTOMY     w/hammer toe repair   CARPAL TUNNEL RELEASE Right    CERVICAL DISC SURGERY  12/2004   CT CTA CORONARY W/CA SCORE W/CM &/OR WO/CM  04/26/2019   Coronary calcium score 0.  No evidence of CAD.   CYSTO N/A 10/16/2015   Procedure: Clearnce Sorrel;  Surgeon: Patton Salles, MD;  Location: [facility];  Service: Gynecology;  Laterality: N/A;   CYSTOSCOPY N/A 02/26/2016   Procedure: CYSTOSCOPY;  Surgeon: Patton Salles, MD;  Location: [facility];  Service: Gynecology;  Laterality: N/A;   KNEE ARTHROSCOPY     right knee x 2   MENISCUS REPAIR     right knee x 1   TENS     upper and lower back   TONSILECTOMY, ADENOIDECTOMY, BILATERAL MYRINGOTOMY AND TUBES     TOTAL KNEE ARTHROPLASTY Right 04/09/2022   Procedure: TOTAL KNEE ARTHROPLASTY;  Surgeon: Jene Every, MD;  Location: [facility];  Service: Orthopedics;  Laterality: Right;   TOTAL VAGINAL HYSTERECTOMY  05/2004   adenomyosis, prolapse--ovaries remain    TRANSTHORACIC ECHOCARDIOGRAM  05/17/2019   EF 60 to 65%.  No R WMA.  Normal diastolic numbers.  No MR or MS.  No comment on MVP.  Mild aortic sclerosis with no stenosis.  Normal RAP.   TRANSVAGINAL TAPE (TVT) REMOVAL N/A 02/26/2016   Procedure: Lysis of mid urethral sling, Anterior Colporrhaphy, Cystoscopy ;  Surgeon: Patton Salles, MD;  Location: [facility];  Service: Gynecology;  Laterality: N/A;  first case/ move first case to follow this. Need 1 hour/    TUBAL LIGATION      Family History  Problem Relation Age of Onset   Heart failure Mother        congestive-presumably nonischemic cardiomyopathy   Diabetes Mellitus II Mother    Hypertension Mother    Coronary artery disease Father 43       MI   Diabetes Father    Heart attack Father 63       x2   Stroke Father 88   Hypertension Father  Hyperlipidemia Father    Diabetes Mellitus II Father    Cancer Brother 57       lung cancer   Cancer Brother        metastasized, unknown origin   Diabetes Brother    Colon cancer Paternal Grandmother    Breast cancer Cousin    Breast cancer Cousin      Social Connections: Unknown (11/17/2021)   [facility]   Social Network    Social Network: Not on file      Current Outpatient Medications:    acetaminophen (TYLENOL) 500 MG tablet, Take 1,000 mg by mouth daily as needed for mild pain., Disp: , Rfl:    acetaZOLAMIDE (DIAMOX) 250 MG tablet, Take 1 tablet twice daily, Disp: 180 tablet, Rfl: 1   ALPRAZolam (XANAX) 0.5 MG tablet, Take 0.25 mg by mouth at bedtime., Disp: , Rfl:    atorvastatin (LIPITOR) 20 MG tablet, Take 20 mg by mouth daily., Disp: , Rfl:    azelastine (ASTELIN) 0.1 % nasal spray, Place 2 sprays into both nostrils 2 (two) times daily as needed for rhinitis or allergies. Use in each nostril as directed for congestion, Disp: 30 mL, Rfl: 12   brexpiprazole (REXULTI) 2 MG TABS tablet, Take 2 mg by mouth at bedtime., Disp: , Rfl:     Cholecalciferol (VITAMIN D-3) 5000 UNITS TABS, Take 5,000 Units by mouth daily., Disp: , Rfl:    desvenlafaxine (PRISTIQ) 100 MG 24 hr tablet, Take 100 mg by mouth daily., Disp: , Rfl:    ibuprofen (ADVIL,MOTRIN) 600 MG tablet, Take 1 tablet (600 mg total) by mouth every 6 (six) hours as needed (mild pain)., Disp: 30 tablet, Rfl: 0   losartan (COZAAR) 50 MG tablet, Take 25 mg by mouth daily with supper., Disp: , Rfl:    magnesium oxide (MAG-OX) 400 MG tablet, Take 400 mg by mouth at bedtime., Disp: , Rfl:    metoprolol succinate (TOPROL-XL) 100 MG 24 hr tablet, Take 100 mg by mouth daily., Disp: , Rfl:    omeprazole (PRILOSEC) 20 MG capsule, Take 20 mg by mouth every morning., Disp: , Rfl:    sodium chloride (OCEAN) 0.65 % SOLN nasal spray, Place 2 sprays into both nostrils every 4 (four) hours as needed for congestion., Disp: 30 mL, Rfl: 2   triamcinolone (NASACORT) 55 MCG/ACT AERO nasal inhaler, Place 2 sprays into the nose daily as needed (Allergies and congestion)., Disp: , Rfl:    Physical Exam:   BP 128/78 (BP Location: Right Arm, Patient Position: Sitting, Cuff Size: Normal)   LMP 03/07/2004 (Approximate)   Salient findings:  CN II-XII intact  Bilateral EAC clear and TM intact with well pneumatized middle ear spaces Anterior rhinoscopy: Septum moderate dev left; bilateral inferior turbinates with modest hypertrophy, mild/modest nasal cavit ydryness No significant nasal valve collapse on mod cottle neg No lesions of oral cavity/oropharynx; tonsils 1/1, tongue friedman 2 No obviously palpable neck masses/lymphadenopathy/thyromegaly No respiratory distress or stridor  Seprately Identifiable Procedures:  PROCEDURE: Bilateral Diagnostic Rigid Nasal Endoscopy (Prior not today) Description of Procedure:  Patient was identified. A rigid 30 degree endoscope was utilized to evaluate the sinonasal cavities, mucosa, sinus ostia and turbinates and septum.  Overall, signs of mucosal inflammation  are not noted - mild dryness Also noted are mod left septal deviation, bilateral ITH.  No mucopurulence, polyps, or masses noted.   Right Middle meatus: clear Right SE Recess: clear Left MM: clear Left SE Recess: clear  Photodocumentation was obtained.    Impression & Plans:  Raniyah Curenton is a 68 y.o. female with:  1. Nasal septal deviation   2. Hypertrophy of both inferior nasal turbinates   3. Nasal obstruction   4. OSA (obstructive sleep apnea)   5. Intolerance of continuous positive airway pressure (CPAP) ventilation    Primary complaint is nasal obstruction and intolerance of CPAP - we discussed options - medical management (tried and failed), surgical options -- she is a good candidate for Inspire and we discussed this option but she does not wish to do this. We also discussed nasal surgery (septo/turbs) for CPAP tolerance and obstruction. She would like to proceed.  We discussed the goals of septoplasty and turbinate reduction, and expectations for postoperative management. Will plan to leave splints in place, and removal was also discussed. We also discussed nasal obstruction post-operatively until splints in place and pain management.  We discussed R/B/A including pain, infection, bleeding (~5% risk of operative visit for control), persistent symptoms, need for revision surgery, and other risks including damage to surrounding structures, septal perforation, and injury to skull base with risk of CSF leak, anesthetic complications, among others.  We discussed use of nasal saline spray and nasal saline irrigations post-operatively We also discussed use of intranasal steroid post-operatively until healing occurs Patient understands and is ready to proceed. - Continue nasacort BID - Continue Astelin BID - Ocean spray q4h PRN for nasal humidification - Will hold CPAP 7 days after surgery - Will need to hold GLP 1 1 week prior to surgery - Again discussed w/pt tobacco  cessation, she is not interested but advised at least periop cessation - f/u POD 5  See below regarding exact medications prescribed this encounter including dosages and route: No orders of the defined types were placed in this encounter.     Thank you for allowing me the opportunity to care for your patient. Please do not hesitate to contact me should you have any other questions.  Sincerely, Jovita Kussmaul, MD Otolaryngologist (ENT), [facility] Phone: [phone] Fax: [phone]  08/21/2023, 9:21 AM   MDM:  Level 4 - 99214 Complexity/Problems addressed: mod - multiple chronic problems Data complexity: low - Morbidity: mod - decision for surgery - Prescription Drug prescribed or managed: no

## 2023-08-21 ENCOUNTER — Inpatient Hospital Stay: Payer: Medicare Other | Attending: Hematology and Oncology

## 2023-08-21 ENCOUNTER — Other Ambulatory Visit: Payer: Self-pay

## 2023-08-21 DIAGNOSIS — D472 Monoclonal gammopathy: Secondary | ICD-10-CM | POA: Diagnosis not present

## 2023-08-21 LAB — CBC WITH DIFFERENTIAL/PLATELET
Abs Immature Granulocytes: 0.01 10*3/uL (ref 0.00–0.07)
Basophils Absolute: 0.1 10*3/uL (ref 0.0–0.1)
Basophils Relative: 1 %
Eosinophils Absolute: 0.2 10*3/uL (ref 0.0–0.5)
Eosinophils Relative: 2 %
HCT: 45.5 % (ref 36.0–46.0)
Hemoglobin: 14.8 g/dL (ref 12.0–15.0)
Immature Granulocytes: 0 %
Lymphocytes Relative: 47 %
Lymphs Abs: 4.3 10*3/uL — ABNORMAL HIGH (ref 0.7–4.0)
MCH: 30.3 pg (ref 26.0–34.0)
MCHC: 32.5 g/dL (ref 30.0–36.0)
MCV: 93.2 fL (ref 80.0–100.0)
Monocytes Absolute: 0.6 10*3/uL (ref 0.1–1.0)
Monocytes Relative: 6 %
Neutro Abs: 4 10*3/uL (ref 1.7–7.7)
Neutrophils Relative %: 44 %
Platelets: 335 10*3/uL (ref 150–400)
RBC: 4.88 MIL/uL (ref 3.87–5.11)
RDW: 13.2 % (ref 11.5–15.5)
WBC: 9.1 10*3/uL (ref 4.0–10.5)
nRBC: 0 % (ref 0.0–0.2)

## 2023-08-21 LAB — COMPREHENSIVE METABOLIC PANEL
ALT: 11 U/L (ref 0–44)
AST: 13 U/L — ABNORMAL LOW (ref 15–41)
Albumin: 4.6 g/dL (ref 3.5–5.0)
Alkaline Phosphatase: 93 U/L (ref 38–126)
Anion gap: 6 (ref 5–15)
BUN: 12 mg/dL (ref 8–23)
CO2: 26 mmol/L (ref 22–32)
Calcium: 9.8 mg/dL (ref 8.9–10.3)
Chloride: 105 mmol/L (ref 98–111)
Creatinine, Ser: 0.78 mg/dL (ref 0.44–1.00)
GFR, Estimated: >60 mL/min (ref >60)
Glucose, Bld: 66 mg/dL — ABNORMAL LOW (ref 70–99)
Potassium: 4.3 mmol/L (ref 3.5–5.1)
Sodium: 137 mmol/L (ref 135–145)
Total Bilirubin: 0.4 mg/dL (ref 0.0–1.2)
Total Protein: 7.5 g/dL (ref 6.5–8.1)

## 2023-08-24 LAB — KAPPA/LAMBDA LIGHT CHAINS
Kappa free light chain: 22.2 mg/L — ABNORMAL HIGH (ref 3.3–19.4)
Kappa, lambda light chain ratio: 1.61 (ref 0.26–1.65)
Lambda free light chains: 13.8 mg/L (ref 5.7–26.3)

## 2023-08-25 LAB — MULTIPLE MYELOMA PANEL, SERUM
Albumin SerPl Elph-Mcnc: 4.1 g/dL (ref 2.9–4.4)
Albumin/Glob SerPl: 1.5 (ref 0.7–1.7)
Alpha 1: 0.2 g/dL (ref 0.0–0.4)
Alpha2 Glob SerPl Elph-Mcnc: 0.8 g/dL (ref 0.4–1.0)
B-Globulin SerPl Elph-Mcnc: 0.9 g/dL (ref 0.7–1.3)
Gamma Glob SerPl Elph-Mcnc: 0.9 g/dL (ref 0.4–1.8)
Globulin, Total: 2.8 g/dL (ref 2.2–3.9)
IgA: 138 mg/dL (ref 87–352)
IgG (Immunoglobin G), Serum: 966 mg/dL (ref 586–1602)
IgM (Immunoglobulin M), Srm: 158 mg/dL (ref 26–217)
M Protein SerPl Elph-Mcnc: 0.3 g/dL — ABNORMAL HIGH
Total Protein ELP: 6.9 g/dL (ref 6.0–8.5)

## 2023-08-31 ENCOUNTER — Ambulatory Visit: Payer: Medicare Other | Admitting: Hematology and Oncology

## 2023-09-03 ENCOUNTER — Encounter: Payer: Self-pay | Admitting: Hematology and Oncology

## 2023-09-03 ENCOUNTER — Inpatient Hospital Stay (HOSPITAL_BASED_OUTPATIENT_CLINIC_OR_DEPARTMENT_OTHER): Payer: Medicare Other | Admitting: Hematology and Oncology

## 2023-09-03 VITALS — BP 132/79 | HR 65 | Resp 18 | Ht 62.0 in | Wt 145.8 lb

## 2023-09-03 DIAGNOSIS — D472 Monoclonal gammopathy: Secondary | ICD-10-CM | POA: Diagnosis not present

## 2023-09-03 NOTE — Assessment & Plan Note (Addendum)
 So far, her myeloma panel is stable and she has no evidence of endorgan damage We discussed the natural history of MGUS I will see her once a year

## 2023-09-03 NOTE — Progress Notes (Signed)
  [facility] OFFICE PROGRESS NOTE  Patient Care Team: Laurann Montana, MD as PCP - General (Family Medicine) Marykay Lex, MD as PCP - Cardiology (Cardiology) Pollyann Savoy, MD as Consulting Physician (Rheumatology) Laurann Montana, MD as Attending Physician [facility])  Assessment & Plan MGUS (monoclonal gammopathy of unknown significance) So far, her myeloma panel is stable and she has no evidence of endorgan damage We discussed the natural history of MGUS I will see her once a year  No orders of the defined types were placed in this encounter.    Artis Delay, MD  INTERVAL HISTORY: she returns for surveillance follow-up for IgG kappa MGUS She has no new symptoms No recent infection or bone pain  PHYSICAL EXAMINATION: ECOG PERFORMANCE STATUS: 0 - Asymptomatic  Vitals:   09/03/23 1151  BP: 132/79  Pulse: 65  Resp: 18  SpO2: 100%   Filed Weights   09/03/23 1151  Weight: 145 lb 12.8 oz (66.1 kg)    Relevant data for this visit included recent labs, including myeloma panel was reviewed with the patient which showed no evidence of progression  SUMMARY OF ONCOLOGIC HISTORY:  She reports she still has persistent diffuse bone pain in her shoulders, low back, bilateral hips, bilateral knees. She recently has TENS unit implantation however has not had much improvement of her diffuse bone pain.  She has mild fatigue due to chronic bone pain. She had abnormal M spike detected with no evidence of lytic lesion. She is being observed.

## 2023-09-05 HISTORY — PX: NASAL SEPTOPLASTY W/ TURBINOPLASTY: SHX2070

## 2023-09-12 DIAGNOSIS — N390 Urinary tract infection, site not specified: Secondary | ICD-10-CM | POA: Diagnosis not present

## 2023-10-01 ENCOUNTER — Telehealth (INDEPENDENT_AMBULATORY_CARE_PROVIDER_SITE_OTHER): Payer: Self-pay

## 2023-10-01 NOTE — Telephone Encounter (Signed)
 Yes that is fine, ok to proceed with surgery

## 2023-10-01 NOTE — Telephone Encounter (Signed)
 Provider spoke to patient and informed her that she can continue with surgery.

## 2023-10-05 ENCOUNTER — Encounter (HOSPITAL_BASED_OUTPATIENT_CLINIC_OR_DEPARTMENT_OTHER): Payer: Self-pay

## 2023-10-05 ENCOUNTER — Ambulatory Visit (HOSPITAL_BASED_OUTPATIENT_CLINIC_OR_DEPARTMENT_OTHER): Admit: 2023-10-05 | Payer: Medicare Other

## 2023-10-05 ENCOUNTER — Telehealth (INDEPENDENT_AMBULATORY_CARE_PROVIDER_SITE_OTHER): Payer: Self-pay | Admitting: Otolaryngology

## 2023-10-05 DIAGNOSIS — J343 Hypertrophy of nasal turbinates: Secondary | ICD-10-CM | POA: Diagnosis not present

## 2023-10-05 DIAGNOSIS — J342 Deviated nasal septum: Secondary | ICD-10-CM | POA: Diagnosis not present

## 2023-10-05 DIAGNOSIS — R0981 Nasal congestion: Secondary | ICD-10-CM | POA: Diagnosis not present

## 2023-10-05 SURGERY — SEPTOPLASTY, NOSE, WITH NASAL TURBINATE REDUCTION
Anesthesia: Choice | Laterality: Bilateral

## 2023-10-05 MED ORDER — TRAMADOL HCL 50 MG PO TABS: 50.0000 mg | ORAL_TABLET | Freq: Four times a day (QID) | ORAL | 0 refills | Status: AC | PRN

## 2023-10-05 MED ORDER — SALINE SPRAY 0.65 % NA SOLN: 2.0000 | NASAL | 2 refills | Status: AC | PRN

## 2023-10-05 MED ORDER — IBUPROFEN 200 MG PO TABS: 400.0000 mg | ORAL_TABLET | Freq: Four times a day (QID) | ORAL | 0 refills | Status: AC | PRN

## 2023-10-05 MED ORDER — CEPHALEXIN 500 MG PO CAPS: 500.0000 mg | ORAL_CAPSULE | Freq: Two times a day (BID) | ORAL | 0 refills | Status: AC

## 2023-10-05 MED ORDER — ACETAMINOPHEN 500 MG PO TABS: 1000.0000 mg | ORAL_TABLET | Freq: Four times a day (QID) | ORAL | 0 refills | Status: AC | PRN

## 2023-10-05 NOTE — Telephone Encounter (Signed)
 ENT Note: Septoplasty and bilateral IT reduction done at [facility] on 10/05/2023. Doyle splints in place. No CPAP x7d Will prescribe tylenol, ibuprofen, and tramadol 50mg  PO q6h PRN #10 Will also Rx keflex 500mg  BID x5d F/u scheduled.

## 2023-10-09 ENCOUNTER — Ambulatory Visit (INDEPENDENT_AMBULATORY_CARE_PROVIDER_SITE_OTHER): Payer: Medicare Other | Admitting: Otolaryngology

## 2023-10-09 ENCOUNTER — Encounter (INDEPENDENT_AMBULATORY_CARE_PROVIDER_SITE_OTHER): Payer: Self-pay

## 2023-10-09 VITALS — BP 129/78 | HR 66 | Ht 62.0 in | Wt 140.0 lb

## 2023-10-09 DIAGNOSIS — J342 Deviated nasal septum: Secondary | ICD-10-CM

## 2023-10-09 DIAGNOSIS — J343 Hypertrophy of nasal turbinates: Secondary | ICD-10-CM

## 2023-10-09 DIAGNOSIS — Z9889 Other specified postprocedural states: Secondary | ICD-10-CM

## 2023-10-09 DIAGNOSIS — G4733 Obstructive sleep apnea (adult) (pediatric): Secondary | ICD-10-CM

## 2023-10-09 DIAGNOSIS — J3489 Other specified disorders of nose and nasal sinuses: Secondary | ICD-10-CM

## 2023-10-09 NOTE — Progress Notes (Signed)
 S/p Setoplasty/turbinate reduction on 10/05/2023 S: Doing well, nasal breathing is much improved after split removal. No pain, no fevers, no bleeding.  O: Doyle splints in place, removed; after removal, no septal hematoma noted; Able to visualize axilla of MT bilaterally; tubinates reduced; no epistaxis. No evidence ofinfection A/P: 68 y.o. F w/ Septal deviation and b/l ITH s/p septoplasty and b/l ITH. Doing well; breathing improved Will do daily nasal rinses; continue intranasal steroid F/u 1 month

## 2023-10-26 DIAGNOSIS — R300 Dysuria: Secondary | ICD-10-CM | POA: Diagnosis not present

## 2023-10-28 NOTE — Telephone Encounter (Signed)
 Error

## 2023-11-02 ENCOUNTER — Telehealth: Payer: Self-pay | Admitting: Neurology

## 2023-11-02 DIAGNOSIS — G723 Periodic paralysis: Secondary | ICD-10-CM

## 2023-11-02 MED ORDER — ACETAZOLAMIDE 250 MG PO TABS: ORAL_TABLET | ORAL | 0 refills | Status: DC

## 2023-11-02 NOTE — Telephone Encounter (Signed)
 Pt is requesting a refill for acetaZOLAMIDE  (DIAMOX ) 250 MG tablet .  Pharmacy: [facility] [phone]

## 2023-11-02 NOTE — Telephone Encounter (Signed)
 Pt last seen 01/20/23 and next f/u 01/20/24. E-scribed refill

## 2023-11-09 ENCOUNTER — Ambulatory Visit (INDEPENDENT_AMBULATORY_CARE_PROVIDER_SITE_OTHER): Admitting: Otolaryngology

## 2023-11-09 ENCOUNTER — Encounter (INDEPENDENT_AMBULATORY_CARE_PROVIDER_SITE_OTHER): Payer: Self-pay

## 2023-11-09 VITALS — BP 125/80 | HR 70 | Ht 62.0 in | Wt 140.0 lb

## 2023-11-09 DIAGNOSIS — J343 Hypertrophy of nasal turbinates: Secondary | ICD-10-CM | POA: Diagnosis not present

## 2023-11-09 DIAGNOSIS — H903 Sensorineural hearing loss, bilateral: Secondary | ICD-10-CM

## 2023-11-09 DIAGNOSIS — J3489 Other specified disorders of nose and nasal sinuses: Secondary | ICD-10-CM

## 2023-11-09 DIAGNOSIS — J342 Deviated nasal septum: Secondary | ICD-10-CM | POA: Diagnosis not present

## 2023-11-09 DIAGNOSIS — H6122 Impacted cerumen, left ear: Secondary | ICD-10-CM

## 2023-11-09 NOTE — Patient Instructions (Signed)
 I have ordered an imaging study for you to complete prior to your next visit. Please call [facility] Scheduling at [phone] to schedule your imaging if you have not received a call within 24 hours. If you are unable to complete your imaging study prior to your next scheduled visit please call our office to let us know.

## 2023-11-09 NOTE — Progress Notes (Addendum)
 Dear Dr. Camilo Cella, Here is my assessment for our mutual patient, [name]. Thank you for allowing me the opportunity to care for your patient. Please do not hesitate to contact me should you have any other questions. Sincerely, Dr. Milon Aloe  Otolaryngology Clinic Note Referring provider: Dr. Camilo Cella HPI:  [name] is a 68 y.o. female kindly referred by Dr. Camilo Cella for evaluation of sinus issues and CPAP intolerance.  Initial visit (07/2023): Patient reports that she gets so congested some times that she cannot breathe through the night. She also reports some mucoid rhinorrhea which is bothersome. This is significantly bothersome for her because she uses a nasal CPAP and can't tolerate it due to nasal obstruction. It is disrupting her quality of life. Patient reports that this is on both sides, but more apparent when she lays on the right side. Also reports trouble breathing out of the nose during the day. She has had vivaer done with Dr. Westley Hammers in Feb 2023 and it did not help.   She denies any history of frequent sinus infections -- last one reported several years ago which is reported as congestion, facial pressure, discolored drainage. No pain or pressure in face, discolored drainage. Smell is down some, however.   She has thought about inspire but she'd prefer nasal surgery first. Cannot tolerate CPAP masks and pulls it off even though she has tried several and changed settings.  No recent antibiotics or steroids.  She does report season allergies - uses nasacort , and it helps some. Tried allegra for several years, and it helped some.Has tried breathe rite strips, no significant help.Aaron Aas   No prior allergy testing or sinus CT.  --------------------------------------------------------- 08/20/2023 Seen in follow up. She has tried nasal sprays but no improvement; she has considered her options and given how bothersome this is for her in general (obstruction even during day), she  would like to proceed with septoplasty after discussion. Continues to smoke.  --------------------------------------------------------- 11/09/2023 She is doing well from nasal standpoint - breathing improved, also improved from rhinorrhea standpoint. She does wish to address an ear concern -- left ear hearing loss - gradual, used them for 5 years but feels like hearing is going down. Intermittent fluttering, fullness Patient denies: ear pain, vertigo, drainage, tinnitus Patient additionally denies: deep pain in ear canal, eustachian tube symptoms such as popping/crackling, sensitive to pressure changes Patient also denies barotrauma, vestibular suppressant use, ototoxic medication use Prior ear surgery: no She did have a HT as below, which showed asymmetric SNHL ----------------------------------------------------------------  H&N Surgery: Vivaer 2023, ACDF (several years ago) Personal or FHx of bleeding dz or anesthesia difficulty: slow to wake up but no complications  GLP-1: Takes Semaglutide - weight loss AP/AC: no  PMHx: HTN, prior MVP (but recently said not present by Cardiology), OSA, MGUS, Depression, Periodic Paralysis, OSA on CPAP (AHI 51)  Tobacco: vapes and uses 7 cigarettes/day. Lives in [place], [place]  Independent Review of Additional Tests or Records:  Dr. Westley Hammers (ENT) 10/08/2021 and 08/08/2021: noted nasal congestion and OSA on CPAP, using nasacort  and decongestant; no formal allergy testing, no CRS sx; Dx: Septal deviation, b/l ITH, NVR; then underwent Vivaer; no significant improvement [facility] (06/16/2023): referral notes reviewed and uploaded or available in chart in media tab: eval for Inspire and sleep issues, intolerance to CPAP - significant nasal congestion at night, using nasal mask + mouth tape ut has to remove CPAP at 3 AM; using nasacort , Noted 100% use of CPAP; Dx: CPAP intolerance,  ref ENT for Inspire or septoplasty Sleep study 12/2015 reviewed  independently - AHI 51.2, O2 nadir 79%, no central apneas Labs 08/21/2022 CBC and CMP: WBC 9.8, Plt 354, Hgb 13, CMP Na 140, No kidney disease 09/2023 Outside Audiogram ([place]) was independently reviewed and interpreted by me and it reveals Bilateral downsloping (mild/borderline to severe SNHL) with slight asymmetry on pure tones but WRT 75% AS and 100% AD 65 and 70dB HL  SNHL= Sensorineural hearing loss   PMH/Meds/All/SocHx/FamHx/ROS:   Past Medical History:  Diagnosis Date   Anxiety    Aortic atherosclerosis (HCC)    Seen on imaging studies   Cervical disc disease    Complication of anesthesia    prolonged sedation   COPD (chronic obstructive pulmonary disease) with emphysema (HCC)    Family history of adverse reaction to anesthesia    Mother N&V   GERD (gastroesophageal reflux disease)    HTN (hypertension)    Hyperlipidemia    Lumbar herniated disc 07/07/2021   MDD (major depressive disorder), recurrent, in partial remission (HCC)    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  Minimum saturation 79%-Dr. Karlene Overcast   Osteoarthritis    generalized   PONV (postoperative nausea and vomiting)    Pre-diabetes    Improved with weigth loss   Prediabetes    Tobacco dependence    Vision abnormalities      Past Surgical History:  Procedure Laterality Date   ANTERIOR AND POSTERIOR REPAIR N/A 10/16/2015   Procedure: ANTERIOR (CYSTOCELE) AND POSTERIOR REPAIR (RECTOCELE);  Surgeon: Greta Leatherwood, MD;  Location: [facility];  Service: Gynecology;  Laterality: N/A;   APPENDECTOMY  1974   BLADDER SUSPENSION N/A 10/16/2015   Procedure: TRANSVAGINAL TAPE (TVT) PROCEDURE exact midurethral sling;  Surgeon: Greta Leatherwood, MD;  Location: [facility];  Service: Gynecology;  Laterality: N/A;   BREAST  BIOPSY     negative x 2    BREAST EXCISIONAL BIOPSY Left 1998   BREAST EXCISIONAL BIOPSY Right 1986   BUNIONECTOMY     w/hammer toe repair   CARPAL TUNNEL RELEASE Right    CERVICAL DISC SURGERY  12/2004   CT CTA CORONARY W/CA SCORE W/CM &/OR WO/CM  04/26/2019   Coronary calcium score 0.  No evidence of CAD.   CYSTO N/A 10/16/2015   Procedure: Adonica Ala;  Surgeon: Greta Leatherwood, MD;  Location: [facility];  Service: Gynecology;  Laterality: N/A;   CYSTOSCOPY N/A 02/26/2016   Procedure: CYSTOSCOPY;  Surgeon: Greta Leatherwood, MD;  Location: [facility];  Service: Gynecology;  Laterality: N/A;   KNEE ARTHROSCOPY     right knee x 2   MENISCUS REPAIR     right knee x 1   TENS     upper and lower back   TONSILECTOMY, ADENOIDECTOMY, BILATERAL MYRINGOTOMY AND TUBES     TOTAL KNEE ARTHROPLASTY Right 04/09/2022   Procedure: TOTAL KNEE ARTHROPLASTY;  Surgeon: Orvan Blanch, MD;  Location: [facility];  Service: Orthopedics;  Laterality: Right;   TOTAL VAGINAL HYSTERECTOMY  05/2004   adenomyosis, prolapse--ovaries remain   TRANSTHORACIC ECHOCARDIOGRAM  05/17/2019   EF 60 to 65%.  No R WMA.  Normal diastolic numbers.  No MR or MS.  No comment on MVP.  Mild aortic sclerosis with  no stenosis.  Normal RAP.   TRANSVAGINAL TAPE (TVT) REMOVAL N/A 02/26/2016   Procedure: Lysis of mid urethral sling, Anterior Colporrhaphy, Cystoscopy ;  Surgeon: Greta Leatherwood, MD;  Location: [facility];  Service: Gynecology;  Laterality: N/A;  first case/ move first case to follow this. Need 1 hour/    TUBAL LIGATION      Family History  Problem Relation Age of Onset   Heart failure Mother        congestive-presumably nonischemic cardiomyopathy   Diabetes Mellitus II Mother    Hypertension Mother    Coronary artery disease Father 44       MI   Diabetes Father    Heart attack Father 51       x2   Stroke Father 80   Hypertension Father    Hyperlipidemia Father    Diabetes Mellitus II Father    Cancer  Brother 69       lung cancer   Cancer Brother        metastasized, unknown origin   Diabetes Brother    Colon cancer Paternal Grandmother    Breast cancer Cousin    Breast cancer Cousin      Social Connections: Unknown (11/17/2021)   Received [facility], [facility]   Social Network    Social Network: Not on file      Current Outpatient Medications:    acetaminophen  (TYLENOL ) 500 MG tablet, Take 2 tablets (1,000 mg total) by mouth every 6 (six) hours as needed for mild pain (pain score 1-3)., Disp: 30 tablet, Rfl: 0   acetaZOLAMIDE  (DIAMOX ) 250 MG tablet, Take 1 tablet twice daily, Disp: 180 tablet, Rfl: 0   ALPRAZolam  (XANAX ) 0.5 MG tablet, Take 0.25 mg by mouth at bedtime., Disp: , Rfl:    atorvastatin (LIPITOR) 20 MG tablet, Take 20 mg by mouth daily., Disp: , Rfl:    azelastine  (ASTELIN ) 0.1 % nasal spray, Place 2 sprays into both nostrils 2 (two) times daily as needed for rhinitis or allergies. Use in each nostril as directed for congestion, Disp: 30 mL, Rfl: 12   brexpiprazole  (REXULTI ) 2 MG TABS tablet, Take 2 mg by mouth at bedtime., Disp: , Rfl:    Cholecalciferol  (VITAMIN D -3) 5000 UNITS TABS, Take 5,000 Units by mouth daily., Disp: , Rfl:    desvenlafaxine (PRISTIQ) 100 MG 24 hr tablet, Take 100 mg by mouth daily., Disp: , Rfl:    ibuprofen  (ADVIL ) 200 MG tablet, Take 2 tablets (400 mg total) by mouth every 6 (six) hours as needed., Disp: 30 tablet, Rfl: 0   losartan  (COZAAR ) 50 MG tablet, Take 25 mg by mouth daily with supper., Disp: , Rfl:    magnesium  oxide (MAG-OX) 400 MG tablet, Take 400 mg by mouth at bedtime., Disp: , Rfl:    metoprolol  succinate (TOPROL -XL) 100 MG 24 hr tablet, Take 100 mg by mouth daily., Disp: , Rfl:    omeprazole (PRILOSEC) 20 MG capsule, Take 20 mg by mouth every morning., Disp: , Rfl:    sodium chloride  (OCEAN) 0.65 % SOLN nasal spray, Place 2 sprays into both nostrils every 4 (four) hours as needed for congestion., Disp: 30 mL, Rfl:  2   triamcinolone  (NASACORT ) 55 MCG/ACT AERO nasal inhaler, Place 2 sprays into the nose daily as needed (Allergies and congestion)., Disp: , Rfl:    Physical Exam:   BP 125/80 (BP Location: Right Arm, Patient Position: Sitting, Cuff Size: Normal)   Pulse 70  Ht 5\' 2"  (1.575 m)   Wt 140 lb (63.5 kg)   LMP 03/07/2004 (Approximate)   SpO2 95%   BMI 25.61 kg/m   Salient findings:  CN II-XII intact Given history and complaints, ear microscopy was indicated and performed for evaluation with findings as below in physical exam section and in procedures; Left ear cerumen impaction -- after clearance, TM intact with well pneumatized middle ear space Anterior rhinoscopy: Septum relatively midline; bilateral turbinates reduced; No significant nasal valve collapse on mod cottle neg No lesions of oral cavity/oropharynx; tonsils 1/1, tongue friedman 2 No obviously palpable neck masses/lymphadenopathy/thyromegaly No respiratory distress or stridor  Seprately Identifiable Procedures:  PROCEDURE: Bilateral Diagnostic Rigid Nasal Endoscopy (Prior not today) Description of Procedure:  Patient was identified. A rigid 30 degree endoscope was utilized to evaluate the sinonasal cavities, mucosa, sinus ostia and turbinates and septum.  Overall, signs of mucosal inflammation are not noted - mild dryness Also noted are mod left septal deviation, bilateral ITH.  No mucopurulence, polyps, or masses noted.   Right Middle meatus: clear Right SE Recess: clear Left MM: clear Left SE Recess: clear   Photodocumentation was obtained.   Procedure: Bilateral ear microscopy and cerumen removal using microscope (CPT [id number]) - Mod 25 Pre-procedure diagnosis: Cerumen impaction left external ears Post-procedure diagnosis: same Indication: Left cerumen impaction; given patient's otologic complaints and history as well as for improved and comprehensive examination of external ear and tympanic membrane, bilateral otologic  examination using microscope was performed and impacted cerumen removed  Procedure: Patient was placed semi-recumbent. Both ear canals were examined using the microscope with findings above. Impacted Cerumen removed on left with improvement in EAC examination and patency. Patient tolerated the procedure well.       Impression & Plans:  Rebakah Wilmer is a 68 y.o. female with:  1. Asymmetrical sensorineural hearing loss   2. Hypertrophy of both inferior nasal turbinates   3. Nasal obstruction   4. Nasal septal deviation   5. Sensorineural hearing loss (SNHL) of both ears   6. Impacted cerumen of left ear    S/p septo/turbs 10/05/2023 and doing well with improved nasal breathing For her asymmetric SNHL, would recommend continued amplification and will get MRI to rule out left retrocochlear lesion; she is in agreement  - MRI IAC - f/u with audio in 3 months - Continue flonase  See below regarding exact medications prescribed this encounter including dosages and route: No orders of the defined types were placed in this encounter.     Thank you for allowing me the opportunity to care for your patient. Please do not hesitate to contact me should you have any other questions.  Sincerely, Milon Aloe, MD Otolaryngologist (ENT), [facility] Phone: [phone] Fax: [phone]  11/09/2023, 8:36 AM   I have personally spent 32 minutes involved in face-to-face and non-face-to-face activities for this patient on the day of the visit.  Professional time spent excludes any procedures performed but includes the following activities, in addition to those noted in the documentation: preparing to see the patient (review of outside documentation and results), performing a medically appropriate examination, counseling, documenting in the electronic health record, independently interpreting results (outside audiogram).

## 2023-11-09 NOTE — Addendum Note (Signed)
 Addended by: Nana Hoselton on: 11/09/2023 08:36 AM   Modules accepted: Orders, Level of Service

## 2023-11-11 NOTE — Addendum Note (Signed)
 Addended by: Vitoria Conyer on: 11/11/2023 08:15 AM   Modules accepted: Level of Service

## 2023-11-24 ENCOUNTER — Ambulatory Visit (HOSPITAL_COMMUNITY)
Admission: RE | Admit: 2023-11-24 | Discharge: 2023-11-24 | Disposition: A | Discharge status: Home / Self Care | Source: Ambulatory Visit | Attending: Otolaryngology | Admitting: Otolaryngology

## 2023-11-24 DIAGNOSIS — H903 Sensorineural hearing loss, bilateral: Secondary | ICD-10-CM | POA: Diagnosis not present

## 2023-11-24 DIAGNOSIS — H748X3 Other specified disorders of middle ear and mastoid, bilateral: Secondary | ICD-10-CM | POA: Diagnosis not present

## 2023-11-24 MED ORDER — GADOPICLENOL 0.5 MMOL/ML IV SOLN
6.0000 mL | Freq: Once | INTRAVENOUS | Status: AC | PRN
  Administered 2023-11-24: 6 mL via INTRAVENOUS

## 2023-11-25 DIAGNOSIS — N3000 Acute cystitis without hematuria: Secondary | ICD-10-CM | POA: Diagnosis not present

## 2023-12-31 DIAGNOSIS — R350 Frequency of micturition: Secondary | ICD-10-CM | POA: Diagnosis not present

## 2023-12-31 DIAGNOSIS — R300 Dysuria: Secondary | ICD-10-CM | POA: Diagnosis not present

## 2024-01-04 ENCOUNTER — Encounter: Payer: Self-pay | Admitting: Obstetrics and Gynecology

## 2024-01-04 ENCOUNTER — Other Ambulatory Visit (HOSPITAL_COMMUNITY)
Admission: RE | Admit: 2024-01-04 | Discharge: 2024-01-04 | Disposition: A | Discharge status: Home / Self Care | Source: Ambulatory Visit | Attending: Obstetrics and Gynecology | Admitting: Obstetrics and Gynecology

## 2024-01-04 ENCOUNTER — Ambulatory Visit (INDEPENDENT_AMBULATORY_CARE_PROVIDER_SITE_OTHER): Admitting: Obstetrics and Gynecology

## 2024-01-04 VITALS — BP 114/74 | HR 66 | Ht 64.25 in | Wt 155.0 lb

## 2024-01-04 DIAGNOSIS — Z8742 Personal history of other diseases of the female genital tract: Secondary | ICD-10-CM | POA: Diagnosis not present

## 2024-01-04 DIAGNOSIS — R102 Pelvic and perineal pain: Secondary | ICD-10-CM

## 2024-01-04 DIAGNOSIS — Z9289 Personal history of other medical treatment: Secondary | ICD-10-CM

## 2024-01-04 DIAGNOSIS — Z87440 Personal history of urinary (tract) infections: Secondary | ICD-10-CM

## 2024-01-04 DIAGNOSIS — Z9189 Other specified personal risk factors, not elsewhere classified: Secondary | ICD-10-CM

## 2024-01-04 DIAGNOSIS — Z01419 Encounter for gynecological examination (general) (routine) without abnormal findings: Secondary | ICD-10-CM

## 2024-01-04 MED ORDER — ESTRADIOL 0.1 MG/GM VA CREA: TOPICAL_CREAM | VAGINAL | 2 refills | Status: AC

## 2024-01-04 NOTE — Progress Notes (Signed)
 68 y.o. G56P2012 Married Caucasian female here for a breast and pelvic exam.    7 UTIs since February.  Seeing her PCP.  Has a burning sensation with urination and even when she does not need to void.  Has pain, pressure, and frequency.  She has noted blood in the toilet.  Her last UC was negative for infection 12/31/23.  E Coli infection in April and June.  See [facility]. She will see urology.  Her PCP will place this referral.   Has urinary incontinence with her UTIs.  Cannot get to the bathroom on time.  Otherwise has minimal urinary incontinence.   She will see her PCP in July for her routine exam.   PCP: Teresa Channel, MD   Patient's last menstrual period was 03/07/2004 (approximate).           Sexually active: No.  The current method of family planning is status post hysterectomy.    Menopausal hormone therapy:  n/a Exercising: Yes.    Walking a mile 3x week  Smoker:  yes  OB History     Gravida  3   Para  2   Term  2   Preterm  0   AB  1   Living  2      SAB  1   IAB  0   Ectopic  0   Multiple  0   Live Births  2           HEALTH MAINTENANCE: Last 2 paps: 04/21/06 neg History of abnormal Pap or positive HPV:  no Mammogram:  04/01/23 Breast Density Cat B, BIRADS Cat 1 neg  Colonoscopy:  2017 - due in 2026.  Bone Density:  03/26/22  Result  osteopenia foream and hip. FRAX 9.3%, 1.7%.    Immunization History  Administered Date(s) Administered   Influenza Inj Mdck Quad Pf 03/18/2018   Influenza,inj,Quad PF,6+ Mos 04/30/2018   Influenza-Unspecified 04/29/2017   Moderna Sars-Covid-2 Vaccination 08/18/2019, 09/15/2019, 05/01/2020   Pneumococcal Polysaccharide-23 09/06/2014   Tdap 11/04/2008, 09/13/2018      reports that she has been smoking cigarettes. She has a 12 pack-year smoking history. She has never used smokeless tobacco. She reports that she does not drink alcohol and does not use drugs.  Past Medical History:  Diagnosis Date    Anxiety    Aortic atherosclerosis (HCC)    Seen on imaging studies   Cervical disc disease    Complication of anesthesia    prolonged sedation   COPD (chronic obstructive pulmonary disease) with emphysema (HCC)    Family history of adverse reaction to anesthesia    Mother N&V   GERD (gastroesophageal reflux disease)    HTN (hypertension)    Hyperlipidemia    Lumbar herniated disc 07/07/2021   MDD (major depressive disorder), recurrent, in partial remission (HCC)    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  Minimum saturation 79%-Dr. Chaya   Osteoarthritis    generalized   PONV (postoperative nausea and vomiting)    Pre-diabetes    Improved with weigth loss   Prediabetes    Tobacco dependence    Vision abnormalities     Past Surgical History:  Procedure Laterality Date   ANTERIOR AND POSTERIOR REPAIR N/A 10/16/2015   Procedure: ANTERIOR (CYSTOCELE) AND POSTERIOR REPAIR (RECTOCELE);  Surgeon: Bobie FORBES Cathlyn JAYSON Nikki, MD;  Location: [facility];  Service: Gynecology;  Laterality: N/A;   APPENDECTOMY  1974   BLADDER SUSPENSION N/A 10/16/2015   Procedure: TRANSVAGINAL TAPE (TVT) PROCEDURE exact midurethral sling;  Surgeon: Bobie FORBES Cathlyn JAYSON Nikki, MD;  Location: [facility];  Service: Gynecology;  Laterality: N/A;   BREAST BIOPSY     negative x 2    BREAST EXCISIONAL BIOPSY Left 1998   BREAST EXCISIONAL BIOPSY Right 1986   BUNIONECTOMY     w/hammer toe repair   CARPAL TUNNEL RELEASE Right    CERVICAL DISC SURGERY  12/2004   CT CTA CORONARY W/CA SCORE W/CM &/OR WO/CM  04/26/2019   Coronary calcium score 0.  No evidence of CAD.   CYSTO N/A 10/16/2015   Procedure: JONATHON;  Surgeon: Bobie FORBES Cathlyn JAYSON Nikki, MD;  Location: [facility];  Service: Gynecology;  Laterality: N/A;   CYSTOSCOPY N/A 02/26/2016    Procedure: CYSTOSCOPY;  Surgeon: Bobie FORBES Cathlyn JAYSON Nikki, MD;  Location: [facility];  Service: Gynecology;  Laterality: N/A;   KNEE ARTHROSCOPY     right knee x 2   MENISCUS REPAIR     right knee x 1   TENS     upper and lower back   TONSILECTOMY, ADENOIDECTOMY, BILATERAL MYRINGOTOMY AND TUBES     TOTAL KNEE ARTHROPLASTY Right 04/09/2022   Procedure: TOTAL KNEE ARTHROPLASTY;  Surgeon: Duwayne Purchase, MD;  Location: [facility];  Service: Orthopedics;  Laterality: Right;   TOTAL VAGINAL HYSTERECTOMY  05/2004   adenomyosis, prolapse--ovaries remain   TRANSTHORACIC ECHOCARDIOGRAM  05/17/2019   EF 60 to 65%.  No R WMA.  Normal diastolic numbers.  No MR or MS.  No comment on MVP.  Mild aortic sclerosis with no stenosis.  Normal RAP.   TRANSVAGINAL TAPE (TVT) REMOVAL N/A 02/26/2016   Procedure: Lysis of mid urethral sling, Anterior Colporrhaphy, Cystoscopy ;  Surgeon: Bobie FORBES Cathlyn JAYSON Nikki, MD;  Location: [facility];  Service: Gynecology;  Laterality: N/A;  first case/ move first case to follow this. Need 1 hour/    TUBAL LIGATION      Current Outpatient Medications  Medication Sig Dispense Refill   acetaminophen  (TYLENOL ) 500 MG tablet Take 2 tablets (1,000 mg total) by mouth every 6 (six) hours as needed for mild pain (pain score 1-3). 30 tablet 0   acetaZOLAMIDE  (DIAMOX ) 250 MG tablet Take 1 tablet twice daily 180 tablet 0   ALPRAZolam  (XANAX ) 0.5 MG tablet Take 0.25 mg by mouth at bedtime.     atorvastatin (LIPITOR) 20 MG tablet Take 20 mg by mouth daily.     azelastine  (ASTELIN ) 0.1 % nasal spray Place 2 sprays into both nostrils 2 (two) times daily as needed for rhinitis or allergies. Use in each nostril as directed for congestion 30 mL 12   brexpiprazole  (REXULTI ) 2 MG TABS tablet Take 2 mg by mouth at bedtime.     Cholecalciferol  (VITAMIN D -3) 5000 UNITS TABS Take 5,000 Units by mouth daily.     desvenlafaxine (PRISTIQ) 100 MG 24 hr tablet Take 100 mg by mouth daily.     ibuprofen  (ADVIL ) 200 MG  tablet Take 2 tablets (400 mg total) by mouth every 6 (six) hours as needed. 30 tablet 0   losartan  (COZAAR ) 50 MG tablet Take 25 mg by mouth daily with supper.     magnesium  oxide (MAG-OX) 400 MG tablet Take 400 mg by mouth at bedtime.     metoprolol   succinate (TOPROL -XL) 100 MG 24 hr tablet Take 100 mg by mouth daily.     omega-3 acid ethyl esters (LOVAZA) 1 g capsule Take 2 capsules by mouth 2 (two) times daily.     omeprazole (PRILOSEC) 20 MG capsule Take 20 mg by mouth every morning.     sodium chloride  (OCEAN) 0.65 % SOLN nasal spray Place 2 sprays into both nostrils every 4 (four) hours as needed for congestion. 30 mL 2   triamcinolone  (NASACORT ) 55 MCG/ACT AERO nasal inhaler Place 2 sprays into the nose daily as needed (Allergies and congestion).     No current facility-administered medications for this visit.    ALLERGIES: Aripiprazole, Fluoxetine, Hydrocodone, and Percocet [oxycodone-acetaminophen ]  Family History  Problem Relation Age of Onset   Heart failure Mother        congestive-presumably nonischemic cardiomyopathy   Diabetes Mellitus II Mother    Hypertension Mother    Coronary artery disease Father 36       MI   Diabetes Father    Heart attack Father 51       x2   Stroke Father 42   Hypertension Father    Hyperlipidemia Father    Diabetes Mellitus II Father    Cancer Brother 109       lung cancer   Cancer Brother        metastasized, unknown origin   Diabetes Brother    Colon cancer Paternal Grandmother    Breast cancer Cousin    Breast cancer Cousin     Review of Systems  All other systems reviewed and are negative.   PHYSICAL EXAM:  BP 114/74 (BP Location: Left Arm, Patient Position: Sitting)   Pulse 66   Ht 5' 4.25 (1.632 m)   Wt 155 lb (70.3 kg)   LMP 03/07/2004 (Approximate)   SpO2 99%   BMI 26.40 kg/m     General appearance: alert, cooperative and appears stated age Head: normocephalic, without obvious abnormality, atraumatic Neck: no  adenopathy, supple, symmetrical, trachea midline and thyroid  normal to inspection and palpation Lungs: clear to auscultation bilaterally Breasts: normal appearance, no masses or tenderness, No nipple retraction or dimpling, No nipple discharge or bleeding, No axillary adenopathy Heart: regular rate and rhythm Abdomen: soft, non-tender; no masses, no organomegaly Extremities: extremities normal, atraumatic, no cyanosis or edema Skin: skin color, texture, turgor normal. No rashes or lesions Lymph nodes: cervical, supraclavicular, and axillary nodes normal. Neurologic: grossly normal  Pelvic: External genitalia:  no lesions              No abnormal inguinal nodes palpated.              Urethra:  normal appearing urethra with no masses, tenderness or lesions              Bartholins and Skenes: normal                 Vagina: normal appearing vagina with normal color and discharge, no lesions              Cervix: absent              Pap taken: no. Bimanual Exam:  Uterus:  absent              Adnexa: no mass, fullness, tenderness              Rectal exam: yes.  Confirms.  Anus:  normal sphincter tone, no lesions  Chaperone was present for exam:  Kari HERO, CMA  ASSESSMENT: Encounter for breast and pelvic exam.  Personal history of other medical treatment.  Personal risk factors not specified elsewhere.  Status post TVH - fibroids, adenomyosis.  Ovaries remain.  Status post A and P repair with TVT and cysto.  Status post lysis of midurethral sling.    Hx UTI.  Hematuria.   Pelvic discomfort.  Hx abx use.  MGUS.  FH multiple cancers.  Genetic testing negative.  Periodic paralysis.    PLAN: Mammogram screening discussed. Self breast awareness reviewed. Pap and HRV collected:  no.  Not indicated. Guidelines for Calcium, Vitamin D , regular exercise program including cardiovascular and weight bearing exercise. Medications:  Estrace  cream.  1/2 gm pv and to introitus at hs x 2  weeks and then 1/2 mg pv at hs 2 - 3 times per week.  Nuswab sent for vaginitis screening.  She will pursue a urology consultation and bone density through her PCP.  Follow up:  yearly and prn.     Additional counseling given.  yes. 30 min  total time was spent for this patient encounter, including preparation, face-to-face counseling with the patient, coordination of care, and documentation of the encounter in addition to doing the breast and pelvic exam.

## 2024-01-04 NOTE — Patient Instructions (Signed)

## 2024-01-05 LAB — CERVICOVAGINAL ANCILLARY ONLY
Bacterial Vaginitis (gardnerella): POSITIVE — AB
Candida Glabrata: NEGATIVE
Candida Vaginitis: NEGATIVE
Comment: NEGATIVE
Comment: NEGATIVE
Comment: NEGATIVE
Comment: NEGATIVE
Trichomonas: NEGATIVE

## 2024-01-06 ENCOUNTER — Ambulatory Visit: Payer: Self-pay | Admitting: Obstetrics and Gynecology

## 2024-01-06 ENCOUNTER — Other Ambulatory Visit: Payer: Self-pay | Admitting: Obstetrics and Gynecology

## 2024-01-06 MED ORDER — METRONIDAZOLE 500 MG PO TABS
500.0000 mg | ORAL_TABLET | Freq: Two times a day (BID) | ORAL | 0 refills | Status: DC
Start: 2024-01-06 — End: 2024-01-20

## 2024-01-06 NOTE — Progress Notes (Signed)
 Rx for Flagyl ?

## 2024-01-18 NOTE — Patient Instructions (Signed)

## 2024-01-18 NOTE — Progress Notes (Unsigned)
 No chief complaint on file.   HISTORY OF PRESENT ILLNESS:  01/18/24 ALL:  [name] is a 68 y.o. 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 She was advised to continue Diamox  250mg  BID at last visit with Dr Vear 07/2020. She reports doing really well and has had no episodes. She continues to exercise at the [facility] 3x/week with no reports of weakness. Her recent labs were unremarkable.   CPAP managed with Dr Tammy at [place]. Reports she is doing well with this.  Depression and anxiety are doing well (sees [facility])   HISTORY (copied from Dr Duncan previous note)  [name] is a 68 y.o. woman wih episodes of weakness.   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   She has no family history of episodic weakness.    Symptoms started in her 40's.   Her kids in their 72's are doing well.   She started Diamox  in 2018 and she has not had a single major spell since starting (prior to Diamox , she was having 3-5 spells of weakness a week lasting hours at a time).     She is on Diamox  250 mg po bid   She reports doing well an has no major spell since starting and no recent minor spell.    Carbonated beverages taste a little funny.    She has IgG kappa MGUS (diagnosed in 2013) and sees Dr. Lonn.   Her M-spike was 0.3 in 2018. She had a BM biopsy and bone survey at diagnosis.    She denies numbness as would be expected if she had polyneuropathy.  Balance is fine and she does not need to hold on to the wall when eyes closed (I.e shampooing hair).      Bone survey in 2017 showed stable lytic inferior pubic rami and stable subtle skull lucencies.    She sees hematology and is continuing with watchful waiting.    She does bloodwork annually   Depression and anxiety are doing well (sees [facility])   In 2017, she was diagnosed with severe obstructive sleep apnea. She was placed on CPAP by Dr. Tammy [facility]) . Sleepiness improved with every night use.      REVIEW OF SYSTEMS: Out of a complete 14 system review of symptoms, the patient complains only of the following symptoms, numbness of feet and all other reviewed systems are negative.   ALLERGIES: Allergies  Allergen Reactions   Aripiprazole Other (See Comments)    Felt hyper   Fluoxetine Other (See Comments)    Ineffective   Hydrocodone Other (See Comments)    Headache   Percocet [Oxycodone-Acetaminophen ] Other (See Comments)    Causes headaches     HOME MEDICATIONS: Outpatient Medications Prior to Visit  Medication Sig Dispense Refill   metroNIDAZOLE  (FLAGYL ) 500 MG tablet Take 1 tablet (500 mg total) by mouth 2 (two) times daily. Take twice daily for one week. 14 tablet 0   acetaminophen  (TYLENOL ) 500 MG tablet Take 2 tablets (1,000 mg total) by mouth every 6 (six) hours as needed for mild pain (pain score 1-3). 30 tablet 0   acetaZOLAMIDE  (DIAMOX ) 250 MG tablet Take 1 tablet twice daily 180 tablet 0   ALPRAZolam  (XANAX ) 0.5 MG tablet Take 0.25 mg by mouth at bedtime.     atorvastatin (LIPITOR) 20  MG tablet Take 20 mg by mouth daily.     azelastine  (ASTELIN ) 0.1 % nasal spray Place 2 sprays into both nostrils 2 (two) times daily as needed for rhinitis or allergies. Use in each nostril as directed for congestion 30 mL 12   brexpiprazole  (REXULTI ) 2 MG TABS tablet Take 2 mg by mouth at bedtime.     Cholecalciferol  (VITAMIN D -3) 5000 UNITS TABS Take 5,000 Units by mouth daily.     desvenlafaxine (PRISTIQ) 100 MG 24 hr tablet Take 100 mg by mouth daily.     estradiol  (ESTRACE ) 0.1 MG/GM vaginal cream Use 1/2 g vaginally every night for the first 2 weeks, then use 1/2 g vaginally two or three times per week as needed to maintain symptom relief.  With each application for the vagina, also place a pea size  amount to the vaginal opening and urethra. 42.5 g 2   ibuprofen  (ADVIL ) 200 MG tablet Take 2 tablets (400 mg total) by mouth every 6 (six) hours as needed. 30 tablet 0   losartan  (COZAAR ) 50 MG tablet Take 25 mg by mouth daily with supper.     magnesium  oxide (MAG-OX) 400 MG tablet Take 400 mg by mouth at bedtime.     metoprolol  succinate (TOPROL -XL) 100 MG 24 hr tablet Take 100 mg by mouth daily.     omega-3 acid ethyl esters (LOVAZA) 1 g capsule Take 2 capsules by mouth 2 (two) times daily.     omeprazole (PRILOSEC) 20 MG capsule Take 20 mg by mouth every morning.     sodium chloride  (OCEAN) 0.65 % SOLN nasal spray Place 2 sprays into both nostrils every 4 (four) hours as needed for congestion. 30 mL 2   triamcinolone  (NASACORT ) 55 MCG/ACT AERO nasal inhaler Place 2 sprays into the nose daily as needed (Allergies and congestion).     No facility-administered medications prior to visit.     PAST MEDICAL HISTORY: Past Medical History:  Diagnosis Date   Anxiety    Aortic atherosclerosis (HCC)    Seen on imaging studies   Cervical disc disease    Complication of anesthesia    prolonged sedation   COPD (chronic obstructive pulmonary disease) with emphysema (HCC)    Family history of adverse reaction to anesthesia    Mother N&V   GERD (gastroesophageal reflux disease)    HTN (hypertension)    Hyperlipidemia    Lumbar herniated disc 07/07/2021   MDD (major depressive disorder), recurrent, in partial remission (HCC)    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  Minimum saturation 79%-Dr. Chaya   Osteoarthritis    generalized   PONV (postoperative nausea and vomiting)    Pre-diabetes    Improved with weigth loss   Prediabetes    Tobacco dependence    Vision abnormalities      PAST SURGICAL  HISTORY: Past Surgical History:  Procedure Laterality Date   ANTERIOR AND POSTERIOR REPAIR N/A 10/16/2015   Procedure: ANTERIOR (CYSTOCELE) AND POSTERIOR REPAIR (RECTOCELE);  Surgeon: Bobie FORBES Cathlyn JAYSON Nikki, MD;  Location: [facility];  Service: Gynecology;  Laterality: N/A;   APPENDECTOMY  1974   BLADDER SUSPENSION N/A 10/16/2015   Procedure: TRANSVAGINAL TAPE (TVT) PROCEDURE exact midurethral sling;  Surgeon: Bobie FORBES Cathlyn JAYSON Nikki, MD;  Location: [facility];  Service: Gynecology;  Laterality: N/A;   BREAST BIOPSY     negative x 2    BREAST EXCISIONAL BIOPSY Left 1998   BREAST EXCISIONAL BIOPSY Right 1986   BUNIONECTOMY     w/hammer toe repair   CARPAL TUNNEL RELEASE Right    CERVICAL DISC SURGERY  12/2004   CT CTA CORONARY W/CA SCORE W/CM &/OR WO/CM  04/26/2019   Coronary calcium score 0.  No evidence of CAD.   CYSTO N/A 10/16/2015   Procedure: JONATHON;  Surgeon: Bobie FORBES Cathlyn JAYSON Nikki, MD;  Location: [facility];  Service: Gynecology;  Laterality: N/A;   CYSTOSCOPY N/A 02/26/2016   Procedure: CYSTOSCOPY;  Surgeon: Bobie FORBES Cathlyn JAYSON Nikki, MD;  Location: [facility];  Service: Gynecology;  Laterality: N/A;   KNEE ARTHROSCOPY     right knee x 2   MENISCUS REPAIR     right knee x 1   TENS     upper and lower back   TONSILECTOMY, ADENOIDECTOMY, BILATERAL MYRINGOTOMY AND TUBES     TOTAL KNEE ARTHROPLASTY Right 04/09/2022   Procedure: TOTAL KNEE ARTHROPLASTY;  Surgeon: Duwayne Purchase, MD;  Location: [facility];  Service: Orthopedics;  Laterality: Right;   TOTAL VAGINAL HYSTERECTOMY  05/2004   adenomyosis, prolapse--ovaries remain   TRANSTHORACIC ECHOCARDIOGRAM  05/17/2019   EF 60 to 65%.  No R WMA.  Normal diastolic numbers.  No MR or MS.  No comment on MVP.  Mild aortic sclerosis with no stenosis.  Normal RAP.   TRANSVAGINAL TAPE (TVT) REMOVAL N/A 02/26/2016   Procedure: Lysis of mid urethral sling, Anterior Colporrhaphy, Cystoscopy ;  Surgeon: Bobie FORBES Cathlyn JAYSON Nikki, MD;  Location: [facility];   Service: Gynecology;  Laterality: N/A;  first case/ move first case to follow this. Need 1 hour/    TUBAL LIGATION       FAMILY HISTORY: Family History  Problem Relation Age of Onset   Heart failure Mother        congestive-presumably nonischemic cardiomyopathy   Diabetes Mellitus II Mother    Hypertension Mother    Coronary artery disease Father 23       MI   Diabetes Father    Heart attack Father 42       x2   Stroke Father 18   Hypertension Father    Hyperlipidemia Father    Diabetes Mellitus II Father    Cancer Brother 22       lung cancer   Cancer Brother        metastasized, unknown origin   Diabetes Brother    Colon cancer Paternal Grandmother    Breast cancer Cousin    Breast cancer Cousin      SOCIAL HISTORY: Social History   Socioeconomic History   Marital status: Married    Spouse name: Not on file   Number of children: 2   Years of education: Not on file   Highest education level: Not on file  Occupational History    Comment: Intake coordinator   Occupation: Intake coordinator/administration    Comment: Disabled  Tobacco Use   Smoking status: Every Day    Current packs/day: 0.25    Average packs/day: 0.3 packs/day for 48.0 years (12.0 ttl pk-yrs)    Types: Cigarettes   Smokeless tobacco: Never   Tobacco comments:    feb 202 at 1/2 to 3/4 pack cigs per day  Vaping Use   Vaping status: Every Day   Start date: 02/05/2020  Substance and Sexual Activity  Alcohol use: No    Alcohol/week: 0.0 standard drinks of alcohol   Drug use: No   Sexual activity: Not Currently    Partners: Female    Birth control/protection: Surgical    Comment: Hyst, More than 5, before 15,hx of PID at 17, no DES  Other Topics Concern   Not on file  Social History Narrative   Right handed    Caffeine use: 3 cups per day   Lives with partner: Medford Ashworth.   --> 2 kids [place] Gart[place] and [place]; 2 grandchildren   Prescription, but does not go to church.       Has cut down to 7 cigarettes a day from 10-12.  He is using vaping to assist.      Exercise: Tries to go 3 days a week to the [facility].  She does about mile and a half on the treadmill and up 30-minute on machines.  She used to do it more frequently and longer on the treadmill at least 3 miles, but this is been reduced her knee pain.   Social Drivers of Corporate investment banker Strain: Not on file  Food Insecurity: No Food Insecurity (04/09/2022)   Hunger Vital Sign    Worried About Running Out of Food in the Last Year: Never true    Ran Out of Food in the Last Year: Never true  Transportation Needs: No Transportation Needs (04/09/2022)   PRAPARE - Administrator, Civil Service (Medical): No    Lack of Transportation (Non-Medical): No  Physical Activity: Not on file  Stress: Not on file  Social Connections: Unknown (11/17/2021)   [facility]   Social Network    Social Network: Not on file  Intimate Partner Violence: Not At Risk (04/09/2022)   Humiliation, Afraid, Rape, and Kick questionnaire    Fear of Current or Ex-Partner: No    Emotionally Abused: No    Physically Abused: No    Sexually Abused: No     PHYSICAL EXAM  There were no vitals filed for this visit.  There is no height or weight on file to calculate BMI.  Generalized: Well developed, in no acute distress  Cardiology: normal rate and rhythm, no murmur auscultated  Respiratory: clear to auscultation bilaterally    Neurological examination  Mentation: Alert oriented to time, place, history taking. Follows all commands speech and language fluent Cranial nerve II-XII: Pupils were equal round reactive to light. Extraocular movements were full, visual field were full on confrontational test. Facial sensation and strength were normal. Head turning and shoulder shrug  were normal and symmetric. Motor: The motor testing reveals 5 over 5 strength of all 4 extremities. Good symmetric motor tone is  noted throughout.  Sensory: Sensory testing is intact to soft touch on all 4 extremities. No evidence of extinction is noted.  Coordination: Cerebellar testing reveals good finger-nose-finger and heel-to-shin bilaterally.  Gait and station: Gait is normal.   Reflexes: Deep tendon reflexes are symmetric and normal bilaterally.    DIAGNOSTIC DATA (LABS, IMAGING, TESTING) - I reviewed patient records, labs, notes, testing and imaging myself where available.  Lab Results  Component Value Date   WBC 9.1 08/21/2023   HGB 14.8 08/21/2023   HCT 45.5 08/21/2023   MCV 93.2 08/21/2023   PLT 335 08/21/2023      Component Value Date/Time   NA 137 08/21/2023 1257   NA 140 04/24/2021 1612   NA 140 03/06/2017 1027  K 4.3 08/21/2023 1257   K 4.5 03/06/2017 1027   CL 105 08/21/2023 1257   CL 103 06/11/2012 1059   CO2 26 08/21/2023 1257   CO2 25 03/06/2017 1027   GLUCOSE 66 (L) 08/21/2023 1257   GLUCOSE 111 03/06/2017 1027   GLUCOSE 123 (H) 06/11/2012 1059   BUN 12 08/21/2023 1257   BUN 20 04/24/2021 1612   BUN 17.2 03/06/2017 1027   CREATININE 0.78 08/21/2023 1257   CREATININE 1.0 03/06/2017 1027   CALCIUM 9.8 08/21/2023 1257   CALCIUM 10.0 03/06/2017 1027   PROT 7.5 08/21/2023 1257   PROT 7.5 03/06/2017 1027   PROT 7.0 03/06/2017 1027   ALBUMIN 4.6 08/21/2023 1257   ALBUMIN 4.0 03/06/2017 1027   AST 13 (L) 08/21/2023 1257   AST 14 03/06/2017 1027   ALT 11 08/21/2023 1257   ALT 12 03/06/2017 1027   ALKPHOS 93 08/21/2023 1257   ALKPHOS 60 03/06/2017 1027   BILITOT 0.4 08/21/2023 1257   BILITOT 0.30 03/06/2017 1027   GFRNONAA >60 08/21/2023 1257   GFRAA 72 11/21/2016 0951   No results found for: CHOL, HDL, LDLCALC, LDLDIRECT, TRIG, CHOLHDL Lab Results  Component Value Date   HGBA1C 5.8 (H) 03/27/2022   No results found for: VITAMINB12 No results found for: TSH      No data to display               No data to display           ASSESSMENT AND  PLAN  68 y.o. year old female  has a past medical history of Anxiety, Aortic atherosclerosis (HCC), Cervical disc disease, Complication of anesthesia, COPD (chronic obstructive pulmonary disease) with emphysema (HCC), Family history of adverse reaction to anesthesia, GERD (gastroesophageal reflux disease), HTN (hypertension), Hyperlipidemia, Lumbar herniated disc (07/07/2021), MDD (major depressive disorder), recurrent, in partial remission (HCC), MGUS (monoclonal gammopathy of unknown significance) (03/15/2015), Migraine headache, Monoclonal (M) protein disease, multiple 'M' protein, Obstructive sleep apnea (01/03/2016), Osteoarthritis, PONV (postoperative nausea and vomiting), Pre-diabetes, Prediabetes, Tobacco dependence, and Vision abnormalities. here with    No diagnosis found.  Dachelle is doing very well. We will continue Diamox  250 Mg BID. Labs reviewed in Epic and unremarkable. She will continue low carb diet and regular exercise. Continue close follow up with care team. Follow up in 1 year with Dr. Vear.   No orders of the defined types were placed in this encounter.    No orders of the defined types were placed in this encounter.  Greig Forbes, MSN, FNP-C 01/18/2024, 9:54 AM  [facility] [address] [place], [place] [address] [phone]

## 2024-01-20 ENCOUNTER — Encounter: Payer: Self-pay | Admitting: Family Medicine

## 2024-01-20 ENCOUNTER — Ambulatory Visit (INDEPENDENT_AMBULATORY_CARE_PROVIDER_SITE_OTHER): Payer: Medicare Other | Admitting: Family Medicine

## 2024-01-20 ENCOUNTER — Telehealth: Payer: Self-pay

## 2024-01-20 VITALS — BP 124/71 | HR 48 | Ht 62.0 in | Wt 152.0 lb

## 2024-01-20 DIAGNOSIS — G723 Periodic paralysis: Secondary | ICD-10-CM | POA: Diagnosis not present

## 2024-01-20 DIAGNOSIS — G4733 Obstructive sleep apnea (adult) (pediatric): Secondary | ICD-10-CM

## 2024-01-20 MED ORDER — ACETAZOLAMIDE 250 MG PO TABS: ORAL_TABLET | ORAL | 3 refills | Status: AC

## 2024-01-20 NOTE — Telephone Encounter (Signed)
 LVM letting pt know that NP Amy Lomax is running behind due to an Appointment and should be in either exactly at 11, 11:15am ,or 11:20am.

## 2024-01-20 NOTE — Telephone Encounter (Signed)
 Spoke with pt let her know [name] will be a behind. Pt stated she is fine waiting.

## 2024-01-22 DIAGNOSIS — I700 Atherosclerosis of aorta: Secondary | ICD-10-CM | POA: Diagnosis not present

## 2024-01-22 DIAGNOSIS — E559 Vitamin D deficiency, unspecified: Secondary | ICD-10-CM | POA: Diagnosis not present

## 2024-01-22 DIAGNOSIS — Z79899 Other long term (current) drug therapy: Secondary | ICD-10-CM | POA: Diagnosis not present

## 2024-01-22 DIAGNOSIS — M3500 Sicca syndrome, unspecified: Secondary | ICD-10-CM | POA: Diagnosis not present

## 2024-01-22 DIAGNOSIS — J449 Chronic obstructive pulmonary disease, unspecified: Secondary | ICD-10-CM | POA: Diagnosis not present

## 2024-01-22 DIAGNOSIS — Z0000 Encounter for general adult medical examination without abnormal findings: Secondary | ICD-10-CM | POA: Diagnosis not present

## 2024-01-22 DIAGNOSIS — I10 Essential (primary) hypertension: Secondary | ICD-10-CM | POA: Diagnosis not present

## 2024-01-22 DIAGNOSIS — D472 Monoclonal gammopathy: Secondary | ICD-10-CM | POA: Diagnosis not present

## 2024-01-22 DIAGNOSIS — F17210 Nicotine dependence, cigarettes, uncomplicated: Secondary | ICD-10-CM | POA: Diagnosis not present

## 2024-01-22 DIAGNOSIS — R7303 Prediabetes: Secondary | ICD-10-CM | POA: Diagnosis not present

## 2024-01-22 DIAGNOSIS — E782 Mixed hyperlipidemia: Secondary | ICD-10-CM | POA: Diagnosis not present

## 2024-01-23 ENCOUNTER — Other Ambulatory Visit (HOSPITAL_BASED_OUTPATIENT_CLINIC_OR_DEPARTMENT_OTHER): Payer: Self-pay | Admitting: Family Medicine

## 2024-01-23 DIAGNOSIS — M8580 Other specified disorders of bone density and structure, unspecified site: Secondary | ICD-10-CM

## 2024-02-08 ENCOUNTER — Telehealth (INDEPENDENT_AMBULATORY_CARE_PROVIDER_SITE_OTHER): Payer: Self-pay | Admitting: Otolaryngology

## 2024-02-08 NOTE — Telephone Encounter (Signed)
 LVM to reschedule appt - Provider not in office  - Patient given option to keep Audio appointment or reschedule both appointments.

## 2024-02-09 ENCOUNTER — Ambulatory Visit (INDEPENDENT_AMBULATORY_CARE_PROVIDER_SITE_OTHER): Admitting: Otolaryngology

## 2024-02-09 ENCOUNTER — Ambulatory Visit (INDEPENDENT_AMBULATORY_CARE_PROVIDER_SITE_OTHER): Admitting: Audiology

## 2024-02-09 DIAGNOSIS — H903 Sensorineural hearing loss, bilateral: Secondary | ICD-10-CM | POA: Diagnosis not present

## 2024-02-09 NOTE — Progress Notes (Signed)
  [address], [address] [place], [place] [address] [phone]  Audiological Evaluation    Name: [name]     DOB:   06/02/1956      MRN:   [id number]                                                                                     Service Date: 02/09/2024     Accompanied by: unaccompanied   Patient comes today after Dr. Tobie, ENT sent a referral for a hearing evaluation due to concerns with left hearing loss asymmetry.   Symptoms Yes Details  Hearing loss  [x]  Follow up audiogram after left hearing worse than right for about 5 years ago, per patient. Patient had test done  in [facility] - could not find in her records a copy of the audiogram.  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 Left ear:  Clear external ear canal and notable landmarks visualized on the tympanic membrane.  Tympanometry: Right ear: Type A- Normal external ear canal volume with normal middle ear pressure and tympanic membrane compliance. Left ear: Type A- Normal external ear canal volume with normal middle ear pressure and tympanic membrane compliance.   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  Speech Audiometry: Right ear- Speech Reception Threshold (SRT) was obtained at 30 dBHL. Left ear-Speech Reception Threshold (SRT) was obtained at 35 dBHL.   Word Recognition Score Tested using NU-6 (recorded) Right ear: 96% was obtained at a  presentation level of 65 dBHL with contralateral masking which is deemed as  excellent. Left ear: 80% was obtained at a presentation level of 70 dBHL with contralateral masking which is deemed as  good .    The hearing test results were completed under headphones and results are deemed to be of good to fair reliability. Test technique:  conventional      Recommendations: Follow up with ENT as scheduled for today. Return for a hearing evaluation if concerns with hearing changes arise or per MD recommendation. Consider a communication needs assessment after medical clearance for hearing aids is obtained.   Raj Landress [name], AUD

## 2024-03-02 DIAGNOSIS — D485 Neoplasm of uncertain behavior of skin: Secondary | ICD-10-CM | POA: Diagnosis not present

## 2024-03-02 DIAGNOSIS — L570 Actinic keratosis: Secondary | ICD-10-CM | POA: Diagnosis not present

## 2024-03-04 ENCOUNTER — Ambulatory Visit (INDEPENDENT_AMBULATORY_CARE_PROVIDER_SITE_OTHER): Admitting: Otolaryngology

## 2024-03-04 ENCOUNTER — Encounter (INDEPENDENT_AMBULATORY_CARE_PROVIDER_SITE_OTHER): Payer: Self-pay | Admitting: Otolaryngology

## 2024-03-04 VITALS — BP 148/78 | HR 56 | Wt 148.0 lb

## 2024-03-04 DIAGNOSIS — H903 Sensorineural hearing loss, bilateral: Secondary | ICD-10-CM | POA: Diagnosis not present

## 2024-03-04 DIAGNOSIS — Z91198 Patient's noncompliance with other medical treatment and regimen for other reason: Secondary | ICD-10-CM

## 2024-03-04 DIAGNOSIS — J343 Hypertrophy of nasal turbinates: Secondary | ICD-10-CM

## 2024-03-04 DIAGNOSIS — G4733 Obstructive sleep apnea (adult) (pediatric): Secondary | ICD-10-CM

## 2024-03-04 DIAGNOSIS — F17210 Nicotine dependence, cigarettes, uncomplicated: Secondary | ICD-10-CM | POA: Diagnosis not present

## 2024-03-04 DIAGNOSIS — F17200 Nicotine dependence, unspecified, uncomplicated: Secondary | ICD-10-CM

## 2024-03-04 DIAGNOSIS — J3489 Other specified disorders of nose and nasal sinuses: Secondary | ICD-10-CM | POA: Diagnosis not present

## 2024-03-04 DIAGNOSIS — J342 Deviated nasal septum: Secondary | ICD-10-CM

## 2024-03-04 DIAGNOSIS — Z789 Other specified health status: Secondary | ICD-10-CM

## 2024-03-04 NOTE — Progress Notes (Signed)
 Dear Dr. Teresa, Here is my assessment for our mutual patient, [name]. Thank you for allowing me the opportunity to care for your patient. Please do not hesitate to contact me should you have any other questions. Sincerely, Dr. Eldora Blanch  Otolaryngology Clinic Note Referring provider: Dr. Teresa HPI:  [name] is a 68 y.o. female kindly referred by Dr. Teresa for evaluation of sinus issues and CPAP intolerance.  Initial visit (07/2023): Patient reports that she gets so congested some times that she cannot breathe through the night. She also reports some mucoid rhinorrhea which is bothersome. This is significantly bothersome for her because she uses a nasal CPAP and can't tolerate it due to nasal obstruction. It is disrupting her quality of life. Patient reports that this is on both sides, but more apparent when she lays on the right side. Also reports trouble breathing out of the nose during the day. She has had vivaer done with Dr. Llewellyn in Feb 2023 and it did not help.   She denies any history of frequent sinus infections -- last one reported several years ago which is reported as congestion, facial pressure, discolored drainage. No pain or pressure in face, discolored drainage. Smell is down some, however.   She has thought about inspire but she'd prefer nasal surgery first. Cannot tolerate CPAP masks and pulls it off even though she has tried several and changed settings.  No recent antibiotics or steroids.  She does report season allergies - uses nasacort , and it helps some. Tried allegra for several years, and it helped some.Has tried breathe rite strips, no significant help.SABRA   No prior allergy testing or sinus CT.  --------------------------------------------------------- 08/20/2023 Seen in follow up. She has tried nasal sprays but no improvement; she has considered her options and given how bothersome this is for her in general (obstruction even during day), she  would like to proceed with septoplasty after discussion. Continues to smoke.  --------------------------------------------------------- 11/09/2023 She is doing well from nasal standpoint - breathing improved, also improved from rhinorrhea standpoint. She does wish to address an ear concern -- left ear hearing loss - gradual, used them for 5 years but feels like hearing is going down. Intermittent fluttering, fullness Patient denies: ear pain, vertigo, drainage, tinnitus Patient additionally denies: deep pain in ear canal, eustachian tube symptoms such as popping/crackling, sensitive to pressure changes Patient also denies barotrauma, vestibular suppressant use, ototoxic medication use Prior ear surgery: no She did have a HT as below, which showed asymmetric SNHL ---------------------------------------------------------------- 03/04/2024 Returns for follow up. Doing fairly well from nasal standpoint with breathing improved. Ear continues to have hearing loss with stable intermittent fullness. We discussed her audiogram and MRI.   H&N Surgery: Vivaer 2023, ACDF (several years ago) Personal or FHx of bleeding dz or anesthesia difficulty: slow to wake up but no complications  GLP-1: Takes Semaglutide - weight loss AP/AC: no  PMHx: HTN, prior MVP (but recently said not present by Cardiology), OSA, MGUS, Depression, Periodic Paralysis, OSA on CPAP (AHI 51)  Tobacco: vapes and uses 7 cigarettes/day. Lives in [place], [place]  Independent Review of Additional Tests or Records:  Dr. Llewellyn (ENT) 10/08/2021 and 08/08/2021: noted nasal congestion and OSA on CPAP, using nasacort  and decongestant; no formal allergy testing, no CRS sx; Dx: Septal deviation, b/l ITH, NVR; then underwent Vivaer; no significant improvement [facility] (06/16/2023): referral notes reviewed and uploaded or available in chart in media tab: eval for Inspire and sleep issues, intolerance to  CPAP - significant nasal  congestion at night, using nasal mask + mouth tape ut has to remove CPAP at 3 AM; using nasacort , Noted 100% use of CPAP; Dx: CPAP intolerance, ref ENT for Inspire or septoplasty Sleep study 12/2015 reviewed independently - AHI 51.2, O2 nadir 79%, no central apneas Labs 08/21/2022 CBC and CMP: WBC 9.8, Plt 354, Hgb 13, CMP Na 140, No kidney disease 09/2023 Outside Audiogram ([place]) was independently reviewed and interpreted by me and it reveals Bilateral downsloping (mild/borderline to severe SNHL) with slight asymmetry on pure tones but WRT 75% AS and 100% AD 65 and 70dB HL  SNHL= Sensorineural hearing loss  MRI IAC 11/24/2023 independently interpreted with respect to ears: no IAC mass appreciated; very small b/l mastoid effusions;  02/2024 Audiogram was independently reviewed and interpreted by me and it reveals - b/l normal downsloping to mod/sev SNHL with asymmetric WRT - AD/AS 96%/80% at 65/70dB; A/A tymps   SNHL= Sensorineural hearing loss    PMH/Meds/All/SocHx/FamHx/ROS:   Past Medical History:  Diagnosis Date   Anxiety    Aortic atherosclerosis (HCC)    Seen on imaging studies   Cervical disc disease    Complication of anesthesia    prolonged sedation   COPD (chronic obstructive pulmonary disease) with emphysema (HCC)    Family history of adverse reaction to anesthesia    Mother N&V   GERD (gastroesophageal reflux disease)    HTN (hypertension)    Hyperlipidemia    Lumbar herniated disc 07/07/2021   MDD (major depressive disorder), recurrent, in partial remission (HCC)    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  Minimum saturation 79%-Dr. Chaya   Osteoarthritis    generalized   PONV (postoperative nausea and vomiting)    Pre-diabetes    Improved with weigth loss   Prediabetes     Tobacco dependence    Vision abnormalities      Past Surgical History:  Procedure Laterality Date   ANTERIOR AND POSTERIOR REPAIR N/A 10/16/2015   Procedure: ANTERIOR (CYSTOCELE) AND POSTERIOR REPAIR (RECTOCELE);  Surgeon: Bobie FORBES Cathlyn JAYSON Nikki, MD;  Location: [facility];  Service: Gynecology;  Laterality: N/A;   APPENDECTOMY  1974   BLADDER SUSPENSION N/A 10/16/2015   Procedure: TRANSVAGINAL TAPE (TVT) PROCEDURE exact midurethral sling;  Surgeon: Bobie FORBES Cathlyn JAYSON Nikki, MD;  Location: [facility];  Service: Gynecology;  Laterality: N/A;   BREAST BIOPSY     negative x 2    BREAST EXCISIONAL BIOPSY Left 1998   BREAST EXCISIONAL BIOPSY Right 1986   BUNIONECTOMY     w/hammer toe repair   CARPAL TUNNEL RELEASE Right    CERVICAL DISC SURGERY  12/2004   CT CTA CORONARY W/CA SCORE W/CM &/OR WO/CM  04/26/2019   Coronary calcium score 0.  No evidence of CAD.   CYSTO N/A 10/16/2015   Procedure: JONATHON;  Surgeon: Bobie FORBES Cathlyn JAYSON Nikki, MD;  Location: [facility];  Service: Gynecology;  Laterality: N/A;   CYSTOSCOPY N/A 02/26/2016   Procedure: CYSTOSCOPY;  Surgeon: Bobie FORBES Cathlyn JAYSON Nikki, MD;  Location: [facility];  Service: Gynecology;  Laterality: N/A;   KNEE ARTHROSCOPY     right knee x 2   MENISCUS REPAIR     right knee x 1   NASAL SEPTOPLASTY W/ TURBINOPLASTY  09/2023  TENS     upper and lower back   TONSILECTOMY, ADENOIDECTOMY, BILATERAL MYRINGOTOMY AND TUBES     TOTAL KNEE ARTHROPLASTY Right 04/09/2022   Procedure: TOTAL KNEE ARTHROPLASTY;  Surgeon: Duwayne Purchase, MD;  Location: [facility];  Service: Orthopedics;  Laterality: Right;   TOTAL VAGINAL HYSTERECTOMY  05/2004   adenomyosis, prolapse--ovaries remain   TRANSTHORACIC ECHOCARDIOGRAM  05/17/2019   EF 60 to 65%.  No R WMA.  Normal diastolic numbers.  No MR or MS.  No comment on MVP.  Mild aortic sclerosis with no stenosis.  Normal RAP.   TRANSVAGINAL TAPE (TVT) REMOVAL N/A 02/26/2016   Procedure: Lysis of mid urethral sling, Anterior  Colporrhaphy, Cystoscopy ;  Surgeon: Bobie FORBES Cathlyn JAYSON Nikki, MD;  Location: [facility];  Service: Gynecology;  Laterality: N/A;  first case/ move first case to follow this. Need 1 hour/    TUBAL LIGATION      Family History  Problem Relation Age of Onset   Heart failure Mother        congestive-presumably nonischemic cardiomyopathy   Diabetes Mellitus II Mother    Hypertension Mother    Coronary artery disease Father 61       MI   Diabetes Father    Heart attack Father 37       x2   Stroke Father 68   Hypertension Father    Hyperlipidemia Father    Diabetes Mellitus II Father    Cancer Brother 24       lung cancer   Cancer Brother        metastasized, unknown origin   Diabetes Brother    Colon cancer Paternal Grandmother    Breast cancer Cousin    Breast cancer Cousin      Social Connections: Unknown (11/17/2021)   Received from [employer]   Social Network    Social Network: Not on file      Current Outpatient Medications:    acetaminophen  (TYLENOL ) 500 MG tablet, Take 2 tablets (1,000 mg total) by mouth every 6 (six) hours as needed for mild pain (pain score 1-3)., Disp: 30 tablet, Rfl: 0   acetaZOLAMIDE  (DIAMOX ) 250 MG tablet, Take 1 tablet twice daily, Disp: 180 tablet, Rfl: 3   ALPRAZolam  (XANAX ) 0.5 MG tablet, Take 0.25 mg by mouth at bedtime., Disp: , Rfl:    atorvastatin (LIPITOR) 20 MG tablet, Take 20 mg by mouth daily., Disp: , Rfl:    brexpiprazole  (REXULTI ) 2 MG TABS tablet, Take 2 mg by mouth at bedtime., Disp: , Rfl:    Cholecalciferol  (VITAMIN D -3) 5000 UNITS TABS, Take 5,000 Units by mouth daily., Disp: , Rfl:    desvenlafaxine (PRISTIQ) 100 MG 24 hr tablet, Take 100 mg by mouth daily., Disp: , Rfl:    estradiol  (ESTRACE ) 0.1 MG/GM vaginal cream, Use 1/2 g vaginally every night for the first 2 weeks, then use 1/2 g vaginally two or three times per week as needed to maintain symptom relief.  With each application for the vagina, also place a pea size amount  to the vaginal opening and urethra., Disp: 42.5 g, Rfl: 2   ibuprofen  (ADVIL ) 200 MG tablet, Take 2 tablets (400 mg total) by mouth every 6 (six) hours as needed., Disp: 30 tablet, Rfl: 0   losartan  (COZAAR ) 50 MG tablet, Take 25 mg by mouth daily with supper., Disp: , Rfl:    magnesium  oxide (MAG-OX) 400 MG tablet, Take 400 mg by mouth at bedtime., Disp: , Rfl:  metoprolol  succinate (TOPROL -XL) 100 MG 24 hr tablet, Take 100 mg by mouth daily., Disp: , Rfl:    omega-3 acid ethyl esters (LOVAZA) 1 g capsule, Take 2 capsules by mouth 2 (two) times daily., Disp: , Rfl:    omeprazole (PRILOSEC) 20 MG capsule, Take 20 mg by mouth every morning., Disp: , Rfl:    sodium chloride  (OCEAN) 0.65 % SOLN nasal spray, Place 2 sprays into both nostrils every 4 (four) hours as needed for congestion., Disp: 30 mL, Rfl: 2   triamcinolone  (NASACORT ) 55 MCG/ACT AERO nasal inhaler, Place 2 sprays into the nose daily as needed (Allergies and congestion)., Disp: , Rfl:    Physical Exam:   BP (!) 148/78 (BP Location: Left Arm, Patient Position: Sitting, Cuff Size: Normal)   Pulse (!) 56   Wt 148 lb (67.1 kg)   LMP 03/07/2004 (Approximate)   SpO2 98%   BMI 27.07 kg/m   Salient findings:  CN II-XII intact EAC clear with TM intact with well pneumatized middle ear space Anterior rhinoscopy: Septum relatively midline; bilateral turbinates reduced; No lesions of oral cavity/oropharynx No obviously palpable neck masses/lymphadenopathy/thyromegaly No respiratory distress or stridor  Seprately Identifiable Procedures:  None today   Impression & Plans:  Earlyn Sylvan is a 68 y.o. female with:  1. Sensorineural hearing loss, bilateral   2. Hypertrophy of both inferior nasal turbinates   3. Nasal obstruction   4. Nasal septal deviation   5. Asymmetrical sensorineural hearing loss   6. OSA (obstructive sleep apnea)   7. Intolerance of continuous positive airway pressure (CPAP) ventilation   8. Tobacco use  disorder     S/p septo/turbs 10/05/2023 and doing well with improved nasal breathing For her asymmetric SNHL, would recommend continued amplification; MRI without noted retrocochlear lesions  - Continue flonase - If notices significant change in hearing, advised to call us   See below regarding exact medications prescribed this encounter including dosages and route: No orders of the defined types were placed in this encounter.   Thank you for allowing me the opportunity to care for your patient. Please do not hesitate to contact me should you have any other questions.  Sincerely, Eldora Blanch, MD Otolaryngologist (ENT), [facility] Phone: [phone] Fax: [phone]  03/04/2024, 5:23 PM   MDM:  Level 4: 99214 Complexity/Problems addressed: mod - multiple chronic problems Data complexity: mod - independent MRI interpretation - Morbidity: low  - Prescription Drug prescribed or managed: no

## 2024-03-28 DIAGNOSIS — C44329 Squamous cell carcinoma of skin of other parts of face: Secondary | ICD-10-CM | POA: Diagnosis not present

## 2024-04-07 ENCOUNTER — Other Ambulatory Visit: Payer: Self-pay | Admitting: Obstetrics and Gynecology

## 2024-04-07 DIAGNOSIS — Z1231 Encounter for screening mammogram for malignant neoplasm of breast: Secondary | ICD-10-CM

## 2024-04-08 ENCOUNTER — Ambulatory Visit
Admission: RE | Admit: 2024-04-08 | Discharge: 2024-04-08 | Disposition: A | Discharge status: Home / Self Care | Source: Ambulatory Visit | Attending: Obstetrics and Gynecology | Admitting: Obstetrics and Gynecology

## 2024-04-08 DIAGNOSIS — Z1231 Encounter for screening mammogram for malignant neoplasm of breast: Secondary | ICD-10-CM | POA: Diagnosis not present

## 2024-04-13 ENCOUNTER — Ambulatory Visit: Payer: Self-pay | Admitting: Obstetrics and Gynecology

## 2024-05-02 ENCOUNTER — Ambulatory Visit: Admitting: Obstetrics

## 2024-06-22 ENCOUNTER — Ambulatory Visit (HOSPITAL_BASED_OUTPATIENT_CLINIC_OR_DEPARTMENT_OTHER): Admission: RE | Admit: 2024-06-22 | Discharge: 2024-06-22 | Attending: Acute Care | Admitting: Acute Care

## 2024-06-22 DIAGNOSIS — F17210 Nicotine dependence, cigarettes, uncomplicated: Secondary | ICD-10-CM | POA: Diagnosis present

## 2024-06-22 DIAGNOSIS — Z122 Encounter for screening for malignant neoplasm of respiratory organs: Secondary | ICD-10-CM | POA: Diagnosis present

## 2024-06-22 DIAGNOSIS — Z87891 Personal history of nicotine dependence: Secondary | ICD-10-CM | POA: Insufficient documentation

## 2024-07-04 ENCOUNTER — Other Ambulatory Visit: Payer: Self-pay | Admitting: Acute Care

## 2024-07-04 DIAGNOSIS — Z122 Encounter for screening for malignant neoplasm of respiratory organs: Secondary | ICD-10-CM

## 2024-07-04 DIAGNOSIS — Z87891 Personal history of nicotine dependence: Secondary | ICD-10-CM

## 2024-07-04 DIAGNOSIS — F17210 Nicotine dependence, cigarettes, uncomplicated: Secondary | ICD-10-CM

## 2024-08-23 ENCOUNTER — Inpatient Hospital Stay: Payer: Medicare Other

## 2024-09-02 ENCOUNTER — Ambulatory Visit: Payer: Medicare Other | Admitting: Hematology and Oncology

## 2024-10-25 ENCOUNTER — Ambulatory Visit (HOSPITAL_BASED_OUTPATIENT_CLINIC_OR_DEPARTMENT_OTHER)

## 2025-01-04 ENCOUNTER — Encounter: Admitting: Obstetrics and Gynecology

## 2025-01-23 ENCOUNTER — Telehealth: Admitting: Family Medicine

## 2025-01-25 ENCOUNTER — Telehealth: Admitting: Family Medicine
